# Patient Record
Sex: Female | Born: 1950
Health system: Southern US, Community
[De-identification: ages and names within clinical notes are randomized; demographics above are authoritative.]

## PROBLEM LIST (undated history)

## (undated) DIAGNOSIS — F419 Anxiety disorder, unspecified: Secondary | ICD-10-CM

## (undated) DIAGNOSIS — K219 Gastro-esophageal reflux disease without esophagitis: Secondary | ICD-10-CM

## (undated) DIAGNOSIS — K5790 Diverticulosis of intestine, part unspecified, without perforation or abscess without bleeding: Secondary | ICD-10-CM

## (undated) DIAGNOSIS — T7840XA Allergy, unspecified, initial encounter: Secondary | ICD-10-CM

## (undated) DIAGNOSIS — I493 Ventricular premature depolarization: Secondary | ICD-10-CM

## (undated) DIAGNOSIS — Z9289 Personal history of other medical treatment: Secondary | ICD-10-CM

## (undated) DIAGNOSIS — R002 Palpitations: Secondary | ICD-10-CM

## (undated) DIAGNOSIS — I341 Nonrheumatic mitral (valve) prolapse: Secondary | ICD-10-CM

## (undated) DIAGNOSIS — M810 Age-related osteoporosis without current pathological fracture: Secondary | ICD-10-CM

## (undated) DIAGNOSIS — K6289 Other specified diseases of anus and rectum: Secondary | ICD-10-CM

## (undated) DIAGNOSIS — M1990 Unspecified osteoarthritis, unspecified site: Secondary | ICD-10-CM

## (undated) DIAGNOSIS — M4800 Spinal stenosis, site unspecified: Secondary | ICD-10-CM

## (undated) DIAGNOSIS — R011 Cardiac murmur, unspecified: Secondary | ICD-10-CM

## (undated) DIAGNOSIS — M353 Polymyalgia rheumatica: Secondary | ICD-10-CM

## (undated) DIAGNOSIS — K589 Irritable bowel syndrome without diarrhea: Secondary | ICD-10-CM

## (undated) HISTORY — DX: Unspecified osteoarthritis, unspecified site: M19.90

## (undated) HISTORY — DX: Personal history of other medical treatment: Z92.89

## (undated) HISTORY — DX: Spinal stenosis, site unspecified: M48.00

## (undated) HISTORY — DX: Anxiety disorder, unspecified: F41.9

## (undated) HISTORY — DX: Palpitations: R00.2

## (undated) HISTORY — DX: Cardiac murmur, unspecified: R01.1

## (undated) HISTORY — PX: COLONOSCOPY: SHX174

## (undated) HISTORY — PX: DILATION AND CURETTAGE OF UTERUS: SHX78

## (undated) HISTORY — DX: Diverticulosis of intestine, part unspecified, without perforation or abscess without bleeding: K57.90

## (undated) HISTORY — DX: Other specified diseases of anus and rectum: K62.89

## (undated) HISTORY — DX: Age-related osteoporosis without current pathological fracture: M81.0

## (undated) HISTORY — DX: Polymyalgia rheumatica: M35.3

## (undated) HISTORY — DX: Irritable bowel syndrome, unspecified: K58.9

## (undated) HISTORY — DX: Gastro-esophageal reflux disease without esophagitis: K21.9

## (undated) HISTORY — DX: Allergy, unspecified, initial encounter: T78.40XA

## (undated) HISTORY — DX: Nonrheumatic mitral (valve) prolapse: I34.1

## (undated) HISTORY — PX: TUBAL LIGATION: SHX77

## (undated) HISTORY — PX: TONSILLECTOMY: SUR1361

## (undated) HISTORY — DX: Ventricular premature depolarization: I49.3

---

## 1998-11-25 ENCOUNTER — Other Ambulatory Visit: Admission: RE | Admit: 1998-11-25 | Discharge: 1998-11-25 | Payer: Self-pay | Admitting: Gynecology

## 1999-12-22 ENCOUNTER — Other Ambulatory Visit: Admission: RE | Admit: 1999-12-22 | Discharge: 1999-12-22 | Payer: Self-pay | Admitting: Gynecology

## 2001-03-20 ENCOUNTER — Other Ambulatory Visit: Admission: RE | Admit: 2001-03-20 | Discharge: 2001-03-20 | Payer: Self-pay | Admitting: Gynecology

## 2002-08-29 ENCOUNTER — Other Ambulatory Visit: Admission: RE | Admit: 2002-08-29 | Discharge: 2002-08-29 | Payer: Self-pay | Admitting: Gynecology

## 2003-08-20 ENCOUNTER — Encounter: Payer: Self-pay | Admitting: Cardiology

## 2003-09-01 ENCOUNTER — Ambulatory Visit (HOSPITAL_COMMUNITY): Admission: RE | Admit: 2003-09-01 | Discharge: 2003-09-01 | Payer: Self-pay | Admitting: Cardiology

## 2003-09-01 ENCOUNTER — Encounter: Payer: Self-pay | Admitting: Cardiology

## 2004-03-04 ENCOUNTER — Other Ambulatory Visit: Admission: RE | Admit: 2004-03-04 | Discharge: 2004-03-04 | Payer: Self-pay | Admitting: Gynecology

## 2005-03-09 ENCOUNTER — Other Ambulatory Visit: Admission: RE | Admit: 2005-03-09 | Discharge: 2005-03-09 | Payer: Self-pay | Admitting: Gynecology

## 2005-07-21 ENCOUNTER — Ambulatory Visit: Payer: Self-pay | Admitting: Cardiology

## 2005-12-31 ENCOUNTER — Ambulatory Visit: Payer: Self-pay | Admitting: Family Medicine

## 2006-02-23 ENCOUNTER — Other Ambulatory Visit: Admission: RE | Admit: 2006-02-23 | Discharge: 2006-02-23 | Payer: Self-pay | Admitting: Gynecology

## 2006-08-10 ENCOUNTER — Ambulatory Visit: Payer: Self-pay | Admitting: Cardiology

## 2006-09-28 ENCOUNTER — Ambulatory Visit: Payer: Self-pay | Admitting: Internal Medicine

## 2007-09-12 ENCOUNTER — Ambulatory Visit: Payer: Self-pay | Admitting: Cardiology

## 2007-11-16 LAB — CONVERTED CEMR LAB: Pap Smear: NORMAL

## 2008-03-13 ENCOUNTER — Ambulatory Visit: Payer: Self-pay | Admitting: Internal Medicine

## 2008-03-13 LAB — CONVERTED CEMR LAB
ALT: 14 units/L (ref 0–35)
AST: 24 units/L (ref 0–37)
Albumin: 3.6 g/dL (ref 3.5–5.2)
Alkaline Phosphatase: 28 units/L — ABNORMAL LOW (ref 39–117)
BUN: 14 mg/dL (ref 6–23)
Basophils Absolute: 0 10*3/uL (ref 0.0–0.1)
Basophils Relative: 0.1 % (ref 0.0–1.0)
Bilirubin Urine: NEGATIVE
Bilirubin, Direct: 0.1 mg/dL (ref 0.0–0.3)
Blood in Urine, dipstick: NEGATIVE
CO2: 34 meq/L — ABNORMAL HIGH (ref 19–32)
Calcium: 9.7 mg/dL (ref 8.4–10.5)
Chloride: 104 meq/L (ref 96–112)
Cholesterol: 188 mg/dL (ref 0–200)
Creatinine, Ser: 0.9 mg/dL (ref 0.4–1.2)
Eosinophils Absolute: 0.4 10*3/uL (ref 0.0–0.7)
Eosinophils Relative: 7.8 % — ABNORMAL HIGH (ref 0.0–5.0)
GFR calc Af Amer: 84 mL/min
GFR calc non Af Amer: 69 mL/min
Glucose, Bld: 84 mg/dL (ref 70–99)
Glucose, Urine, Semiquant: NEGATIVE
HCT: 39.1 % (ref 36.0–46.0)
HDL: 47.9 mg/dL (ref >39.0)
Hemoglobin: 13.3 g/dL (ref 12.0–15.0)
Ketones, urine, test strip: NEGATIVE
LDL Cholesterol: 119 mg/dL — ABNORMAL HIGH (ref 0–99)
Lymphocytes Relative: 24.2 % (ref 12.0–46.0)
MCHC: 34 g/dL (ref 30.0–36.0)
MCV: 90.1 fL (ref 78.0–100.0)
Monocytes Absolute: 0.3 10*3/uL (ref 0.1–1.0)
Monocytes Relative: 6.8 % (ref 3.0–12.0)
Neutro Abs: 2.7 10*3/uL (ref 1.4–7.7)
Neutrophils Relative %: 61.1 % (ref 43.0–77.0)
Nitrite: NEGATIVE
Platelets: 233 10*3/uL (ref 150–400)
Potassium: 4.3 meq/L (ref 3.5–5.1)
Protein, U semiquant: NEGATIVE
RBC: 4.33 M/uL (ref 3.87–5.11)
RDW: 12.1 % (ref 11.5–14.6)
Sodium: 142 meq/L (ref 135–145)
Specific Gravity, Urine: 1.01
TSH: 1.3 microintl units/mL (ref 0.35–5.50)
Total Bilirubin: 0.6 mg/dL (ref 0.3–1.2)
Total CHOL/HDL Ratio: 3.9
Total Protein: 6.8 g/dL (ref 6.0–8.3)
Triglycerides: 107 mg/dL (ref 0–149)
Urobilinogen, UA: 0.2
VLDL: 21 mg/dL (ref 0–40)
WBC Urine, dipstick: NEGATIVE
WBC: 4.5 10*3/uL (ref 4.5–10.5)
pH: 7

## 2008-03-20 ENCOUNTER — Other Ambulatory Visit: Admission: RE | Admit: 2008-03-20 | Discharge: 2008-03-20 | Payer: Self-pay | Admitting: Gynecology

## 2008-03-21 ENCOUNTER — Ambulatory Visit: Payer: Self-pay | Admitting: Internal Medicine

## 2008-03-21 DIAGNOSIS — Z8739 Personal history of other diseases of the musculoskeletal system and connective tissue: Secondary | ICD-10-CM | POA: Insufficient documentation

## 2008-04-24 ENCOUNTER — Ambulatory Visit: Payer: Self-pay | Admitting: Family Medicine

## 2008-04-24 ENCOUNTER — Encounter: Payer: Self-pay | Admitting: Internal Medicine

## 2008-05-03 ENCOUNTER — Telehealth: Payer: Self-pay | Admitting: Internal Medicine

## 2008-05-13 ENCOUNTER — Ambulatory Visit: Payer: Self-pay | Admitting: Internal Medicine

## 2008-05-22 ENCOUNTER — Ambulatory Visit: Payer: Self-pay | Admitting: Internal Medicine

## 2008-11-22 ENCOUNTER — Emergency Department (HOSPITAL_BASED_OUTPATIENT_CLINIC_OR_DEPARTMENT_OTHER): Admission: EM | Admit: 2008-11-22 | Discharge: 2008-11-22 | Payer: Self-pay | Admitting: Emergency Medicine

## 2009-01-08 ENCOUNTER — Encounter: Payer: Self-pay | Admitting: Internal Medicine

## 2009-03-01 DIAGNOSIS — Z8679 Personal history of other diseases of the circulatory system: Secondary | ICD-10-CM | POA: Insufficient documentation

## 2009-03-01 DIAGNOSIS — R002 Palpitations: Secondary | ICD-10-CM | POA: Insufficient documentation

## 2009-05-16 ENCOUNTER — Ambulatory Visit: Payer: Self-pay | Admitting: Cardiology

## 2009-06-18 ENCOUNTER — Ambulatory Visit: Payer: Self-pay | Admitting: Family Medicine

## 2009-07-02 ENCOUNTER — Telehealth: Payer: Self-pay | Admitting: Internal Medicine

## 2009-12-15 ENCOUNTER — Ambulatory Visit: Payer: Self-pay | Admitting: Internal Medicine

## 2009-12-31 ENCOUNTER — Encounter: Payer: Self-pay | Admitting: Internal Medicine

## 2010-02-12 ENCOUNTER — Encounter: Payer: Self-pay | Admitting: Internal Medicine

## 2010-10-15 ENCOUNTER — Encounter: Payer: Self-pay | Admitting: Internal Medicine

## 2010-11-02 ENCOUNTER — Ambulatory Visit: Payer: Self-pay | Admitting: Internal Medicine

## 2010-11-15 LAB — HM MAMMOGRAPHY: HM Mammogram: NORMAL

## 2010-12-15 NOTE — Letter (Signed)
Summary: [facility]   Imported By: Maryln Gottron 10/22/2010 09:47:50  _____________________________________________________________________  External Attachment:    Type:   Image     Comment:   External Document

## 2010-12-15 NOTE — Assessment & Plan Note (Signed)
Summary: sinus infection/dm   Vital Signs:  Patient profile:   60 year old female Weight:      129 pounds BMI:     23.30 Temp:     98 degrees F Pulse rate:   60 / minute Resp:     12 per minute BP sitting:   96 / 60  (left arm)  Vitals Entered By: Gladis Riffle, RN (December 15, 2009 11:03 AM) CC: c/o sinus infection, began with sore throat 1/27 and continues sore thrat with teet hurting and nasal congestion Is Patient Diabetic? No   Primary Care Provider:  Birdie Sons MD  CC:  c/o sinus infection and began with sore throat 1/27 and continues sore thrat with teet hurting and nasal congestion.  History of Present Illness: 4 day hx of URI sxs started with ST now with left sided sinus pressure and teeth pain ears ache.  no fever or chills sick contacts; none  All other systems reviewed and were negative   Preventive Screening-Counseling & Management  Alcohol-Tobacco     Smoking Status: never  Current Medications (verified): 1)  Metoprolol Succinate 50 Mg Xr24h-Tab (Metoprolol Succinate) .... 1/2 Tab Once Daily 2)  Zyrtec Allergy 10 Mg  Tabs (Cetirizine Hcl) .... One By Mouth Daily 3)  Aspirin 81 Mg  Tbec (Aspirin) .... Once Daily 4)  Claritin 10 Mg Tabs (Loratadine) .... As Needed 5)  Fish Oil 1000 Mg Caps (Omega-3 Fatty Acids) .[name].. 1 Cap Once Daily 6)  Vitamin D3 2000 Unit Caps (Cholecalciferol) .[name].. 1 Tab Once Daily 7)  Diclofenac Sodium 75 Mg Tbec (Diclofenac Sodium) .[name].. 1 Tab Once Daily 8)  Mucinex 600 Mg Xr12h-Tab (Guaifenesin) .... Once Daily  Allergies: 1)  ! Pcn 2)  ! Ceclor 3)  ! Sulfa  Comments:  Nurse/Medical Assistant: c/o sinus infection, began on 1/27 with sore throat--continues sore throat with nasal congestion and tooth pain  The patient's medications and allergies were reviewed with the patient and were updated in the Medication and Allergy Lists. Gladis Riffle, RN (December 15, 2009 11:09 AM)  Past History:  Past Medical History: Last updated:  03/01/2009 MITRAL VALVE PROLAPSE, HX OF (ICD-V12.50) PALPITATIONS (ICD-785.1) OTHER ACUTE REACTIONS TO STRESS (ICD-308.3) POLYMYALGIA RHEUMATICA (ICD-725) PREVENTIVE HEALTH CARE (ICD-V70.0) Allergies sinusitis   Past Surgical History: Last updated: 03/21/2008 Denies surgical history d and c age 12  Family History: Last updated: 03/22/2008 mother --MAI, OSA  Social History: Last updated: 03/01/2009 Never Smoked Regular exercise-yes Married..full time homemaker Alcohol Use - no  Risk Factors: Exercise: yes (07/05/2007)  Risk Factors: Smoking Status: never (12/15/2009)  Physical Exam  General:  alert and well-developed.   Head:  normocephalic and atraumatic.   Nose:  no external deformity and no external erythema.   Neck:  No deformities, masses, or tenderness noted. Lungs:  Normal respiratory effort, chest expands symmetrically. Lungs are clear to auscultation, no crackles or wheezes.   Impression & Recommendations:  Problem # 1:  ACUTE SINUSITIS, UNSPECIFIED (ICD-461.9) by history she says zpack has worked before (she knows this is not my favorite ABX) side effects discussed call for any increase or persistent sxs Her updated medication list for this problem includes:    Mucinex 600 Mg Xr12h-tab (Guaifenesin) ..... Once daily  Complete Medication List: 1)  Metoprolol Succinate 50 Mg Xr24h-tab (Metoprolol succinate) .... 1/2 tab once daily 2)  Zyrtec Allergy 10 Mg Tabs (Cetirizine hcl) .... One by mouth daily 3)  Aspirin 81 Mg Tbec (Aspirin) .... Once daily 4)  Fish Oil 1000 Mg Caps (Omega-3 fatty acids) .[name].. 1 cap once daily 5)  Vitamin D3 2000 Unit Caps (Cholecalciferol) .[name].. 1 tab once daily 6)  Diclofenac Sodium 75 Mg Tbec (Diclofenac sodium) .[name].. 1 tab once daily 7)  Mucinex 600 Mg Xr12h-tab (Guaifenesin) .... Once daily Prescriptions: AZITHROMYCIN 250 MG  TABS (AZITHROMYCIN) 2 by  mouth today and then 1 daily for 4 days  #6 x 0   Entered and Authorized  by:   Birdie Sons MD   Signed by:   Birdie Sons MD on 12/15/2009   Method used:   Electronically to        [facility] [phone]* (retail)       [address]       [place], [place]  [address]       Ph: [phone]       Fax: [phone]   RxID:   [id number]

## 2010-12-15 NOTE — Letter (Signed)
Summary: [facility]   Imported By: Maryln Gottron 01/12/2010 09:37:06  _____________________________________________________________________  External Attachment:    Type:   Image     Comment:   External Document

## 2010-12-17 NOTE — Assessment & Plan Note (Signed)
Summary: sinus inf/cjr   Vital Signs:  Patient profile:   60 year old female Height:      62.5 inches Weight:      129 pounds BMI:     23.30 O2 Sat:      98 % on Room air Temp:     98.5 degrees F oral Pulse rate:   72 / minute BP sitting:   100 / 70  (left arm) Cuff size:   regular  Vitals Entered By: Romualdo Bolk, CMA (AAMA) (November 02, 2010 10:30 AM)  O2 Flow:  Room air CC: Sinus pressure, congestion and coughing   Primary Care Provider:  Birdie Sons MD  CC:  Sinus pressure and congestion and coughing.  History of Present Illness: patient was declined as a work in for evaluation of upper respiratory symptoms.  No two-day history of sore throat nasal congestion drainage and cough productive of brown sputum. Thomas subjective fever but has experienced chills.  Had positive sick exposure approximately 1 week ago. denies shortness or breath or wheezing.  Is taking over-the-counter Mucinex.  Requests flu shot.  No other complaints.  No alleviating or exacerbating factors.  Preventive Screening-Counseling & Management  Alcohol-Tobacco     Smoking Status: never  Caffeine-Diet-Exercise     Does Patient Exercise: yes  Current Medications (verified): 1)  Metoprolol Succinate 50 Mg Xr24h-Tab (Metoprolol Succinate) .... 1/2 Tab Once Daily 2)  Zyrtec Allergy 10 Mg  Tabs (Cetirizine Hcl) .... One By Mouth Daily 3)  Aspirin 81 Mg  Tbec (Aspirin) .... Once Daily 4)  Fish Oil 1000 Mg Caps (Omega-3 Fatty Acids) .[name].. 1 Cap Once Daily 5)  Diclofenac Sodium 75 Mg Tbec (Diclofenac Sodium) .[name].. 1 Tab Once Daily 6)  Mucinex 600 Mg Xr12h-Tab (Guaifenesin) .... Once Daily 7)  Vitamin C 500 Mg  Tabs (Ascorbic Acid)  Allergies (verified): 1)  ! Pcn 2)  ! Ceclor 3)  ! Sulfa  Review of Systems      See HPI  Physical Exam  General:  Well-developed,well-nourished,in no acute distress; alert,appropriate and cooperative throughout examination Head:  Normocephalic and atraumatic  without obvious abnormalities. No apparent alopecia or balding. Eyes:  pupils equal, pupils round, corneas and lenses clear, and no injection.   Ears:  External ear exam shows no significant lesions or deformities.  Otoscopic examination reveals clear canals, tympanic membranes are intact bilaterally without bulging, retraction, inflammation or discharge. Hearing is grossly normal bilaterally. Nose:  no external deformity.  +nasal mucosal swelling and clear drainage noted.  Mouth:  Oral mucosa and oropharynx without lesions or exudates.  Teeth in good repair. Neck:  No deformities, masses, or tenderness noted. Lungs:  Normal respiratory effort, chest expands symmetrically. Lungs are clear to auscultation, no crackles or wheezes. Skin:  turgor normal, color normal, and no rashes.     Impression & Recommendations:  Problem # 1:  URI (ICD-465.9) Suspect possible viral etiology. Discussed otc symptomatic tx and increased fluids. Given prescription abx to hold. Begin if symptoms do not improve after total of 8-10 days. Followup if no improvement or worsening. Pt requested and was given flu vaccine. AF and without contraindication.  Complete Medication List: 1)  Metoprolol Succinate 50 Mg Xr24h-tab (Metoprolol succinate) .... 1/2 tab once daily 2)  Zyrtec Allergy 10 Mg Tabs (Cetirizine hcl) .... One by mouth daily 3)  Aspirin 81 Mg Tbec (Aspirin) .... Once daily 4)  Fish Oil 1000 Mg Caps (Omega-3 fatty acids) .[name].. 1 cap once daily 5)  Diclofenac Sodium 75 Mg Tbec (Diclofenac sodium) .[name].. 1 tab once daily 6)  Mucinex 600 Mg Xr12h-tab (Guaifenesin) .... Once daily 7)  Vitamin C 500 Mg Tabs (Ascorbic acid) 8)  Zithromax Z-pak 250 Mg Tabs (Azithromycin) .... As directed Prescriptions: ZITHROMAX Z-PAK 250 MG TABS (AZITHROMYCIN) as directed  #1 x 0   Entered and Authorized by:   Edwyna Perfect MD   Signed by:   Edwyna Perfect MD on 11/02/2010   Method used:   Print then Give to Patient   RxID:    [id number]    Orders Added: 1)  Est. Patient Level III [56213]

## 2011-01-20 ENCOUNTER — Encounter (INDEPENDENT_AMBULATORY_CARE_PROVIDER_SITE_OTHER): Payer: Self-pay | Admitting: *Deleted

## 2011-01-20 ENCOUNTER — Other Ambulatory Visit: Payer: Self-pay

## 2011-01-20 ENCOUNTER — Other Ambulatory Visit: Payer: Self-pay | Admitting: Internal Medicine

## 2011-01-20 DIAGNOSIS — Z0000 Encounter for general adult medical examination without abnormal findings: Secondary | ICD-10-CM

## 2011-01-20 DIAGNOSIS — E785 Hyperlipidemia, unspecified: Secondary | ICD-10-CM

## 2011-01-20 LAB — CBC WITH DIFFERENTIAL/PLATELET
Basophils Absolute: 0 10*3/uL (ref 0.0–0.1)
Basophils Relative: 0.4 % (ref 0.0–3.0)
Eosinophils Absolute: 0.2 10*3/uL (ref 0.0–0.7)
Eosinophils Relative: 2.5 % (ref 0.0–5.0)
HCT: 39.1 % (ref 36.0–46.0)
Hemoglobin: 13.2 g/dL (ref 12.0–15.0)
Lymphocytes Relative: 21.6 % (ref 12.0–46.0)
Lymphs Abs: 1.4 10*3/uL (ref 0.7–4.0)
MCHC: 33.8 g/dL (ref 30.0–36.0)
MCV: 93.2 fl (ref 78.0–100.0)
Monocytes Absolute: 0.6 10*3/uL (ref 0.1–1.0)
Monocytes Relative: 8.6 % (ref 3.0–12.0)
Neutro Abs: 4.4 10*3/uL (ref 1.4–7.7)
Neutrophils Relative %: 66.9 % (ref 43.0–77.0)
Platelets: 271 10*3/uL (ref 150.0–400.0)
RBC: 4.19 Mil/uL (ref 3.87–5.11)
RDW: 14.4 % (ref 11.5–14.6)
WBC: 6.6 10*3/uL (ref 4.5–10.5)

## 2011-01-20 LAB — BASIC METABOLIC PANEL
BUN: 15 mg/dL (ref 6–23)
Calcium: 9.8 mg/dL (ref 8.4–10.5)
Creatinine, Ser: 0.7 mg/dL (ref 0.4–1.2)
GFR: 96.29 mL/min (ref >60.00)
Glucose, Bld: 85 mg/dL (ref 70–99)
Potassium: 4.7 mEq/L (ref 3.5–5.1)

## 2011-01-20 LAB — HEPATIC FUNCTION PANEL
AST: 28 U/L (ref 0–37)
Albumin: 3.9 g/dL (ref 3.5–5.2)
Total Bilirubin: 0.6 mg/dL (ref 0.3–1.2)

## 2011-01-20 LAB — URINALYSIS
Ketones, ur: NEGATIVE
Leukocytes, UA: NEGATIVE
Nitrite: NEGATIVE
Specific Gravity, Urine: 1.02 (ref 1.000–1.030)
Urobilinogen, UA: 0.2 (ref 0.0–1.0)

## 2011-01-20 LAB — LIPID PANEL
Cholesterol: 232 mg/dL — ABNORMAL HIGH (ref 0–200)
HDL: 61.5 mg/dL (ref >39.00)
Triglycerides: 145 mg/dL (ref 0.0–149.0)
VLDL: 29 mg/dL (ref 0.0–40.0)

## 2011-01-20 LAB — TSH: TSH: 1.24 u[IU]/mL (ref 0.35–5.50)

## 2011-01-27 ENCOUNTER — Encounter: Payer: Self-pay | Admitting: Internal Medicine

## 2011-02-02 ENCOUNTER — Encounter: Payer: Self-pay | Admitting: Internal Medicine

## 2011-02-03 ENCOUNTER — Encounter: Payer: Self-pay | Admitting: Internal Medicine

## 2011-02-03 ENCOUNTER — Ambulatory Visit (INDEPENDENT_AMBULATORY_CARE_PROVIDER_SITE_OTHER): Payer: BC Managed Care – PPO | Admitting: Internal Medicine

## 2011-02-03 VITALS — BP 118/76 | HR 64 | Temp 97.8°F | Ht 63.5 in | Wt 127.0 lb

## 2011-02-03 DIAGNOSIS — I059 Rheumatic mitral valve disease, unspecified: Secondary | ICD-10-CM

## 2011-02-03 DIAGNOSIS — Z0000 Encounter for general adult medical examination without abnormal findings: Secondary | ICD-10-CM

## 2011-02-03 DIAGNOSIS — I341 Nonrheumatic mitral (valve) prolapse: Secondary | ICD-10-CM

## 2011-02-03 MED ORDER — METOPROLOL SUCCINATE ER 50 MG PO TB24: 50.0000 mg | ORAL_TABLET | Freq: Every day | ORAL | Status: DC

## 2011-02-03 NOTE — Progress Notes (Signed)
  Subjective:    Patient ID: Brenda Hudson, female    DOB: 1951-05-29, 60 y.o.   MRN: [id number]  HPI  CPX  Back pain---has had several injections---? Relief  She describes waves of "fatigue". Has periods where she feels like she could "fall asleep anytime".  Past Medical History  Diagnosis Date  . MVP (mitral valve prolapse)   . Palpitations   . Polymyalgia rheumatica   . Allergy   . Anxiety    Past Surgical History  Procedure Date  . Dilation and curettage of uterus     reports that she has never smoked. She does not have any smokeless tobacco history on file. She reports that she does not drink alcohol. Her drug history not on file. family history includes Sleep apnea in her mother. Allergies  Allergen Reactions  . Cefaclor     REACTION: rash  . Penicillins     REACTION: rash  . Sulfonamide Derivatives     REACTION: swelling     Review of Systems  patient denies chest pain, shortness of breath, orthopnea. Denies lower extremity edema, abdominal pain, change in appetite, change in bowel movements. Patient denies rashes, musculoskeletal complaints. No other specific complaints in a complete review of systems.      Objective:   Physical Exam  Well-developed well-nourished female in no acute distress. HEENT exam atraumatic, normocephalic, extraocular muscles are intact. Neck is supple. No jugular venous distention no thyromegaly. Chest clear to auscultation without increased work of breathing. Cardiac exam S1 and S2 are regular. Abdominal exam active bowel sounds, soft, nontender. Extremities no edema. Neurologic exam she is alert without any motor sensory deficits. Gait is normal.        Assessment & Plan:

## 2011-02-10 ENCOUNTER — Encounter: Payer: Self-pay | Admitting: Gastroenterology

## 2011-02-25 ENCOUNTER — Encounter: Payer: Self-pay | Admitting: Internal Medicine

## 2011-02-26 ENCOUNTER — Encounter: Payer: Self-pay | Admitting: *Deleted

## 2011-03-01 ENCOUNTER — Ambulatory Visit (INDEPENDENT_AMBULATORY_CARE_PROVIDER_SITE_OTHER): Payer: BC Managed Care – PPO | Admitting: Cardiology

## 2011-03-01 ENCOUNTER — Telehealth: Payer: Self-pay | Admitting: Cardiology

## 2011-03-01 ENCOUNTER — Encounter: Payer: Self-pay | Admitting: Cardiology

## 2011-03-01 VITALS — BP 115/70 | HR 51 | Resp 12 | Ht 63.0 in | Wt 126.0 lb

## 2011-03-01 DIAGNOSIS — R002 Palpitations: Secondary | ICD-10-CM

## 2011-03-01 DIAGNOSIS — Z8679 Personal history of other diseases of the circulatory system: Secondary | ICD-10-CM

## 2011-03-01 MED ORDER — METOPROLOL SUCCINATE ER 50 MG PO TB24: 50.0000 mg | ORAL_TABLET | Freq: Every day | ORAL | Status: DC

## 2011-03-01 NOTE — Progress Notes (Signed)
   Patient ID: Brenda Hudson, female    DOB: May 15, 1951, 60 y.o.   MRN: [id number]  HPI  Brenda Hudson comes in today for E and M of her palpitations. They are still mostly well controlled by Toprol and exercise. She has no angina or chest pain. She is running about 16-20 miles a week. She is starting to have some back issues. I have encouraged her to walk.  I reviewed her lipids with her. She has a high HDL and a low TC/HDL ratio. No indication for pharmacologic treatment.  EKG shows SB otherwise normal.    Review of Systems  All other systems reviewed and are negative.      Physical Exam  Nursing note and vitals reviewed. Constitutional: She is oriented to person, place, and time. She appears well-developed and well-nourished.  HENT:  Head: Atraumatic.  Eyes: EOM are normal. Pupils are equal, round, and reactive to light.  Neck: No JVD present. No tracheal deviation present. No thyromegaly present.  Cardiovascular: Normal rate, regular rhythm, normal heart sounds and intact distal pulses.   No murmur heard. Pulmonary/Chest: Effort normal and breath sounds normal.  Abdominal: Soft. Bowel sounds are normal.  Musculoskeletal: She exhibits no edema.  Neurological: She is alert and oriented to person, place, and time.  Skin: Skin is warm and dry.  Psychiatric: She has a normal mood and affect.

## 2011-03-01 NOTE — Patient Instructions (Signed)
Your physician recommends that you schedule a follow-up appointment in: 1 year with Dr. Wall  

## 2011-03-01 NOTE — Telephone Encounter (Signed)
[facility] re [facility]

## 2011-03-02 NOTE — Telephone Encounter (Signed)
Clarified dose

## 2011-03-30 NOTE — Assessment & Plan Note (Signed)
[facility]                            CARDIOLOGY OFFICE NOTE   NAME:Brenda Hudson, Brenda Hudson                   MRN:          [id number]  DATE:09/12/2007                            DOB:          Jul 12, 1951    HISTORY:  Brenda Hudson returns today for further management of her history  of mitral valve prolapse and tachy palpitations.  She remains on Toprol  XL 50 mg daily.  She has intermittent palpitations but they are not  prolonged or life-changing.   She continues to be extremely active, riding horses and running.  She  generally runs three to four miles at a time.  She has had two episodes  of chest tightness, going up into her neck.  There were no other  symptoms.  This seems to happen in really hot weather.  It does not  happen most of the time.  In fact, it happens very rarely.   She does not recall what her lipids are.  They have not been checked in  four years.  I have advised her to have them checked.   CURRENT MEDICATIONS:  1. Aspirin 81 mg daily.  2. Fish oil one p.o. twice daily.  3. Zyrtec.  4. Multivitamin.  5. Toprol XL 50 mg daily.   PHYSICAL EXAMINATION:  VITAL SIGNS:  Blood pressure today 110/78, pulse  48, sinus bradycardia.  Her electrocardiogram is stable.  Weight 128  pounds.  HEENT:  Unchanged.  NECK:  Carotid upstrokes are equal bilaterally without bruits.  No  jugular venous distention.  Thyroid is not enlarged.  Trachea is  midline.  LUNGS:  Clear.  HEART:  A non-displaced PMI.  She has normal S1 and S2 without click.  ABDOMEN:  Soft, good bowel sounds.  EXTREMITIES:  No edema.  Pulses intact.  NEUROLOGIC:  Intact.   [name] is doing extremely well.  These two episodes of chest  tightness seem to be weather or humidity-related.  I have advised her  that if those start happening on a more regular basis, to please let me  know.  We could do a stress test, but I do not think that is indicated  at present.  She does need  her fasting lipids checked.  We have renewed  her Toprol for one year.   FOLLOWUP:  I will see her back in one year.    Thomas C. Daleen Squibb, MD, [facility]  Electronically Signed   TCW/MedQ  DD: 09/12/2007  DT: 09/12/2007  Job #: [id number]

## 2011-04-02 NOTE — Assessment & Plan Note (Signed)
[facility]                              CARDIOLOGY OFFICE NOTE   NAME:Hudson, Brenda BOHL                   MRN:          [id number]  DATE:08/10/2006                            DOB:          04-14-1951    Brenda Hudson returns today for management of her mitral valve prolapse and  palpitations. She has done well on Toprol-XL 50 mg a day and needs it  renewed. She has occasional palpitations that are unpredictable throughout  the day and night, but nothing that keeps her from being extremely active.  She has gotten into riding horses with her daughter and seems to be  thoroughly enjoying it.   She has no other complaints. She has not had a regular visit with her  primary doctor in some time, and I have encouraged her to get up with Dr.  Cato Mulligan.   Her only medications are:  1. Aspirin 81 mg every night.  2. Fish oil b.i.d.  3. Calcium.  4. Zyrtec 10 mg a day.  5. Toprol-XL 50 mg a day.   She exercises on a regular basis.   PHYSICAL EXAMINATION:  VITAL SIGNS:  Her blood pressure is 104/68, pulse 57  in sinus brady. EKG is normal otherwise. Weight is stable at 124.  NECK:  Carotids are full. There is no JVD. Thyroid is not enlarged. There  are no bruits.  LUNGS:  Clear.  HEART:  Reveals a soft S1 and S2. There is no substantial murmur. S2 splits.  ABDOMEN:  Soft, good bowel sounds. No midline bruit. There is no tenderness.  There is no distention.  EXTREMITIES:  Reveal no clubbing, cyanosis, or edema. Pulses are intact.  NEUROLOGICAL:  Intact.   ASSESSMENT AND PLAN:  Brenda Hudson is doing well. I have no changes in her  program. We renewed her Toprol. We will see her back in a year. I advised  her not to have SB prophylaxis based on the new guidelines going forward.      Thomas C. Daleen Squibb, MD, [facility]     TCW/MedQ  DD:  08/10/2006 DT:  08/12/2006 Job #:  [id number]   cc:   Valetta Mole. Swords, MD

## 2011-04-08 ENCOUNTER — Ambulatory Visit (INDEPENDENT_AMBULATORY_CARE_PROVIDER_SITE_OTHER): Payer: BC Managed Care – PPO | Admitting: Family Medicine

## 2011-04-08 ENCOUNTER — Encounter: Payer: Self-pay | Admitting: Family Medicine

## 2011-04-08 VITALS — BP 92/60 | Temp 97.6°F | Wt 126.0 lb

## 2011-04-08 DIAGNOSIS — H6120 Impacted cerumen, unspecified ear: Secondary | ICD-10-CM

## 2011-04-08 NOTE — Progress Notes (Signed)
  Subjective:    Patient ID: Brenda Hudson, female    DOB: Sep 25, 1951, 60 y.o.   MRN: [id number]  HPI Patient seen with bilateral ear pressure. Present for several weeks. Decreased hearing. Denies any vertigo. No ear drainage. No fever or chills. Similar problem with cerumen impaction past. No other active problems at this time. She has history of mitral valve prolapse and takes Toprol-XL for that.   Review of Systems  Constitutional: Negative for fever and chills.  HENT: Positive for hearing loss. Negative for ear pain and congestion.   Eyes: Negative for pain and discharge.       Objective:   Physical Exam  Constitutional: She appears well-developed and well-nourished.  HENT:  Mouth/Throat: Oropharynx is clear and moist.       Bilateral cerumen impactions  Neck: Neck supple.  Cardiovascular: Normal rate, regular rhythm and normal heart sounds.   Pulmonary/Chest: Effort normal and breath sounds normal. No respiratory distress. She has no wheezes. She has no rales.  Lymphadenopathy:    She has no cervical adenopathy.          Assessment & Plan:  Bilateral cerumen impactions. Combination of irrigation and curette with removal of wax from both ears.  TMS are normal.

## 2011-04-08 NOTE — Patient Instructions (Signed)
Follow up as needed

## 2011-09-13 ENCOUNTER — Ambulatory Visit (INDEPENDENT_AMBULATORY_CARE_PROVIDER_SITE_OTHER): Payer: BC Managed Care – PPO | Admitting: Family Medicine

## 2011-09-13 ENCOUNTER — Encounter: Payer: Self-pay | Admitting: Family Medicine

## 2011-09-13 VITALS — BP 110/78 | Temp 98.3°F | Wt 125.0 lb

## 2011-09-13 DIAGNOSIS — M940 Chondrocostal junction syndrome [Tietze]: Secondary | ICD-10-CM

## 2011-09-13 MED ORDER — PREDNISONE 10 MG PO TABS: ORAL_TABLET | ORAL | Status: DC

## 2011-09-13 NOTE — Patient Instructions (Signed)
Costochondritis Costochondritis (Tietze syndrome), or costochondral separation, is a swelling and irritation (inflammation) of the tissue (cartilage) that connects your ribs with your breastbone (sternum). It may occur on its own (spontaneously), through damage caused by an accident (trauma), or simply from coughing or minor exercise. It may take up to 6 weeks to get better and longer if you are unable to be conservative in your activities. HOME CARE INSTRUCTIONS   Avoid exhausting physical activity. Try not to strain your ribs during normal activity. This would include any activities using chest, belly (abdominal), and side muscles, especially if heavy weights are used.   Use ice for 15 to 20 minutes per hour while awake for the first 2 days. Place the ice in a plastic bag, and place a towel between the bag of ice and your skin.   Only take over-the-counter or prescription medicines for pain, discomfort, or fever as directed by your caregiver.  SEEK IMMEDIATE MEDICAL CARE IF:   Your pain increases or you are very uncomfortable.   You have a fever.   You develop difficulty with your breathing.   You cough up blood.   You develop worse chest pains, shortness of breath, sweating, or vomiting.   You develop new, unexplained problems (symptoms).  MAKE SURE YOU:   Understand these instructions.   Will watch your condition.   Will get help right away if you are not doing well or get worse.  Document Released: 08/11/2005 Document Revised: 07/14/2011 Document Reviewed: 06/19/2008 Exit[employer] Patient Information 2012 [employer]. 

## 2011-09-13 NOTE — Progress Notes (Signed)
  Subjective:    Patient ID: Brenda Hudson, female    DOB: [birth year], 60 y.o.   MRN: [id number]  HPI  Acute visit. 3 day history of chest wall pain right greater than left. Similar episode 2 years ago diagnosed as costochondritis. Patient denies injury. Pain is deep ache which is constant. Worse with movement such as lifting up shoulders or twisting. Also sore to touch. Severity is 6-7/10.  Denies any cough or dyspnea. No fever. Some pain with deep breathing. No hemoptysis. Takes diclofenac at baseline without much improvement. Previously responded to prednisone. Tylenol without relief.  Past Medical History  Diagnosis Date  . MVP (mitral valve prolapse)   . Palpitations   . Polymyalgia rheumatica   . Allergy   . Anxiety    Past Surgical History  Procedure Date  . Dilation and curettage of uterus     reports that she has never smoked. She does not have any smokeless tobacco history on file. She reports that she does not drink alcohol. Her drug history not on file. family history includes Sleep apnea in her mother. Allergies  Allergen Reactions  . Cefaclor     REACTION: rash  . Penicillins     REACTION: rash  . Sulfonamide Derivatives     REACTION: swelling     Review of Systems  Constitutional: Negative for fever, chills and unexpected weight change.  HENT: Negative for trouble swallowing.   Respiratory: Negative for cough, shortness of breath and wheezing.   Cardiovascular: Positive for chest pain. Negative for palpitations and leg swelling.  Gastrointestinal: Negative for nausea, vomiting, abdominal pain and blood in stool.  Musculoskeletal: Negative for back pain.  Neurological: Negative for dizziness and weakness.  Hematological: Negative for adenopathy. Does not bruise/bleed easily.       Objective:   Physical Exam  Constitutional: She appears well-developed and well-nourished.  Neck: Neck supple. No thyromegaly present.  Cardiovascular: Normal rate, regular  rhythm and normal heart sounds.   No murmur heard. Pulmonary/Chest: Effort normal and breath sounds normal. No respiratory distress. She has no wheezes. She has no rales.  Musculoskeletal:       Tender palpation right chest wall around the third and fourth intercostal space  Lymphadenopathy:    She has no cervical adenopathy.  Skin: No rash noted.          Assessment & Plan:  Chest wall pain. Suspect costochondritis. Hold diclofenac for now. Start prednisone taper at 60 mg daily. Touch base 2-3 days if no improvement

## 2011-12-20 ENCOUNTER — Ambulatory Visit (INDEPENDENT_AMBULATORY_CARE_PROVIDER_SITE_OTHER): Payer: BC Managed Care – PPO | Admitting: Internal Medicine

## 2011-12-20 VITALS — BP 114/64 | HR 62 | Temp 98.0°F | Wt 124.0 lb

## 2011-12-20 DIAGNOSIS — R079 Chest pain, unspecified: Secondary | ICD-10-CM

## 2011-12-20 NOTE — Progress Notes (Signed)
Patient ID: Brenda Hudson, female   DOB: 1951-08-08, 61 y.o.   MRN: [id number] Chronic back pain---uses voltaren and neurontin.  New complaint of chest pain. Chest pain is frequent but waxes and wanes, not exertional or reproducible. Chest pain can radiate to right shoulder and subscapular area. She also notes some achiness uner her arms (axilla). She has known "palptations"  Past Medical History  Diagnosis Date  . MVP (mitral valve prolapse)   . Palpitations   . Polymyalgia rheumatica   . Allergy   . Anxiety     History   Social History  . Marital Status: Married    Spouse Name: N/A    Number of Children: N/A  . Years of Education: N/A   Occupational History  . Not on file.   Social History Main Topics  . Smoking status: Never Smoker   . Smokeless tobacco: Not on file  . Alcohol Use: No  . Drug Use:   . Sexually Active:    Other Topics Concern  . Not on file   Social History Narrative  . No narrative on file    Past Surgical History  Procedure Date  . Dilation and curettage of uterus     Family History  Problem Relation Age of Onset  . Sleep apnea Mother     Allergies  Allergen Reactions  . Cefaclor     REACTION: rash  . Penicillins     REACTION: rash  . Sulfonamide Derivatives     REACTION: swelling    Current Outpatient Prescriptions on File Prior to Visit  Medication Sig Dispense Refill  . acetaminophen (TYLENOL) 325 MG tablet Take 650 mg by mouth every 6 (six) hours as needed.        . cetirizine (ZYRTEC) 10 MG tablet Take 10 mg by mouth daily.        . diclofenac (VOLTAREN) 75 MG EC tablet Take 75 mg by mouth 2 (two) times daily.       [name] gabapentin (NEURONTIN) 300 MG capsule Take 300 mg by mouth daily.        . metoprolol (TOPROL-XL) 50 MG 24 hr tablet 1/2 tab po qd          patient denies chest pain, shortness of breath, orthopnea. Denies lower extremity edema, abdominal pain, change in appetite, change in bowel movements. Patient denies  rashes, musculoskeletal complaints. No other specific complaints in a complete review of systems.   BP 114/64  Pulse 62  Temp(Src) 98 F (36.7 C) (Oral)  Wt 124 lb (56.246 kg)  SpO2 98% thin well-nourished female in no acute distress. HEENT exam atraumatic, normocephalic, extraocular muscles are intact. Neck is supple. No jugular venous distention no thyromegaly. Chest clear to auscultation without increased work of breathing. Pain to palpation of chest wall and of axillae.  Cardiac exam S1 and S2 are regular. Abdominal exam active bowel sounds, soft, nontender. Extremities no edema.  Chest pain- Reviewed dr burcette's note from October.  Note hx of PMR Problem list has documented MVP---i do not have echo from early 2000s---request echo report  Check labs today---may need imaging study

## 2011-12-21 ENCOUNTER — Encounter: Payer: Self-pay | Admitting: Internal Medicine

## 2011-12-21 LAB — C-REACTIVE PROTEIN: CRP: 0.1 mg/dL (ref <0.60)

## 2011-12-21 LAB — D-DIMER, QUANTITATIVE: D-Dimer, Quant: 0.26 ug/mL-FEU (ref 0.00–0.48)

## 2011-12-23 ENCOUNTER — Other Ambulatory Visit: Payer: Self-pay | Admitting: Internal Medicine

## 2011-12-23 NOTE — Progress Notes (Signed)
Addended by: Alfred Levins D on: 12/23/2011 10:30 AM   Modules accepted: Orders

## 2011-12-24 ENCOUNTER — Ambulatory Visit (INDEPENDENT_AMBULATORY_CARE_PROVIDER_SITE_OTHER)
Admission: RE | Admit: 2011-12-24 | Discharge: 2011-12-24 | Disposition: A | Discharge status: Home / Self Care | Payer: BC Managed Care – PPO | Source: Ambulatory Visit | Attending: Internal Medicine | Admitting: Internal Medicine

## 2011-12-24 DIAGNOSIS — R079 Chest pain, unspecified: Secondary | ICD-10-CM

## 2011-12-28 NOTE — Progress Notes (Signed)
No. Stop the diclofenac and remove from medication list

## 2011-12-30 ENCOUNTER — Encounter: Payer: Self-pay | Admitting: Gastroenterology

## 2012-01-19 ENCOUNTER — Ambulatory Visit (INDEPENDENT_AMBULATORY_CARE_PROVIDER_SITE_OTHER): Payer: BC Managed Care – PPO | Admitting: Cardiology

## 2012-01-19 ENCOUNTER — Encounter: Payer: Self-pay | Admitting: Cardiology

## 2012-01-19 VITALS — BP 116/78 | HR 52 | Ht 63.0 in | Wt 124.0 lb

## 2012-01-19 DIAGNOSIS — R002 Palpitations: Secondary | ICD-10-CM

## 2012-01-19 DIAGNOSIS — Z8679 Personal history of other diseases of the circulatory system: Secondary | ICD-10-CM

## 2012-01-19 NOTE — Progress Notes (Signed)
HPI Brenda Hudson comes in today for further evaluation and management of her history of mild mitral prolapse with mitral valve thickening and minimal mitral regurgitation by echocardiography in 2004. She is having some intermittent chest pain described as an ache of the right upper chest and right shoulder. It is not exertional. It is better with exercise. He still has intermittent palpitations that are well controlled with metoprolol.  She still exercises on a regular basis. She denies any angina or ischemic symptoms. Recent blood work showed a very high HDL. She has no conventional risk factors.  Past Medical History  Diagnosis Date  . MVP (mitral valve prolapse)   . Palpitations   . Polymyalgia rheumatica   . Allergy   . Anxiety     Current Outpatient Prescriptions  Medication Sig Dispense Refill  . acetaminophen (TYLENOL) 325 MG tablet Take 650 mg by mouth every 6 (six) hours as needed.        . cetirizine (ZYRTEC) 10 MG tablet Take 10 mg by mouth daily.        . diclofenac (VOLTAREN) 75 MG EC tablet Take 75 mg by mouth 2 (two) times daily.       [name] gabapentin (NEURONTIN) 300 MG capsule Take 300 mg by mouth daily.        . metoprolol (TOPROL-XL) 50 MG 24 hr tablet 1/2 tab po qd         Allergies  Allergen Reactions  . Cefaclor     REACTION: rash  . Penicillins     REACTION: rash  . Sulfonamide Derivatives     REACTION: swelling    Family History  Problem Relation Age of Onset  . Sleep apnea Mother     History   Social History  . Marital Status: Married    Spouse Name: N/A    Number of Children: N/A  . Years of Education: N/A   Occupational History  . Not on file.   Social History Main Topics  . Smoking status: Never Smoker   . Smokeless tobacco: Not on file  . Alcohol Use: No  . Drug Use:   . Sexually Active:    Other Topics Concern  . Not on file   Social History Narrative  . No narrative on file    ROS ALL NEGATIVE EXCEPT THOSE NOTED IN  HPI  PE  General Appearance: well developed, well nourished in no acute distress HEENT: symmetrical face, PERRLA, good dentition  Neck: no JVD, thyromegaly, or adenopathy, trachea midline Chest: symmetric without deformity Cardiac: PMI non-displaced, RRR, normal S1, S2,  Soft systolic murmur at the apex, soft click Lung: clear to ausculation and percussion Vascular: all pulses full without bruits  Abdominal: nondistended, nontender, good bowel sounds, no HSM, no bruits Extremities: no cyanosis, clubbing or edema, no sign of DVT, no varicosities  Skin: normal color, no rashes Neuro: alert and oriented x 3, non-focal Pysch: normal affect  EKG Sinus bradycardia, low voltage QRS, no ST segment changes. BMET    Component Value Date/Time   NA 144 01/20/2011 0815   K 4.7 01/20/2011 0815   CL 103 01/20/2011 0815   CO2 34* 01/20/2011 0815   GLUCOSE 85 01/20/2011 0815   BUN 15 01/20/2011 0815   CREATININE 0.7 01/20/2011 0815   CALCIUM 9.8 01/20/2011 0815   GFRNONAA 69 03/13/2008 0857   GFRAA 84 03/13/2008 0857    Lipid Panel     Component Value Date/Time   CHOL 232* 01/20/2011 0815  TRIG 145.0 01/20/2011 0815   HDL 61.50 01/20/2011 0815   CHOLHDL 4 01/20/2011 0815   VLDL 29.0 01/20/2011 0815   LDLCALC 119* 03/13/2008 0857    CBC    Component Value Date/Time   WBC 6.6 01/20/2011 0815   RBC 4.19 01/20/2011 0815   HGB 13.2 01/20/2011 0815   HCT 39.1 01/20/2011 0815   PLT 271.0 01/20/2011 0815   MCV 93.2 01/20/2011 0815   MCHC 33.8 01/20/2011 0815   RDW 14.4 01/20/2011 0815   LYMPHSABS 1.4 01/20/2011 0815   MONOABS 0.6 01/20/2011 0815   EOSABS 0.2 01/20/2011 0815   BASOSABS 0.0 01/20/2011 0815

## 2012-01-19 NOTE — Patient Instructions (Signed)
Your physician recommends that you continue on your current medications as directed. Please refer to the Current Medication list given to you today.   Your physician wants you to follow-up in: 1 year with Dr. Wall. You will receive a reminder letter in the mail two months in advance. If you don't receive a letter, please call our office to schedule the follow-up appointment.  

## 2012-01-19 NOTE — Assessment & Plan Note (Signed)
Symptoms of chest discomfort, atypical, and palpitations consistent with mild mitral valve prolapse. Continue beta blocker.

## 2012-03-02 ENCOUNTER — Other Ambulatory Visit: Payer: Self-pay | Admitting: Cardiology

## 2012-10-09 ENCOUNTER — Ambulatory Visit (INDEPENDENT_AMBULATORY_CARE_PROVIDER_SITE_OTHER): Payer: BC Managed Care – PPO | Admitting: Physician Assistant

## 2012-10-09 ENCOUNTER — Encounter: Payer: Self-pay | Admitting: Physician Assistant

## 2012-10-09 VITALS — BP 117/71 | HR 59 | Ht 63.5 in | Wt 121.0 lb

## 2012-10-09 DIAGNOSIS — R002 Palpitations: Secondary | ICD-10-CM | POA: Insufficient documentation

## 2012-10-09 DIAGNOSIS — R079 Chest pain, unspecified: Secondary | ICD-10-CM

## 2012-10-09 NOTE — Progress Notes (Signed)
[address] [place], [place]  [address] Phone: [phone], Fax:  [phone]  Date:  10/09/2012   Name:  Brenda Hudson   DOB:  Jun 05, 1951   MRN:  [id number]  PCP:  Judie Petit, MD  Primary Cardiologist:  Dr. Valera Castle  Primary Electrophysiologist:  None    History of Present Illness: Brenda Hudson is a 61 y.o. female who returns for evaluation of palpitations.  She has a hx of MVP with assoc MR.  Echo in 2004 with normal LVF and trace MR.  Last seen by Dr. Valera Castle in 01/2012.   Over the last several weeks, she has had more frequent palpitations.  She describes them as a fluttering or a thumping. She's not sure that her heart rate is increased. She has felt lightheaded at times. She denies syncope. She's not certain that she's had near-syncope. The palpitations often keep her awake at night. She has occasional chest discomfort. She describes this as pressure and heaviness. She's had these for years. She does feel pain up into her jaw at times. She's not certain if this is related to the chest pain or not. She denies significant dyspnea. She denies orthopnea, PND or edema. She is quite active. She does hike. She has noted increased palpitations with going up hill.  Labs (3/12):    K 4.7, creatinine 0.7, ALT 24, dLDL 150, Hgb 13.2, TSH 1.24  Wt Readings from Last 3 Encounters:  10/09/12 121 lb (54.885 kg)  01/19/12 124 lb (56.246 kg)  12/20/11 124 lb (56.246 kg)     Past Medical History  Diagnosis Date  . MVP (mitral valve prolapse)   . Palpitations   . Polymyalgia rheumatica   . Allergy   . Anxiety     Current Outpatient Prescriptions  Medication Sig Dispense Refill  . metoprolol succinate (TOPROL-XL) 50 MG 24 hr tablet TAKE 1/2 TABLET BY MOUTH EVERY DAY  15 tablet  10  . naproxen (NAPROSYN) 250 MG tablet Take 250 mg by mouth 2 (two) times daily with a meal.      . acetaminophen (TYLENOL) 325 MG tablet Take 650 mg by mouth every  6 (six) hours as needed.        . cetirizine (ZYRTEC) 10 MG tablet Take 10 mg by mouth daily.        . diclofenac (VOLTAREN) 75 MG EC tablet Take 75 mg by mouth 2 (two) times daily.       [name] gabapentin (NEURONTIN) 300 MG capsule Take 300 mg by mouth daily.          Allergies: Allergies  Allergen Reactions  . Cefaclor     REACTION: rash  . Penicillins     REACTION: rash  . Sulfonamide Derivatives     REACTION: swelling    Social History:  The patient  reports that she has never smoked. She does not have any smokeless tobacco history on file. She reports that she does not drink alcohol.   ROS:  Please see the history of present illness.   She notes fatigue. She notes increased frequency in bowel movements associated with nausea.   All other systems reviewed and negative.   PHYSICAL EXAM: VS:  BP 117/71  Pulse 59  Ht 5' 3.5" (1.613 m)  Wt 121 lb (54.885 kg)  BMI 21.10 kg/m2 Well nourished, well developed, in no acute distress HEENT: normal Neck: no JVD Vascular: No carotid bruits Endocrine: No thyromegaly Cardiac:  normal S1,  S2; RRR; no murmur Lungs:  clear to auscultation bilaterally, no wheezing, rhonchi or rales Abd: soft, nontender, no hepatomegaly Ext: no edema Skin: warm and dry Neuro:  CNs 2-12 intact, no focal abnormalities noted  EKG:  Sinus bradycardia, HR 59, no acute changes, PAC      ASSESSMENT AND PLAN:  1. Palpitations:   She is likely describing symptomatic PACs. These seem to be more frequent. She already takes 25 mg of Toprol. Her blood pressure and heart rate would not tolerate increasing this medication. She does have some increased stress in her life (daughter getting married). She does not feel that this is contributing. She does limit her caffeine quite a bit. There has been no increase. She denies any over-the-counter medications that would exacerbate palpitations. Her palpitations occur daily. She also has some chest discomfort. I will have her  undergo a 48-hour Holter monitor. I will also obtain a 2-D echocardiogram. I will obtain a routine exercise treadmill test. She will followup in 4-6 weeks with either Dr. Daleen Squibb or me. Of note, she had serial blood tests performed with her gynecologist recently. This likely included a basic metabolic panel and TSH. We will obtain those results.  2. Chest Pain:   Atypical.  She does have a strong FHx of CAD. Proceed with ETT as noted.  Signed, Tereso Newcomer, PA-C  1:10 PM 10/09/2012

## 2012-10-09 NOTE — Patient Instructions (Addendum)
Your physician has recommended that you wear a holter monitor. Holter monitors are medical devices that record the heart's electrical activity. Doctors most often use these monitors to diagnose arrhythmias. Arrhythmias are problems with the speed or rhythm of the heartbeat. The monitor is a small, portable device. You can wear one while you do your normal daily activities. This is usually used to diagnose what is causing palpitations/syncope (passing out).  Your physician has requested that you have an exercise tolerance test. For further information please visit [email or web address] Please also follow instruction sheet, as given.  Your physician has requested that you have an echocardiogram. Echocardiography is a painless test that uses sound waves to create images of your heart. It provides your doctor with information about the size and shape of your heart and how well your heart's chambers and valves are working. This procedure takes approximately one hour. There are no restrictions for this procedure.  Your physician recommends that you schedule a follow-up appointment in: 4 to 6 weeks

## 2012-10-30 ENCOUNTER — Telehealth: Payer: Self-pay | Admitting: *Deleted

## 2012-10-30 ENCOUNTER — Ambulatory Visit (HOSPITAL_COMMUNITY): Payer: BC Managed Care – PPO | Attending: Cardiology | Admitting: Radiology

## 2012-10-30 ENCOUNTER — Encounter (INDEPENDENT_AMBULATORY_CARE_PROVIDER_SITE_OTHER): Payer: BC Managed Care – PPO

## 2012-10-30 DIAGNOSIS — R079 Chest pain, unspecified: Secondary | ICD-10-CM

## 2012-10-30 DIAGNOSIS — I059 Rheumatic mitral valve disease, unspecified: Secondary | ICD-10-CM | POA: Insufficient documentation

## 2012-10-30 DIAGNOSIS — I369 Nonrheumatic tricuspid valve disorder, unspecified: Secondary | ICD-10-CM | POA: Insufficient documentation

## 2012-10-30 DIAGNOSIS — R002 Palpitations: Secondary | ICD-10-CM

## 2012-10-30 NOTE — Progress Notes (Signed)
Echocardiogram performed.  

## 2012-10-30 NOTE — Telephone Encounter (Signed)
48 Hr Holter monitor placed on Pt. 10/30/12 TK 

## 2012-10-31 ENCOUNTER — Encounter: Payer: Self-pay | Admitting: Physician Assistant

## 2012-10-31 ENCOUNTER — Telehealth: Payer: Self-pay | Admitting: *Deleted

## 2012-10-31 NOTE — Telephone Encounter (Signed)
Message copied by Tarri Fuller on Tue Oct 31, 2012 11:14 AM ------      Message from: [place], [place] T      Created: Tue Oct 31, 2012  8:00 AM       Echo ok with      Normal LV function.      Tereso Newcomer, PA-C 10/31/2012 7:59 AM

## 2012-10-31 NOTE — Telephone Encounter (Signed)
lmom echo normal 

## 2012-11-14 ENCOUNTER — Telehealth: Payer: Self-pay | Admitting: Cardiology

## 2012-11-14 NOTE — Telephone Encounter (Signed)
Monitor has been to Dr Antoine Poche for review.  Will call the patient after he reviews

## 2012-11-14 NOTE — Telephone Encounter (Signed)
Spoke with pt, aware monitor reviewed by dr hochrein and dr Shirlee Latch shows NSR, occ sinus tach and sinus bradycardia, occ PAC and short atrial runs. Pt reports lightheadedness with the PAC's, discussed with scott weaver pac, pt instructed to take extra metoprolol as needed for increased palpitations.

## 2012-11-14 NOTE — Telephone Encounter (Signed)
Pt requesting heart monitor results

## 2012-11-20 ENCOUNTER — Ambulatory Visit (INDEPENDENT_AMBULATORY_CARE_PROVIDER_SITE_OTHER): Payer: BC Managed Care – PPO | Admitting: Physician Assistant

## 2012-11-20 DIAGNOSIS — R002 Palpitations: Secondary | ICD-10-CM

## 2012-11-20 DIAGNOSIS — R079 Chest pain, unspecified: Secondary | ICD-10-CM

## 2012-11-20 NOTE — Progress Notes (Signed)
Exercise Treadmill Test  Pre-Exercise Testing Evaluation Rhythm: normal sinus  Rate: 59                 Test  Exercise Tolerance Test Ordering MD: Valera Castle, MD  Interpreting MD: Tereso Newcomer, PA-C  Unique Test No: 1  Treadmill:  1  Indication for ETT: chest pain - rule out ischemia  Contraindication to ETT: No   Stress Modality: exercise - treadmill  Cardiac Imaging Performed: non   Protocol: standard Bruce - maximal  Max BP:  158/94  Max MPHR (bpm):  161 85% MPR (bpm):  137  MPHR obtained (bpm):  155 % MPHR obtained:  88  Reached 85% MPHR (min:sec):  9:00 Total Exercise Time (min-sec):  10:01  Workload in METS:  11.5 Borg Scale: 19  Reason ETT Terminated:  desired heart rate attained    ST Segment Analysis At Rest: non-specific ST segment slurring With Exercise: non-specific ST changes  Other Information Arrhythmia:  No Angina during ETT:  present (1) Quality of ETT:  diagnostic  ETT Interpretation:  normal - no evidence of ischemia by ST analysis  Comments: Good exercise tolerance. Patient c/o chest pain before exercise.  This was slightly worse with exercise. Normal BP response to exercise. Non-specific ST-T changes.  Nothing to suggest ischemia.   Recommendations: Follow up as directed. Luna Glasgow, PA-C  11:07 AM 11/20/2012

## 2012-12-11 ENCOUNTER — Encounter: Payer: Self-pay | Admitting: Gastroenterology

## 2012-12-27 ENCOUNTER — Ambulatory Visit (INDEPENDENT_AMBULATORY_CARE_PROVIDER_SITE_OTHER): Payer: BC Managed Care – PPO | Admitting: Cardiology

## 2012-12-27 ENCOUNTER — Encounter: Payer: Self-pay | Admitting: Cardiology

## 2012-12-27 VITALS — BP 110/62 | HR 64 | Ht 63.5 in | Wt 120.0 lb

## 2012-12-27 DIAGNOSIS — R002 Palpitations: Secondary | ICD-10-CM

## 2012-12-27 NOTE — Assessment & Plan Note (Signed)
Persistent though no direct correlation. Continue beta blocker and reassurance given. Return the office in one year.

## 2012-12-27 NOTE — Progress Notes (Signed)
HPI Brenda Hudson turns today for evaluation and management of her palpitations and chest pain. She had extensive evaluation with a Holter monitor, 2-D echocardiogram, as well as a exercise treadmill. All of these were unremarkable and her echo did not show mitral valve prolapse. She has some PACs and some brief atrial runs on her Holter but they did not correlate with symptoms.  She continues to have palpitations and chest pain. It is not worse with exercise. She is still on a beta blocker.  Past Medical History  Diagnosis Date  . MVP (mitral valve prolapse)   . Palpitations   . Polymyalgia rheumatica   . Allergy   . Anxiety   . Hx of echocardiogram     a. Echo 12/13:  EF 55-60%, mild MR, normal diast fxn, no MVP    Current Outpatient Prescriptions  Medication Sig Dispense Refill  . metoprolol succinate (TOPROL-XL) 50 MG 24 hr tablet TAKE 1/2 TABLET BY MOUTH EVERY DAY  15 tablet  10  . naproxen (NAPROSYN) 250 MG tablet Take 250 mg by mouth 2 (two) times daily with a meal.       No current facility-administered medications for this visit.    Allergies  Allergen Reactions  . Cefaclor     REACTION: rash  . Penicillins     REACTION: rash  . Sulfonamide Derivatives     REACTION: swelling    Family History  Problem Relation Age of Onset  . Sleep apnea Mother     History   Social History  . Marital Status: Married    Spouse Name: N/A    Number of Children: N/A  . Years of Education: N/A   Occupational History  . Not on file.   Social History Main Topics  . Smoking status: Never Smoker   . Smokeless tobacco: Not on file  . Alcohol Use: No  . Drug Use:   . Sexually Active:    Other Topics Concern  . Not on file   Social History Narrative  . No narrative on file    ROS ALL NEGATIVE EXCEPT THOSE NOTED IN HPI  PE  General Appearance: well developed, well nourished in no acute distress HEENT: symmetrical face, PERRLA, good dentition  Neck: no JVD, thyromegaly,  or adenopathy, trachea midline Chest: symmetric without deformity Cardiac: PMI non-displaced, RRR, normal S1, S2, no gallop or murmur, no click Lung: clear to ausculation and percussion Vascular: all pulses full without bruits  Abdominal: nondistended, nontender, good bowel sounds, no HSM, no bruits Extremities: no cyanosis, clubbing or edema, no sign of DVT, no varicosities  Skin: normal color, no rashes Neuro: alert and oriented x 3, non-focal Pysch: normal affect  EKG  BMET    Component Value Date/Time   NA 144 01/20/2011 0815   K 4.7 01/20/2011 0815   CL 103 01/20/2011 0815   CO2 34* 01/20/2011 0815   GLUCOSE 85 01/20/2011 0815   BUN 15 01/20/2011 0815   CREATININE 0.7 01/20/2011 0815   CALCIUM 9.8 01/20/2011 0815   GFRNONAA 69 03/13/2008 0857   GFRAA 84 03/13/2008 0857    Lipid Panel     Component Value Date/Time   CHOL 232* 01/20/2011 0815   TRIG 145.0 01/20/2011 0815   HDL 61.50 01/20/2011 0815   CHOLHDL 4 01/20/2011 0815   VLDL 29.0 01/20/2011 0815   LDLCALC 119* 03/13/2008 0857    CBC    Component Value Date/Time   WBC 6.6 01/20/2011 0815   RBC 4.19 01/20/2011  0815   HGB 13.2 01/20/2011 0815   HCT 39.1 01/20/2011 0815   PLT 271.0 01/20/2011 0815   MCV 93.2 01/20/2011 0815   MCHC 33.8 01/20/2011 0815   RDW 14.4 01/20/2011 0815   LYMPHSABS 1.4 01/20/2011 0815   MONOABS 0.6 01/20/2011 0815   EOSABS 0.2 01/20/2011 0815   BASOSABS 0.0 01/20/2011 0815

## 2013-01-11 ENCOUNTER — Encounter: Payer: Self-pay | Admitting: Family Medicine

## 2013-01-11 ENCOUNTER — Ambulatory Visit (INDEPENDENT_AMBULATORY_CARE_PROVIDER_SITE_OTHER): Payer: BC Managed Care – PPO | Admitting: Family Medicine

## 2013-01-11 VITALS — BP 92/68 | HR 70 | Temp 98.2°F | Wt 122.0 lb

## 2013-01-11 DIAGNOSIS — J329 Chronic sinusitis, unspecified: Secondary | ICD-10-CM

## 2013-01-11 MED ORDER — AZITHROMYCIN 250 MG PO TABS: ORAL_TABLET | ORAL | Status: DC

## 2013-01-11 NOTE — Progress Notes (Signed)
Chief Complaint  Patient presents with  . Sore Throat    facial presure and pain, eyes ache, chest hurts, teeth hurt     HPI:  Acute visit for ? Sinus infection: -started: 4 days ago -symptoms:nasal congestion, sore throat, cough, tooth pain, body aches, fascial pressure and pain,  -denies:fever, SOB, NVD -has tried: musinex and advil -sick contacts: no sick contact -Hx of: sinus infections and she always gets zpack for this - last sinus infection was over 1 year ago   ROS: See pertinent positives and negatives per HPI.  Past Medical History  Diagnosis Date  . MVP (mitral valve prolapse)   . Palpitations   . Polymyalgia rheumatica   . Allergy   . Anxiety   . Hx of echocardiogram     a. Echo 12/13:  EF 55-60%, mild MR, normal diast fxn, no MVP    Family History  Problem Relation Age of Onset  . Sleep apnea Mother     History   Social History  . Marital Status: Married    Spouse Name: N/A    Number of Children: N/A  . Years of Education: N/A   Social History Main Topics  . Smoking status: Never Smoker   . Smokeless tobacco: None  . Alcohol Use: No  . Drug Use:   . Sexually Active:    Other Topics Concern  . None   Social History Narrative  . None    Current outpatient prescriptions:metoprolol succinate (TOPROL-XL) 50 MG 24 hr tablet, TAKE 1/2 TABLET BY MOUTH EVERY DAY, Disp: 15 tablet, Rfl: 10;  naproxen (NAPROSYN) 250 MG tablet, Take 250 mg by mouth 2 (two) times daily with a meal., Disp: , Rfl: ;  azithromycin (ZITHROMAX) 250 MG tablet, 2 tabs on the first, then 1 tab daily for 4 days, Disp: 6 tablet, Rfl: 0  EXAM:  Filed Vitals:   01/11/13 1350  BP: 92/68  Pulse: 70  Temp: 98.2 F (36.8 C)    Body mass index is 21.27 kg/(m^2).  GENERAL: vitals reviewed and listed above, alert, oriented, appears well hydrated and in no acute distress  HEENT: atraumatic, conjunttiva clear, no obvious abnormalities on inspection of external nose and ears  NECK:  no obvious masses on inspection  LUNGS: clear to auscultation bilaterally, no wheezes, rales or rhonchi, good air movement  CV: HRRR, no peripheral edema  MS: moves all extremities without noticeable abnormality  PSYCH: pleasant and cooperative, no obvious depression or anxiety  ASSESSMENT AND PLAN:  Discussed the following assessment and plan:  Sinusitis - Plan: azithromycin (ZITHROMAX) 250 MG tablet  -advised this is likely viral - but with the facial pain and hx - gave rx to have on hand if worsens or not getting better - risks discussed -Patient advised to return or notify a doctor immediately if symptoms worsen or persist or new concerns arise.  Patient Instructions  INSTRUCTIONS FOR UPPER RESPIRATORY INFECTION:  -As we discussed, we have prescribed a new medication (Azithromycin) for you at this appointment. We discussed the common and serious potential adverse effects of this medication and you can review these and more with the pharmacist when you pick up your medication.  Please follow the instructions for use carefully and notify us immediately if you have any problems taking this medication.   -plenty of rest and fluids  -nasal saline wash 2-3 times daily (use prepackaged nasal saline or bottled/distilled water if making your own)   -can use sinex nasal spray for drainage  and nasal congestion - but do NOT use longer then 3-4 days  -can use tylenol or ibuprofen as directed for aches and sorethroat  -in the winter time, using a humidifier at night is helpful (please follow cleaning instructions)  -if you are taking a cough medication - use only as directed, may also try a teaspoon of honey to coat the throat and throat lozenges  -for sore throat, salt water gargles can help  -follow up if you have fevers, facial pain, tooth pain, difficulty breathing or are worsening or not getting better in 5-7 days      KIM, Dahlia Client R.

## 2013-01-11 NOTE — Patient Instructions (Addendum)
INSTRUCTIONS FOR UPPER RESPIRATORY INFECTION:  -As we discussed, we have prescribed a new medication (Azithromycin) for you at this appointment. We discussed the common and serious potential adverse effects of this medication and you can review these and more with the pharmacist when you pick up your medication.  Please follow the instructions for use carefully and notify us immediately if you have any problems taking this medication.   -plenty of rest and fluids  -nasal saline wash 2-3 times daily (use prepackaged nasal saline or bottled/distilled water if making your own)   -can use sinex nasal spray for drainage and nasal congestion - but do NOT use longer then 3-4 days  -can use tylenol or ibuprofen as directed for aches and sorethroat  -in the winter time, using a humidifier at night is helpful (please follow cleaning instructions)  -if you are taking a cough medication - use only as directed, may also try a teaspoon of honey to coat the throat and throat lozenges  -for sore throat, salt water gargles can help  -follow up if you have fevers, facial pain, tooth pain, difficulty breathing or are worsening or not getting better in 5-7 days

## 2013-02-01 ENCOUNTER — Other Ambulatory Visit: Payer: Self-pay | Admitting: Cardiology

## 2013-02-15 ENCOUNTER — Encounter: Payer: Self-pay | Admitting: Gastroenterology

## 2013-03-22 ENCOUNTER — Telehealth: Payer: Self-pay | Admitting: Internal Medicine

## 2013-03-22 NOTE — Telephone Encounter (Signed)
Pt would like to come in and discuss her husband past health. Pt hus Chrissie Noa died causes was sudden cardiac arrest. Pt wife will bring document etc for MD to review and compare with last yrs physicals. Can I sch? Pt wife stated they said there was no need for autopsy

## 2013-03-23 NOTE — Telephone Encounter (Signed)
Talked with ms. Kosta

## 2013-03-30 ENCOUNTER — Encounter: Payer: BC Managed Care – PPO | Admitting: Gastroenterology

## 2013-04-10 ENCOUNTER — Encounter: Payer: Self-pay | Admitting: Gastroenterology

## 2013-05-17 ENCOUNTER — Encounter: Payer: Self-pay | Admitting: Gastroenterology

## 2013-05-17 ENCOUNTER — Ambulatory Visit (AMBULATORY_SURGERY_CENTER): Payer: BC Managed Care – PPO | Admitting: *Deleted

## 2013-05-17 VITALS — Ht 63.5 in | Wt 111.6 lb

## 2013-05-17 DIAGNOSIS — Z1211 Encounter for screening for malignant neoplasm of colon: Secondary | ICD-10-CM

## 2013-05-17 MED ORDER — NA SULFATE-K SULFATE-MG SULF 17.5-3.13-1.6 GM/177ML PO SOLN: 1.0000 | Freq: Once | ORAL | Status: DC

## 2013-05-31 ENCOUNTER — Encounter: Payer: BC Managed Care – PPO | Admitting: Gastroenterology

## 2013-07-12 ENCOUNTER — Encounter: Payer: Self-pay | Admitting: Internal Medicine

## 2013-07-30 ENCOUNTER — Telehealth: Payer: Self-pay | Admitting: Gastroenterology

## 2013-07-30 NOTE — Telephone Encounter (Signed)
Pt states she would be a little late starting the prep due to her dinner meeting, she know she is to have clear liquids. Discussed with pt that she should be fine starting the prep a little later but to be sure and drink lots of liquid following the prep as per her instructions.

## 2013-08-02 ENCOUNTER — Telehealth: Payer: Self-pay | Admitting: Gastroenterology

## 2013-08-02 NOTE — Telephone Encounter (Signed)
no

## 2013-08-03 ENCOUNTER — Encounter: Payer: BC Managed Care – PPO | Admitting: Gastroenterology

## 2013-08-15 LAB — HM MAMMOGRAPHY: HM Mammogram: NORMAL

## 2013-08-15 LAB — HM PAP SMEAR: HM Pap smear: ABNORMAL

## 2013-08-28 ENCOUNTER — Other Ambulatory Visit (INDEPENDENT_AMBULATORY_CARE_PROVIDER_SITE_OTHER): Payer: BC Managed Care – PPO

## 2013-08-28 DIAGNOSIS — Z0000 Encounter for general adult medical examination without abnormal findings: Secondary | ICD-10-CM

## 2013-08-28 DIAGNOSIS — Z23 Encounter for immunization: Secondary | ICD-10-CM

## 2013-08-28 LAB — POCT URINALYSIS DIPSTICK
Ketones, UA: NEGATIVE
Leukocytes, UA: NEGATIVE
Nitrite, UA: NEGATIVE
Protein, UA: NEGATIVE
Urobilinogen, UA: 0.2

## 2013-08-28 LAB — BASIC METABOLIC PANEL
BUN: 14 mg/dL (ref 6–23)
CO2: 32 mEq/L (ref 19–32)
Chloride: 103 mEq/L (ref 96–112)
Creatinine, Ser: 0.9 mg/dL (ref 0.4–1.2)

## 2013-08-28 LAB — LIPID PANEL
Cholesterol: 189 mg/dL (ref 0–200)
VLDL: 7.6 mg/dL (ref 0.0–40.0)

## 2013-08-28 LAB — CBC WITH DIFFERENTIAL/PLATELET
Basophils Relative: 0.4 % (ref 0.0–3.0)
Eosinophils Absolute: 0.1 10*3/uL (ref 0.0–0.7)
Lymphs Abs: 1.2 10*3/uL (ref 0.7–4.0)
MCHC: 34 g/dL (ref 30.0–36.0)
MCV: 89.2 fl (ref 78.0–100.0)
Monocytes Absolute: 0.4 10*3/uL (ref 0.1–1.0)
Neutrophils Relative %: 67.3 % (ref 43.0–77.0)
Platelets: 244 10*3/uL (ref 150.0–400.0)
RBC: 4.4 Mil/uL (ref 3.87–5.11)

## 2013-08-28 LAB — HEPATIC FUNCTION PANEL
Bilirubin, Direct: 0 mg/dL (ref 0.0–0.3)
Total Protein: 6.7 g/dL (ref 6.0–8.3)

## 2013-08-28 LAB — TSH: TSH: 1.87 u[IU]/mL (ref 0.35–5.50)

## 2013-09-04 ENCOUNTER — Encounter: Payer: BC Managed Care – PPO | Admitting: Internal Medicine

## 2013-09-07 ENCOUNTER — Encounter: Payer: Self-pay | Admitting: Internal Medicine

## 2013-09-07 ENCOUNTER — Ambulatory Visit (INDEPENDENT_AMBULATORY_CARE_PROVIDER_SITE_OTHER): Payer: BC Managed Care – PPO | Admitting: Internal Medicine

## 2013-09-07 VITALS — BP 108/70 | HR 68 | Temp 98.2°F | Wt 114.0 lb

## 2013-09-07 DIAGNOSIS — Z23 Encounter for immunization: Secondary | ICD-10-CM

## 2013-09-07 DIAGNOSIS — Z0000 Encounter for general adult medical examination without abnormal findings: Secondary | ICD-10-CM

## 2013-09-07 DIAGNOSIS — M79609 Pain in unspecified limb: Secondary | ICD-10-CM

## 2013-09-07 DIAGNOSIS — Z2911 Encounter for prophylactic immunotherapy for respiratory syncytial virus (RSV): Secondary | ICD-10-CM

## 2013-09-07 LAB — RHEUMATOID FACTOR: Rhuematoid fact SerPl-aCnc: <10 IU/mL (ref <=14)

## 2013-09-07 NOTE — Addendum Note (Signed)
Addended by: Azucena Freed on: 09/07/2013 09:03 AM   Modules accepted: Orders

## 2013-09-07 NOTE — Progress Notes (Signed)
CPX  Past Medical History  Diagnosis Date  . MVP (mitral valve prolapse)   . Palpitations   . Polymyalgia rheumatica   . Allergy   . Anxiety   . Hx of echocardiogram     a. Echo 12/13:  EF 55-60%, mild MR, normal diast fxn, no MVP  . Spinal stenosis     History   Social History  . Marital Status: Married    Spouse Name: N/A    Number of Children: N/A  . Years of Education: N/A   Occupational History  . Not on file.   Social History Main Topics  . Smoking status: Never Smoker   . Smokeless tobacco: Never Used  . Alcohol Use: No  . Drug Use: No  . Sexual Activity: Not on file   Other Topics Concern  . Not on file   Social History Narrative  . No narrative on file    Past Surgical History  Procedure Laterality Date  . Dilation and curettage of uterus      Family History  Problem Relation Age of Onset  . Sleep apnea Mother   . Colon cancer Neg Hx   . Esophageal cancer Neg Hx   . Rectal cancer Neg Hx   . Stomach cancer Neg Hx     Allergies  Allergen Reactions  . Cefaclor     REACTION: rash  . Penicillins     REACTION: rash  . Sulfonamide Derivatives     REACTION: swelling    Current Outpatient Prescriptions on File Prior to Visit  Medication Sig Dispense Refill  . metoprolol succinate (TOPROL-XL) 50 MG 24 hr tablet TAKE 1/2 TABLET BY MOUTH EVERY DAY  15 tablet  10  . Na Sulfate-K Sulfate-Mg Sulf SOLN Take 1 kit by mouth once. suprep as directed. No substitutions.  354 mL  0  . naproxen (NAPROSYN) 250 MG tablet Take 250 mg by mouth 2 (two) times daily with a meal.       No current facility-administered medications on file prior to visit.     patient denies chest pain, shortness of breath, orthopnea. Denies lower extremity edema, abdominal pain, change in appetite, change in bowel movements. Patient denies rashes, musculoskeletal complaints. No other specific complaints in a complete review of systems.   Vitals reviewed  Well-developed  well-nourished female in no acute distress. HEENT exam atraumatic, normocephalic, extraocular muscles are intact. Neck is supple. No jugular venous distention no thyromegaly. Chest clear to auscultation without increased work of breathing. Cardiac exam S1 and S2 are regular. Abdominal exam active bowel sounds, soft, nontender. Extremities no edema. Neurologic exam she is alert without any motor sensory deficits. Gait is normal.  Well Visit- health maint UTD  Stiff hands- hx of PMR Check RA labs-

## 2013-09-10 LAB — CYCLIC CITRUL PEPTIDE ANTIBODY, IGG: Cyclic Citrullin Peptide Ab: <2 U/mL (ref 0.0–5.0)

## 2013-12-19 ENCOUNTER — Ambulatory Visit (INDEPENDENT_AMBULATORY_CARE_PROVIDER_SITE_OTHER): Payer: BC Managed Care – PPO | Admitting: Psychology

## 2013-12-19 DIAGNOSIS — F4321 Adjustment disorder with depressed mood: Secondary | ICD-10-CM

## 2013-12-25 ENCOUNTER — Encounter: Payer: BC Managed Care – PPO | Admitting: Cardiology

## 2013-12-28 ENCOUNTER — Ambulatory Visit (INDEPENDENT_AMBULATORY_CARE_PROVIDER_SITE_OTHER): Payer: BC Managed Care – PPO | Admitting: Psychology

## 2013-12-28 ENCOUNTER — Ambulatory Visit: Payer: BC Managed Care – PPO | Admitting: Psychology

## 2013-12-28 DIAGNOSIS — F4321 Adjustment disorder with depressed mood: Secondary | ICD-10-CM

## 2014-01-03 ENCOUNTER — Ambulatory Visit (INDEPENDENT_AMBULATORY_CARE_PROVIDER_SITE_OTHER): Payer: BC Managed Care – PPO | Admitting: Cardiology

## 2014-01-03 ENCOUNTER — Encounter: Payer: Self-pay | Admitting: Cardiology

## 2014-01-03 VITALS — BP 110/68 | HR 59 | Ht 63.0 in | Wt 114.0 lb

## 2014-01-03 DIAGNOSIS — R002 Palpitations: Secondary | ICD-10-CM

## 2014-01-03 DIAGNOSIS — R42 Dizziness and giddiness: Secondary | ICD-10-CM

## 2014-01-03 DIAGNOSIS — R079 Chest pain, unspecified: Secondary | ICD-10-CM

## 2014-01-03 LAB — COMPREHENSIVE METABOLIC PANEL
ALT: 15 U/L (ref 0–35)
AST: 24 U/L (ref 0–37)
Albumin: 4 g/dL (ref 3.5–5.2)
Alkaline Phosphatase: 23 U/L — ABNORMAL LOW (ref 39–117)
BUN: 16 mg/dL (ref 6–23)
CO2: 30 mEq/L (ref 19–32)
Calcium: 9.8 mg/dL (ref 8.4–10.5)
Chloride: 102 mEq/L (ref 96–112)
Creatinine, Ser: 0.7 mg/dL (ref 0.4–1.2)
GFR: 95.32 mL/min (ref >60.00)
Glucose, Bld: 76 mg/dL (ref 70–99)
Potassium: 4.7 mEq/L (ref 3.5–5.1)
Sodium: 138 mEq/L (ref 135–145)
Total Bilirubin: 0.6 mg/dL (ref 0.3–1.2)
Total Protein: 6.5 g/dL (ref 6.0–8.3)

## 2014-01-03 LAB — TSH: TSH: 1.16 u[IU]/mL (ref 0.35–5.50)

## 2014-01-03 NOTE — Patient Instructions (Addendum)
**Note De-Identified  Obfuscation** Your physician has recommended that you wear a 48 hour holter monitor. Holter monitors are medical devices that record the heart's electrical activity. Doctors most often use these monitors to diagnose arrhythmias. Arrhythmias are problems with the speed or rhythm of the heartbeat. The monitor is a small, portable device. You can wear one while you do your normal daily activities. This is usually used to diagnose what is causing palpitations/syncope (passing out).  Your physician has requested that you have cardiac CT. Cardiac computed tomography (CT) is a painless test that uses an x-ray machine to take clear, detailed pictures of your heart. For further information please visit [email or web address]. Please follow instruction sheet as given.  Your physician recommends that you return for lab work in: today  Your physician wants you to follow-up in: 1 year. You will receive a reminder letter in the mail two months in advance. If you don't receive a letter, please call our office to schedule the follow-up appointment.

## 2014-01-03 NOTE — Progress Notes (Signed)
Patient ID: LORALEI RADCLIFFE, female   DOB: [birth year], 63 y.o.   MRN: [id number]     Patient Name: Brenda Hudson Date of Encounter: 01/03/2014  Primary Care Provider:  Chancy Hurter, MD Primary Cardiologist:  Ena Dawley, H (previously Dr Verl Blalock)  Problem List   Past Medical History  Diagnosis Date  . MVP (mitral valve prolapse)   . Palpitations   . Polymyalgia rheumatica   . Allergy   . Anxiety   . Hx of echocardiogram     a. Echo 12/13:  EF 55-60%, mild MR, normal diast fxn, no MVP  . Spinal stenosis    Past Surgical History  Procedure Laterality Date  . Dilation and curettage of uterus     Allergies  Allergies  Allergen Reactions  . Cefaclor     REACTION: rash  . Penicillins     REACTION: rash  . Sulfonamide Derivatives     REACTION: swelling   HPI  Brenda Hudson turns today for evaluation and management of her palpitations and chest pain. She had extensive evaluation with a Holter monitor, 2-D echocardiogram, as well as a exercise treadmill. All of these were unremarkable and her echo did not show mitral valve prolapse. She has some PACs and some brief atrial runs on her Holter but they did not correlate with symptoms.  She is coming after 1 year. She ha had a very difficult year as her husband passed away in Mar 15, 2013. He was an avid runner in perfect health. He died of SCD while running and couldn't be resuscitated.  She continues to have palpitations, that are increasing in frequency and are currently happening on a daily basis. On few occasions she felt busy with it. She still experiences chest pain that is retrosternal can be associated with or without exertion has no radiation. There is no obvious alleviating or aggravating factors.    Home Medications  Prior to Admission medications   Medication Sig Start Date End Date Taking? Authorizing Provider  Ascorbic Acid (VITAMIN C) 100 MG tablet Take 100 mg by mouth daily.   Yes Historical Provider,  MD  metoprolol succinate (TOPROL-XL) 50 MG 24 hr tablet TAKE 1/2 TABLET BY MOUTH EVERY DAY 02/01/13  Yes Renella Cunas, MD  naproxen (NAPROSYN) 250 MG tablet Take 250 mg by mouth 2 (two) times daily with a meal.   Yes Historical Provider, MD  Na Sulfate-K Sulfate-Mg Sulf SOLN Take 1 kit by mouth once. suprep as directed. No substitutions. 05/17/13   Inda Castle, MD    Family History  Family History  Problem Relation Age of Onset  . Sleep apnea Mother   . Colon cancer Neg Hx   . Esophageal cancer Neg Hx   . Rectal cancer Neg Hx   . Stomach cancer Neg Hx     Social History  History   Social History  . Marital Status: Married    Spouse Name: N/A    Number of Children: N/A  . Years of Education: N/A   Occupational History  . Not on file.   Social History Main Topics  . Smoking status: Never Smoker   . Smokeless tobacco: Never Used  . Alcohol Use: No  . Drug Use: No  . Sexual Activity: Not on file   Other Topics Concern  . Not on file   Social History Narrative  . No narrative on file     Review of Systems, as per HPI, otherwise negative General:  No chills,  fever, night sweats or weight changes.  Cardiovascular:  No chest pain, dyspnea on exertion, edema, orthopnea, palpitations, paroxysmal nocturnal dyspnea. Dermatological: No rash, lesions/masses Respiratory: No cough, dyspnea Urologic: No hematuria, dysuria Abdominal:   No nausea, vomiting, diarrhea, bright red blood per rectum, melena, or hematemesis Neurologic:  No visual changes, wkns, changes in mental status. All other systems reviewed and are otherwise negative except as noted above.  Physical Exam  Blood pressure 110/68, pulse 59, height _0  (1.6 m), weight 114 lb (51.71 kg).  General: Pleasant, NAD Psych: Normal affect. Neuro: Alert and oriented X 3. Moves all extremities spontaneously. HEENT: Normal  Neck: Supple without bruits or JVD. Lungs:  Resp regular and unlabored, CTA. Heart: RRR no s3,  s4, or murmurs. Abdomen: Soft, non-tender, non-distended, BS + x 4.  Extremities: No clubbing, cyanosis or edema. DP/PT/Radials 2+ and equal bilaterally.  Labs:  No results found for this basename: CKTOTAL, CKMB, TROPONINI,  in the last 72 hours Lab Results  Component Value Date   WBC 5.3 08/28/2013   HGB 13.3 08/28/2013   HCT 39.2 08/28/2013   MCV 89.2 08/28/2013   PLT 244.0 08/28/2013   No results found for this basename: NA, K, CL, CO2, BUN, CREATININE, CALCIUM, LABALBU, PROT, BILITOT, ALKPHOS, ALT, AST, GLUCOSE,  in the last 168 hours Lab Results  Component Value Date   CHOL 189 08/28/2013   HDL 78.30 08/28/2013   LDLCALC 103* 08/28/2013   TRIG 38.0 08/28/2013   Lab Results  Component Value Date   DDIMER 0.26 12/20/2011   No components found with this basename: POCBNP,   Accessory Clinical Findings  Echocardiogram - 10/30/2012  - Left ventricle: The cavity size was normal. Wall thickness was normal. Systolic function was normal. The estimated ejection fraction was in the range of 55% to 60%. Wall motion was normal; there were no regional wall motion abnormalities. Left ventricular diastolic function parameters were normal. - Mitral valve: Mild regurgitation.  ECG - Sinus rhythm with sinus arrhythmia, left axis deviation low-voltage QRS and abnormal EKG.   Assessment & Plan  Pleasant 63 year old female  1. Palpitations - a daily basis and associated with dizziness. We will order a 48 hour Holter to evaluate for patient's arrhythmias. We will check comprehensive metabolic profile and TSH.  2. Chest pain - with some typical and some atypical features. Patient had a prior negative treadmill test, however she continues having symptoms. Her risk factors include slightly elevated LDL cholesterol and family history of coronary artery disease. Patient is currently per 5 as her husband was in perfect health died of sudden cardiac death. We will proceed with coronary CT that  will show [place] if there is any hemodynamically significant coronary artery disease and what is the plaque burden for prognosis assessment..  Followup after the tests are performed.  Brenda Spark, MD, [facility] 01/03/2014, 9:27 AM

## 2014-01-03 NOTE — Addendum Note (Signed)
Addended by: Dennie Fetters on: 01/03/2014 10:31 AM   Modules accepted: Orders

## 2014-01-04 ENCOUNTER — Ambulatory Visit (INDEPENDENT_AMBULATORY_CARE_PROVIDER_SITE_OTHER): Payer: BC Managed Care – PPO | Admitting: Psychology

## 2014-01-04 DIAGNOSIS — F4321 Adjustment disorder with depressed mood: Secondary | ICD-10-CM

## 2014-01-14 ENCOUNTER — Ambulatory Visit (INDEPENDENT_AMBULATORY_CARE_PROVIDER_SITE_OTHER): Payer: BC Managed Care – PPO | Admitting: Psychology

## 2014-01-14 ENCOUNTER — Telehealth: Payer: Self-pay | Admitting: Cardiology

## 2014-01-14 DIAGNOSIS — F4321 Adjustment disorder with depressed mood: Secondary | ICD-10-CM

## 2014-01-14 NOTE — Telephone Encounter (Signed)
ERROR//SR  °

## 2014-01-21 ENCOUNTER — Other Ambulatory Visit: Payer: Self-pay | Admitting: Cardiology

## 2014-01-22 ENCOUNTER — Encounter: Payer: Self-pay | Admitting: Cardiology

## 2014-01-23 ENCOUNTER — Encounter (INDEPENDENT_AMBULATORY_CARE_PROVIDER_SITE_OTHER): Payer: BC Managed Care – PPO

## 2014-01-23 ENCOUNTER — Encounter: Payer: Self-pay | Admitting: *Deleted

## 2014-01-23 DIAGNOSIS — R002 Palpitations: Secondary | ICD-10-CM

## 2014-01-23 DIAGNOSIS — R42 Dizziness and giddiness: Secondary | ICD-10-CM

## 2014-01-23 NOTE — Progress Notes (Signed)
Patient ID: Brenda Hudson, female   DOB: 31-Jan-1951, 64 y.o.   MRN: [id number] E-Cardio 48 hour holter monitor applied to patient.

## 2014-01-25 ENCOUNTER — Ambulatory Visit (INDEPENDENT_AMBULATORY_CARE_PROVIDER_SITE_OTHER): Payer: BC Managed Care – PPO | Admitting: Psychology

## 2014-01-25 DIAGNOSIS — F4321 Adjustment disorder with depressed mood: Secondary | ICD-10-CM

## 2014-01-31 ENCOUNTER — Ambulatory Visit (INDEPENDENT_AMBULATORY_CARE_PROVIDER_SITE_OTHER): Payer: BC Managed Care – PPO | Admitting: Psychology

## 2014-01-31 DIAGNOSIS — F4321 Adjustment disorder with depressed mood: Secondary | ICD-10-CM

## 2014-02-12 ENCOUNTER — Ambulatory Visit (HOSPITAL_COMMUNITY)
Admission: RE | Admit: 2014-02-12 | Discharge: 2014-02-12 | Disposition: A | Discharge status: Home / Self Care | Payer: BC Managed Care – PPO | Source: Ambulatory Visit | Attending: Cardiology | Admitting: Cardiology

## 2014-02-12 DIAGNOSIS — R079 Chest pain, unspecified: Secondary | ICD-10-CM

## 2014-02-12 MED ORDER — IOHEXOL 350 MG/ML SOLN
80.0000 mL | Freq: Once | INTRAVENOUS | Status: AC | PRN
  Administered 2014-02-12: 80 mL via INTRAVENOUS

## 2014-02-12 MED ORDER — METOPROLOL TARTRATE 1 MG/ML IV SOLN
2.5000 mg | Freq: Once | INTRAVENOUS | Status: AC
  Administered 2014-02-12: 2.5 mg via INTRAVENOUS
  Filled 2014-02-12: qty 5

## 2014-02-12 MED ORDER — NITROGLYCERIN 0.4 MG SL SUBL
SUBLINGUAL_TABLET | SUBLINGUAL | Status: AC
  Filled 2014-02-12: qty 1

## 2014-02-12 MED ORDER — METOPROLOL TARTRATE 1 MG/ML IV SOLN
INTRAVENOUS | Status: DC
Start: 2014-02-12 — End: 2014-02-13
  Filled 2014-02-12: qty 5

## 2014-02-12 MED ORDER — NITROGLYCERIN 0.4 MG SL SUBL
0.4000 mg | SUBLINGUAL_TABLET | SUBLINGUAL | Status: DC | PRN
  Administered 2014-02-12: 0.4 mg via SUBLINGUAL
  Filled 2014-02-12: qty 25

## 2014-02-20 ENCOUNTER — Ambulatory Visit (INDEPENDENT_AMBULATORY_CARE_PROVIDER_SITE_OTHER): Payer: BC Managed Care – PPO | Admitting: Psychology

## 2014-02-20 DIAGNOSIS — F4321 Adjustment disorder with depressed mood: Secondary | ICD-10-CM

## 2014-02-22 ENCOUNTER — Telehealth: Payer: Self-pay | Admitting: Cardiology

## 2014-02-22 MED ORDER — METOPROLOL SUCCINATE ER 50 MG PO TB24: 50.0000 mg | ORAL_TABLET | Freq: Every day | ORAL | Status: DC

## 2014-02-22 NOTE — Telephone Encounter (Signed)
Dr. Francesca Oman interpretation of 48 hour holter monitor- Very Frequent PACs. No arrythmia. Recommend per EP to increase Toprol to 50 mg QD. Pt notified by Dr. Meda Coffee and recall put in for 6 month follow-up.

## 2014-02-22 NOTE — Progress Notes (Signed)
Patient ID: Brenda Hudson, female   DOB: 10-Feb-1951, 63 y.o.   MRN: [id number]  24 hour Holter monitor showed 2300 PACs in 24 hours, no runs, no ventricular activity. EP didn't recommend to start antiarrhythmics as those are associated with increased risk of inducing VTs.  It was discussed with the patient. She is quite symptomatic and feels that at time this causes her fatigue. We will increase Toprol XL to 50 mg PO daily and follow up in 6 months. Would consider antiarrhythmics if no improvement in symptoms.  Dorothy Spark 02/22/2014

## 2014-03-07 ENCOUNTER — Ambulatory Visit (INDEPENDENT_AMBULATORY_CARE_PROVIDER_SITE_OTHER): Payer: BC Managed Care – PPO | Admitting: Psychology

## 2014-03-07 DIAGNOSIS — F4321 Adjustment disorder with depressed mood: Secondary | ICD-10-CM

## 2014-03-20 ENCOUNTER — Ambulatory Visit (INDEPENDENT_AMBULATORY_CARE_PROVIDER_SITE_OTHER): Payer: BC Managed Care – PPO | Admitting: Psychology

## 2014-03-20 DIAGNOSIS — F4321 Adjustment disorder with depressed mood: Secondary | ICD-10-CM

## 2014-04-10 ENCOUNTER — Ambulatory Visit (INDEPENDENT_AMBULATORY_CARE_PROVIDER_SITE_OTHER): Payer: BC Managed Care – PPO | Admitting: Psychology

## 2014-04-10 DIAGNOSIS — F4321 Adjustment disorder with depressed mood: Secondary | ICD-10-CM

## 2014-04-24 ENCOUNTER — Ambulatory Visit (INDEPENDENT_AMBULATORY_CARE_PROVIDER_SITE_OTHER): Payer: BC Managed Care – PPO | Admitting: Psychology

## 2014-04-24 DIAGNOSIS — F4321 Adjustment disorder with depressed mood: Secondary | ICD-10-CM

## 2014-05-06 ENCOUNTER — Ambulatory Visit (INDEPENDENT_AMBULATORY_CARE_PROVIDER_SITE_OTHER): Payer: BC Managed Care – PPO | Admitting: Psychology

## 2014-05-06 DIAGNOSIS — F4321 Adjustment disorder with depressed mood: Secondary | ICD-10-CM

## 2014-05-23 ENCOUNTER — Ambulatory Visit (INDEPENDENT_AMBULATORY_CARE_PROVIDER_SITE_OTHER): Payer: BC Managed Care – PPO | Admitting: Psychology

## 2014-05-23 DIAGNOSIS — F4321 Adjustment disorder with depressed mood: Secondary | ICD-10-CM

## 2014-05-28 ENCOUNTER — Encounter: Payer: Self-pay | Admitting: Physician Assistant

## 2014-05-28 ENCOUNTER — Ambulatory Visit (INDEPENDENT_AMBULATORY_CARE_PROVIDER_SITE_OTHER): Payer: BC Managed Care – PPO | Admitting: Physician Assistant

## 2014-05-28 VITALS — BP 110/78 | HR 66 | Temp 98.2°F | Resp 18 | Wt 115.0 lb

## 2014-05-28 DIAGNOSIS — H6120 Impacted cerumen, unspecified ear: Secondary | ICD-10-CM

## 2014-05-28 DIAGNOSIS — H6123 Impacted cerumen, bilateral: Secondary | ICD-10-CM

## 2014-05-28 NOTE — Progress Notes (Signed)
Pre visit review using our clinic review tool, if applicable. No additional management support is needed unless otherwise documented below in the visit note. 

## 2014-05-28 NOTE — Patient Instructions (Signed)
Over-the-counter wax softening drops are available for you to use whenever you feel as though the symptoms are beginning again.  If emergency symptoms discussed during visit developed, seek medical attention immediately.  Followup as needed, or for worsening or persistent symptoms despite treatment.    Cerumen Impaction A cerumen impaction is when the wax in your ear forms a plug. This plug usually causes reduced hearing. Sometimes it also causes an earache or dizziness. Removing a cerumen impaction can be difficult and painful. The wax sticks to the ear canal. The canal is sensitive and bleeds easily. If you try to remove a heavy wax buildup with a cotton tipped swab, you may push it in further. Irrigation with water, suction, and small ear curettes may be used to clear out the wax. If the impaction is fixed to the skin in the ear canal, ear drops may be needed for a few days to loosen the wax. People who build up a lot of wax frequently can use ear wax removal products available in your local drugstore. SEEK MEDICAL CARE IF:  You develop an earache, increased hearing loss, or marked dizziness. Document Released: 12/09/2004 Document Revised: 01/24/2012 Document Reviewed: 01/29/2010 [facility] Patient Information 2015 [place], [place]. This information is not intended to replace advice given to you by your health care provider. Make sure you discuss any questions you have with your health care provider.

## 2014-05-28 NOTE — Progress Notes (Signed)
Subjective:    Patient ID: Brenda Hudson, female    DOB: November 17, 1950, 63 y.o.   MRN: [id number]  HPI Patient is a 63 y.o. female presenting for cerumen impaction. Pt states that she has had cerumen impaction in the past requiring office lavage. Pt states that for the past month she has noticed a sensation of increased fullness in her ears, and some mild decreased hearing. She also admits to mild pain.  She denies fevers, chills, nausea, vomiting, diarrhea, SOB, chest pain, headache, dizziness, lightheadedness, syncope.    Review of Systems As per HPI and are otherwise negative.   Past Medical History  Diagnosis Date  . MVP (mitral valve prolapse)   . Palpitations   . Polymyalgia rheumatica   . Allergy   . Anxiety   . Hx of echocardiogram     a. Echo 12/13:  EF 55-60%, mild MR, normal diast fxn, no MVP  . Spinal stenosis     History   Social History  . Marital Status: Widowed    Spouse Name: N/A    Number of Children: N/A  . Years of Education: N/A   Occupational History  . Not on file.   Social History Main Topics  . Smoking status: Never Smoker   . Smokeless tobacco: Never Used  . Alcohol Use: No  . Drug Use: No  . Sexual Activity: Not on file   Other Topics Concern  . Not on file   Social History Narrative  . No narrative on file    Past Surgical History  Procedure Laterality Date  . Dilation and curettage of uterus      Family History  Problem Relation Age of Onset  . Sleep apnea Mother   . Colon cancer Neg Hx   . Esophageal cancer Neg Hx   . Rectal cancer Neg Hx   . Stomach cancer Neg Hx     Allergies  Allergen Reactions  . Cefaclor     REACTION: rash  . Penicillins     REACTION: rash  . Sulfonamide Derivatives     REACTION: swelling    Current Outpatient Prescriptions on File Prior to Visit  Medication Sig Dispense Refill  . Ascorbic Acid (VITAMIN C) 100 MG tablet Take 100 mg by mouth daily.      . metoprolol succinate  (TOPROL-XL) 50 MG 24 hr tablet Take 1 tablet (50 mg total) by mouth daily. Take with or immediately following a meal.  30 tablet  10  . Na Sulfate-K Sulfate-Mg Sulf SOLN Take 1 kit by mouth once. suprep as directed. No substitutions.  354 mL  0  . naproxen (NAPROSYN) 250 MG tablet Take 250 mg by mouth 2 (two) times daily with a meal.       No current facility-administered medications on file prior to visit.    EXAM: BP 110/78  Pulse 66  Temp(Src) 98.2 F (36.8 C) (Oral)  Resp 18  Wt 115 lb (52.164 kg)     Objective:   Physical Exam  Nursing note and vitals reviewed. Constitutional: She is oriented to person, place, and time. She appears well-developed and well-nourished. No distress.  HENT:  Head: Normocephalic and atraumatic.  Nose: Nose normal.  Mouth/Throat: Oropharynx is clear and moist. No oropharyngeal exudate.  Bilateral ear canals impacted with cerumen, clear after lavaged. Bilateral TMs normal. Bilateral frontal and maxillary sinuses non-TTP.   Eyes: Conjunctivae and EOM are normal. Pupils are equal, round, and reactive to light.  Neck: Normal  range of motion.  Cardiovascular: Normal rate, regular rhythm and intact distal pulses.   Pulmonary/Chest: Effort normal and breath sounds normal. No respiratory distress. She exhibits no tenderness.  Musculoskeletal: Normal range of motion.  Neurological: She is alert and oriented to person, place, and time.  Skin: Skin is warm and dry. No rash noted. She is not diaphoretic. No erythema. No pallor.  Psychiatric: She has a normal mood and affect. Her behavior is normal. Judgment and thought content normal.    Lab Results  Component Value Date   WBC 5.3 08/28/2013   HGB 13.3 08/28/2013   HCT 39.2 08/28/2013   PLT 244.0 08/28/2013   GLUCOSE 76 01/03/2014   CHOL 189 08/28/2013   TRIG 38.0 08/28/2013   HDL 78.30 08/28/2013   LDLDIRECT 149.9 01/20/2011   LDLCALC 103* 08/28/2013   ALT 15 01/03/2014   AST 24 01/03/2014   NA 138  01/03/2014   K 4.7 01/03/2014   CL 102 01/03/2014   CREATININE 0.7 01/03/2014   BUN 16 01/03/2014   CO2 30 01/03/2014   TSH 1.16 01/03/2014         Assessment & Plan:  Brenda Hudson was seen today for cerumen impaction.  Diagnoses and associated orders for this visit:  Cerumen impaction, bilateral Comments: Lavage in office. Encourage OTC earwax softener for when symptoms start to return.    Advised pt on OTC ear wax softening drops to avoid future recurrence.  Return precautions provided, and patient handout on cerumen impaction.  Plan to follow up as needed, or for worsening or persistent symptoms despite treatment.  Patient Instructions  Over-the-counter wax softening drops are available for you to use whenever you feel as though the symptoms are beginning again.  If emergency symptoms discussed during visit developed, seek medical attention immediately.  Followup as needed, or for worsening or persistent symptoms despite treatment.

## 2014-06-12 ENCOUNTER — Ambulatory Visit (INDEPENDENT_AMBULATORY_CARE_PROVIDER_SITE_OTHER): Payer: BC Managed Care – PPO | Admitting: Psychology

## 2014-06-12 DIAGNOSIS — F4321 Adjustment disorder with depressed mood: Secondary | ICD-10-CM

## 2014-06-17 ENCOUNTER — Encounter: Payer: Self-pay | Admitting: Gastroenterology

## 2014-07-05 ENCOUNTER — Ambulatory Visit (INDEPENDENT_AMBULATORY_CARE_PROVIDER_SITE_OTHER): Payer: BC Managed Care – PPO | Admitting: Psychology

## 2014-07-05 DIAGNOSIS — F4321 Adjustment disorder with depressed mood: Secondary | ICD-10-CM

## 2014-07-19 ENCOUNTER — Ambulatory Visit (INDEPENDENT_AMBULATORY_CARE_PROVIDER_SITE_OTHER): Payer: BC Managed Care – PPO | Admitting: Psychology

## 2014-07-19 DIAGNOSIS — F4321 Adjustment disorder with depressed mood: Secondary | ICD-10-CM

## 2014-07-30 ENCOUNTER — Ambulatory Visit (AMBULATORY_SURGERY_CENTER): Payer: BC Managed Care – PPO

## 2014-07-30 VITALS — Ht 63.0 in | Wt 115.8 lb

## 2014-07-30 DIAGNOSIS — Z8371 Family history of colonic polyps: Secondary | ICD-10-CM

## 2014-07-30 MED ORDER — NA SULFATE-K SULFATE-MG SULF 17.5-3.13-1.6 GM/177ML PO SOLN: ORAL | Status: DC

## 2014-07-30 NOTE — Progress Notes (Signed)
Per pt, no allergies to soy or egg products.Pt not taking any weight loss meds or using  O2 at home. 

## 2014-08-05 ENCOUNTER — Ambulatory Visit (INDEPENDENT_AMBULATORY_CARE_PROVIDER_SITE_OTHER): Payer: BC Managed Care – PPO | Admitting: Psychology

## 2014-08-05 DIAGNOSIS — F4321 Adjustment disorder with depressed mood: Secondary | ICD-10-CM

## 2014-08-08 ENCOUNTER — Encounter: Payer: Self-pay | Admitting: Gastroenterology

## 2014-08-08 ENCOUNTER — Ambulatory Visit (AMBULATORY_SURGERY_CENTER): Payer: BC Managed Care – PPO | Admitting: Gastroenterology

## 2014-08-08 VITALS — BP 106/66 | HR 56 | Temp 97.9°F | Resp 12 | Ht 63.0 in | Wt 115.0 lb

## 2014-08-08 DIAGNOSIS — Z1211 Encounter for screening for malignant neoplasm of colon: Secondary | ICD-10-CM

## 2014-08-08 DIAGNOSIS — Z8371 Family history of colonic polyps: Secondary | ICD-10-CM

## 2014-08-08 DIAGNOSIS — K5730 Diverticulosis of large intestine without perforation or abscess without bleeding: Secondary | ICD-10-CM

## 2014-08-08 MED ORDER — SODIUM CHLORIDE 0.9 % IV SOLN: 500.0000 mL | INTRAVENOUS | Status: DC

## 2014-08-08 NOTE — Op Note (Signed)
[facility]  [address]. [place], [address]   COLONOSCOPY PROCEDURE REPORT  PATIENT: [name], [name]  MR#: [id number] BIRTHDATE: 02/05/1951 , 62  yrs. old GENDER: female ENDOSCOPIST: Inda Castle, MD REFERRED BY: PROCEDURE DATE:  08/08/2014 PROCEDURE:   Colonoscopy, diagnostic First Screening Colonoscopy - Avg.  risk and is 50 yrs.  old or older - No.  Prior Negative Screening - Now for repeat screening. 10 or more years since last screening  History of Adenoma - Now for follow-up colonoscopy & has been > or = to 3 yrs.  N/A  Polyps Removed Today? No.  Recommend repeat exam, <10 yrs? No. ASA CLASS:   Class II INDICATIONS:average risk for colon cancer. MEDICATIONS: Monitored anesthesia care and Propofol 200 mg IV  DESCRIPTION OF PROCEDURE:   After the risks benefits and alternatives of the procedure were thoroughly explained, informed consent was obtained.  The digital rectal exam revealed no abnormalities of the rectum.   The [id number]  endoscope was introduced through the anus and advanced to the cecum, which was identified by both the appendix and ileocecal valve. No adverse events experienced.   The quality of the prep was excellent using Suprep  The instrument was then slowly withdrawn as the colon was fully examined.      COLON FINDINGS: There was mild diverticulosis noted in the proximal transverse colon and sigmoid colon.   The colon mucosa was otherwise normal.  Retroflexed views revealed no abnormalities. The time to cecum=5 minutes 59 seconds.  Withdrawal time=6 minutes 28 seconds.  The scope was withdrawn and the procedure completed. COMPLICATIONS: There were no complications.  ENDOSCOPIC IMPRESSION: 1.   Mild diverticulosis was noted in the proximal transverse colon and sigmoid colon 2.   The colon mucosa was otherwise normal  RECOMMENDATIONS: Continue current colorectal screening recommendations for "routine risk"  patients with a repeat colonoscopy in 10 years.  eSigned:  Inda Castle, MD 08/08/2014 2:35 PM   cc: Garret Reddish, MD   PATIENT NAME:  Brenda Hudson, Brenda Hudson MR#: [id number]

## 2014-08-08 NOTE — Patient Instructions (Signed)

## 2014-08-08 NOTE — Progress Notes (Signed)
Report to PACU, RN, vss, BBS= Clear.  

## 2014-08-09 ENCOUNTER — Telehealth: Payer: Self-pay | Admitting: *Deleted

## 2014-08-09 NOTE — Telephone Encounter (Signed)
  Follow up Call-  Call back number 08/08/2014  Post procedure Call Back phone  # [phone]  Permission to leave phone message Yes     Patient questions:  Do you have a fever, pain , or abdominal swelling? No. Pain Score  0 *  Have you tolerated food without any problems? Yes.    Have you been able to return to your normal activities? Yes.    Do you have any questions about your discharge instructions: Diet   No. Medications  No. Follow up visit  No.  Do you have questions or concerns about your Care? No.  Actions: * If pain score is 4 or above: No action needed, pain <4.

## 2014-08-19 ENCOUNTER — Ambulatory Visit (INDEPENDENT_AMBULATORY_CARE_PROVIDER_SITE_OTHER): Payer: 59 | Admitting: Psychology

## 2014-08-19 DIAGNOSIS — F4321 Adjustment disorder with depressed mood: Secondary | ICD-10-CM

## 2014-09-09 ENCOUNTER — Ambulatory Visit (INDEPENDENT_AMBULATORY_CARE_PROVIDER_SITE_OTHER): Payer: 59 | Admitting: Psychology

## 2014-09-09 DIAGNOSIS — F4321 Adjustment disorder with depressed mood: Secondary | ICD-10-CM

## 2014-09-10 ENCOUNTER — Ambulatory Visit (INDEPENDENT_AMBULATORY_CARE_PROVIDER_SITE_OTHER): Payer: BC Managed Care – PPO | Admitting: Cardiology

## 2014-09-10 ENCOUNTER — Encounter: Payer: Self-pay | Admitting: Cardiology

## 2014-09-10 VITALS — BP 114/72 | HR 60 | Ht 63.0 in | Wt 117.0 lb

## 2014-09-10 DIAGNOSIS — R002 Palpitations: Secondary | ICD-10-CM

## 2014-09-10 DIAGNOSIS — I491 Atrial premature depolarization: Secondary | ICD-10-CM

## 2014-09-10 DIAGNOSIS — Z23 Encounter for immunization: Secondary | ICD-10-CM

## 2014-09-10 NOTE — Patient Instructions (Addendum)
Your physician recommends that you continue on your current medications as directed. Please refer to the Current Medication list given to you today.   You have been referred to TO EP DOCTOR HERE AT OUR OFFICE- APPOINTMENT NEEDS TO BE ASAP.   YOU WILL GET YOUR FLU SHOT HERE IN OUR CLINIC TODAY    Your physician wants you to follow-up in: [place] will receive a reminder letter in the mail two months in advance. If you don't receive a letter, please call our office to schedule the follow-up appointment.

## 2014-09-10 NOTE — Progress Notes (Signed)
Patient ID: JULIANAH MARCIEL, female   DOB: [birth year], 63 y.o.   MRN: [id number]    Patient Name: BILLIEJEAN SCHIMEK Date of Encounter: 09/10/2014  Primary Care Provider:  Garret Reddish, MD Primary Cardiologist:  Dorothy Spark (previously Dr Verl Blalock)  Problem List   Past Medical History  Diagnosis Date  . MVP (mitral valve prolapse)   . Palpitations   . Polymyalgia rheumatica   . Allergy   . Anxiety   . Hx of echocardiogram     a. Echo 12/13:  EF 55-60%, mild MR, normal diast fxn, no MVP  . Spinal stenosis   . Frequent PVCs     and PAC per pt/has had along time  . Rectal pain   . IBS (irritable bowel syndrome)    Past Surgical History  Procedure Laterality Date  . Dilation and curettage of uterus    . Tonsillectomy     Allergies  Allergies  Allergen Reactions  . Cefaclor     REACTION: rash  . Penicillins     REACTION: rash  . Sulfonamide Derivatives     REACTION: swelling   HPI  Mrs Stierwalt turns today for evaluation and management of her palpitations and chest pain. She had extensive evaluation with a Holter monitor, 2-D echocardiogram, as well as a exercise treadmill. All of these were unremarkable and her echo did not show mitral valve prolapse. She has some PACs and some brief atrial runs on her Holter but they did not correlate with symptoms.  She is coming after 1 year. She ha had a very difficult year as her husband passed away in 2013-03-10. He was an avid runner in perfect health. He died of SCD while running and couldn't be resuscitated.  She continues to have palpitations, that are increasing in frequency and are currently happening on a daily basis. On few occasions she felt busy with it. She still experiences chest pain that is retrosternal can be associated with or without exertion has no radiation. There is no obvious alleviating or aggravating factors.   09/10/2014 - the patient is coming in for 6 months, she has had completely normal  echocardiogram, calcium score 0, in no CAD or coronary CT. The patient underwent 48 hour Holter monitoring did show 2300 PACs it 24 hours, PACs appear to be monomorphic. She was started on Toprol-XL 25 daily daily was later increased to 50 mg daily without any significant improvement in her symptoms. She is quite symptomatic with palpitations that are associated with chest pain and on occasions also with dizziness. No syncope. Occasionally her palpitations would last minutes.  Home Medications  Prior to Admission medications   Medication Sig Start Date End Date Taking? Authorizing Provider  Ascorbic Acid (VITAMIN C) 100 MG tablet Take 100 mg by mouth daily.   Yes Historical Provider, MD  metoprolol succinate (TOPROL-XL) 50 MG 24 hr tablet TAKE 1/2 TABLET BY MOUTH EVERY DAY 02/01/13  Yes Renella Cunas, MD  naproxen (NAPROSYN) 250 MG tablet Take 250 mg by mouth 2 (two) times daily with a meal.   Yes Historical Provider, MD  Na Sulfate-K Sulfate-Mg Sulf SOLN Take 1 kit by mouth once. suprep as directed. No substitutions. 05/17/13   Inda Castle, MD    Family History  Family History  Problem Relation Age of Onset  . Sleep apnea Mother   . Esophageal cancer Neg Hx   . Rectal cancer Neg Hx   . Stomach cancer Neg Hx   .  Colon cancer Neg Hx   . Diabetes Father   . Kidney disease Father   . Heart disease Father   . Colon polyps Father     Social History  History   Social History  . Marital Status: Widowed    Spouse Name: N/A    Number of Children: N/A  . Years of Education: N/A   Occupational History  . Not on file.   Social History Main Topics  . Smoking status: Never Smoker   . Smokeless tobacco: Never Used  . Alcohol Use: No  . Drug Use: No  . Sexual Activity: Not on file   Other Topics Concern  . Not on file   Social History Narrative  . No narrative on file     Review of Systems, as per HPI, otherwise negative General:  No chills, fever, night sweats or weight  changes.  Cardiovascular:  No chest pain, dyspnea on exertion, edema, orthopnea, palpitations, paroxysmal nocturnal dyspnea. Dermatological: No rash, lesions/masses Respiratory: No cough, dyspnea Urologic: No hematuria, dysuria Abdominal:   No nausea, vomiting, diarrhea, bright red blood per rectum, melena, or hematemesis Neurologic:  No visual changes, wkns, changes in mental status. All other systems reviewed and are otherwise negative except as noted above.  Physical Exam  Blood pressure 114/72, pulse 60, height _0  (1.6 m), weight 117 lb (53.071 kg).  General: Pleasant, NAD Psych: Normal affect. Neuro: Alert and oriented X 3. Moves all extremities spontaneously. HEENT: Normal  Neck: Supple without bruits or JVD. Lungs:  Resp regular and unlabored, CTA. Heart: RRR no s3, s4, or murmurs. Abdomen: Soft, non-tender, non-distended, BS + x 4.  Extremities: No clubbing, cyanosis or edema. DP/PT/Radials 2+ and equal bilaterally.  Labs:  No results found for this basename: CKTOTAL, CKMB, TROPONINI,  in the last 72 hours Lab Results  Component Value Date   WBC 5.3 08/28/2013   HGB 13.3 08/28/2013   HCT 39.2 08/28/2013   MCV 89.2 08/28/2013   PLT 244.0 08/28/2013   No results found for this basename: NA, K, CL, CO2, BUN, CREATININE, CALCIUM, LABALBU, PROT, BILITOT, ALKPHOS, ALT, AST, GLUCOSE,  in the last 168 hours Lab Results  Component Value Date   CHOL 189 08/28/2013   HDL 78.30 08/28/2013   LDLCALC 103* 08/28/2013   TRIG 38.0 08/28/2013   Lab Results  Component Value Date   DDIMER 0.26 12/20/2011   Accessory Clinical Findings  Echocardiogram - 10/30/2012  - Left ventricle: The cavity size was normal. Wall thickness was normal. Systolic function was normal. The estimated ejection fraction was in the range of 55% to 60%. Wall motion was normal; there were no regional wall motion abnormalities. Left ventricular diastolic function parameters were normal. - Mitral valve:  Mild regurgitation. - Left atrium: The atrium was normal in size.  ECG - Sinus rhythm with sinus arrhythmia, left axis deviation low-voltage QRS and abnormal EKG.  Coronary CT 01/2014 Coronary Arteries: Right dominance.  Left main originated in the left sinus of Valsalva. Left main is  free of plague. Left main gives rise to LAD and LCX.  LAD is a moderate caliber vessel that gives rise to 2 diagonal  branches. There is no plague in the proximal and mid LAD and in  diagonal branches, the distal LAD read is affected by significant  motion.  LCX is a small non-dominant vessel that gives rise to one obtuse  marginal branch and further continues as a very small vessel in the  AV groove.  There is no disease in the proximal LCX.  RCA is a large dominant vessel that gives rise to a large RV acute  marginal branch, PDA and PLVB. The proximal and mid RCA is free of  plague, the distal portion and sub-branches are affected by  significant motion.  IMPRESSION:  1. Coronary calcium score of 0. This was 0 percentile for age and  sex matched control.  2. Right dominance. Normal origin of coronary vessels. No evidence  of CAD in the proximal vessels. The read of distal portions are  affected by significant motion.  Ena Dawley    Assessment & Plan  Pleasant 63 year old female  1. Palpitations - symptomatic with chest pain and occasionally with dizziness, 2300 monomorphic PACs in 1 day, almost no change in weight Toprol-XL 50 mg daily. We will refer to EP for potential ablation. Electrolytes and TSH was normal.   2. Chest pain - with some typical and some atypical features. Patient had a prior negative treadmill test, however she continues having symptoms. Her risk factors include slightly elevated LDL cholesterol and family history of coronary artery disease. Her calcium score was 0 and coronary CT showed normal coronaries. Based on new meta-analysis from [facility] study the patient can be  reclassified and doesn't require any statin for hyperlipidemia.  3. BP - controlled  Follow-up in 1 year, referral to EP  Dorothy Spark, MD, [facility] 09/10/2014, 10:44 AM

## 2014-09-23 ENCOUNTER — Ambulatory Visit: Payer: 59 | Admitting: Psychology

## 2014-09-26 ENCOUNTER — Ambulatory Visit (INDEPENDENT_AMBULATORY_CARE_PROVIDER_SITE_OTHER): Payer: 59 | Admitting: Psychology

## 2014-09-26 DIAGNOSIS — F4321 Adjustment disorder with depressed mood: Secondary | ICD-10-CM

## 2014-09-27 ENCOUNTER — Ambulatory Visit (INDEPENDENT_AMBULATORY_CARE_PROVIDER_SITE_OTHER): Payer: 59 | Admitting: Internal Medicine

## 2014-09-27 ENCOUNTER — Encounter: Payer: Self-pay | Admitting: Internal Medicine

## 2014-09-27 VITALS — BP 100/60 | HR 59 | Ht 63.5 in | Wt 117.0 lb

## 2014-09-27 DIAGNOSIS — R002 Palpitations: Secondary | ICD-10-CM

## 2014-09-27 MED ORDER — FLECAINIDE ACETATE 50 MG PO TABS: 50.0000 mg | ORAL_TABLET | Freq: Two times a day (BID) | ORAL | Status: DC

## 2014-09-27 NOTE — Patient Instructions (Signed)
Your physician recommends that you schedule a follow-up appointment in: 10 days for an EKG in nurse room on a day when Dr. Lovena Le is here and 4 weeks with a GXT  Your physician has recommended you make the following change in your medication:  1) Start Flecainide 50mg  twice daily

## 2014-09-29 NOTE — Assessment & Plan Note (Signed)
We discussed the treatment options with the patient. One option would be reassurance alone. Another would be to continue her beta blocker and another would be to add a low dose of flecainide. While she understands that her palpitations are likely benign, she is symptomatic and would like to try and improve on her symptoms. She will start flecainide 50 mg twice a day. She will undergo exercise testing and will followup with me in several months.

## 2014-09-29 NOTE — Progress Notes (Signed)
HPI Brenda Hudson is referred today for evaluation of palpitations and documented PAC's and PVC's. She is  a pleasant 62 yo woman with a h/o palpitations and non-exertional chest pain dating back 40 years. She has had documented PVC's but less than 2000 a day. She also has frequent PAC's. She has not had syncope. She is anxious as her husband who was in very good health died suddenly approx. 18 months ago. She exercises regularly and does yardwork. She walks 3-4 miles a day without difficulty. She does not think that exertion makes her symptoms worse. She denies peripheral edema.  Allergies  Allergen Reactions  . Cefaclor     REACTION: rash  . Penicillins     REACTION: rash  . Sulfonamide Derivatives     REACTION: swelling     Current Outpatient Prescriptions  Medication Sig Dispense Refill  . metoprolol succinate (TOPROL-XL) 50 MG 24 hr tablet Take 1 tablet (50 mg total) by mouth daily. Take with or immediately following a meal. 30 tablet 10  . naproxen (NAPROSYN) 250 MG tablet Take 250 mg by mouth daily.     . flecainide (TAMBOCOR) 50 MG tablet Take 1 tablet (50 mg total) by mouth 2 (two) times daily. 180 tablet 3   No current facility-administered medications for this visit.     Past Medical History  Diagnosis Date  . MVP (mitral valve prolapse)   . Palpitations   . Polymyalgia rheumatica   . Allergy   . Anxiety   . Hx of echocardiogram     a. Echo 12/13:  EF 55-60%, mild MR, normal diast fxn, no MVP  . Spinal stenosis   . Frequent PVCs     and PAC per pt/has had along time  . Rectal pain   . IBS (irritable bowel syndrome)     ROS:   All systems reviewed and negative except as noted in the HPI.   Past Surgical History  Procedure Laterality Date  . Dilation and curettage of uterus    . Tonsillectomy       Family History  Problem Relation Age of Onset  . Sleep apnea Mother   . Esophageal cancer Neg Hx   . Rectal cancer Neg Hx   . Stomach cancer Neg Hx    . Colon cancer Neg Hx   . Diabetes Father   . Kidney disease Father   . Heart disease Father   . Colon polyps Father      History   Social History  . Marital Status: Widowed    Spouse Name: N/A    Number of Children: N/A  . Years of Education: N/A   Occupational History  . Not on file.   Social History Main Topics  . Smoking status: Never Smoker   . Smokeless tobacco: Never Used  . Alcohol Use: No  . Drug Use: No  . Sexual Activity: Not on file   Other Topics Concern  . Not on file   Social History Narrative     BP 100/60 mmHg  Pulse 59  Ht 5' 3.5" (1.613 m)  Wt 117 lb (53.071 kg)  BMI 20.40 kg/m2  Physical Exam:  Well appearing middle aged woman, NAD HEENT: Unremarkable Neck:  No JVD, no thyromegally Back:  No CVA tenderness Lungs:  Clear with no wheezes, or rhonchi HEART:  Regular rate rhythm, no murmurs, no rubs, no clicks Abd:  soft, positive bowel sounds, no organomegally, no rebound, no guarding Ext:  2 plus pulses, no edema, no cyanosis, no clubbing Skin:  No rashes no nodules Neuro:  CN II through XII intact, motor grossly intact  ECG - NSR  Assess/Plan:

## 2014-09-29 NOTE — Assessment & Plan Note (Signed)
I have tried to reassure the patient about the benign nature of her symptoms. She will continue her current meds. We discussed the possibility of starting an anti-arrhythmic drugs.

## 2014-09-30 ENCOUNTER — Ambulatory Visit (INDEPENDENT_AMBULATORY_CARE_PROVIDER_SITE_OTHER): Payer: 59 | Admitting: Family Medicine

## 2014-09-30 ENCOUNTER — Encounter: Payer: Self-pay | Admitting: Family Medicine

## 2014-09-30 VITALS — BP 90/62 | HR 60 | Temp 98.0°F | Wt 118.0 lb

## 2014-09-30 DIAGNOSIS — M5442 Lumbago with sciatica, left side: Secondary | ICD-10-CM

## 2014-09-30 DIAGNOSIS — I491 Atrial premature depolarization: Secondary | ICD-10-CM

## 2014-09-30 DIAGNOSIS — M5441 Lumbago with sciatica, right side: Secondary | ICD-10-CM

## 2014-09-30 DIAGNOSIS — M545 Low back pain, unspecified: Secondary | ICD-10-CM | POA: Insufficient documentation

## 2014-09-30 DIAGNOSIS — I341 Nonrheumatic mitral (valve) prolapse: Secondary | ICD-10-CM | POA: Insufficient documentation

## 2014-09-30 DIAGNOSIS — K589 Irritable bowel syndrome without diarrhea: Secondary | ICD-10-CM | POA: Insufficient documentation

## 2014-09-30 NOTE — Assessment & Plan Note (Addendum)
Symptoms improving on flecainide. Patient very pleased. Will follow up with cardiology.

## 2014-09-30 NOTE — Progress Notes (Signed)
Brenda Reddish, MD Phone: [phone]  Subjective:  Patient presents today to establish care with me as their new primary care provider. Patient was formerly a patient of Dr. Leanne Chang. Chief complaint-noted.   PAC/palpitations-improving control constant palpitations on a daily basis since 63 years old. Followed by Dr. Meda Coffee and on beta blocker and eventually saw Dr. Joie Bimler. Changed to flecainide yesterday and states the symptoms are mildly better.  Thought things were worse in the last year as dealing with the loss of her husband 1.5 years ago. There are periods now where she does not feel them at all but then will get some "runs" and will get mild chest pain.  ROS- no shortness of breath  Low back pain from spinal stenosis Naproxen once a day. Lumbar Spinal stenosis. Occasional tingling in legs or feet.  Ros- No weakness. No fecal or urinary incontinence  The following were reviewed and entered/updated in epic: Past Medical History  Diagnosis Date  . MVP (mitral valve prolapse)   . Palpitations   . Polymyalgia rheumatica   . Allergy   . Anxiety   . Hx of echocardiogram     a. Echo 12/13:  EF 55-60%, mild MR, normal diast fxn, no MVP  . Spinal stenosis   . Frequent PVCs     and PAC per pt/has had along time  . Rectal pain   . IBS (irritable bowel syndrome)    Patient Active Problem List   Diagnosis Date Noted  . PAC (premature atrial contraction) 09/10/2014    Priority: High  . Low back pain 09/30/2014    Priority: Low  . IBS (irritable bowel syndrome) 09/30/2014    Priority: Low  . Mitral valve prolapse 09/30/2014    Priority: Low  . Palpitations 09/10/2014    Priority: Low  . Polymyalgia rheumatica 03/21/2008    Priority: Low   Past Surgical History  Procedure Laterality Date  . Dilation and curettage of uterus    . Tonsillectomy      Family History  Problem Relation Age of Onset  . Sleep apnea Mother   . Esophageal cancer Neg Hx   . Rectal cancer  Neg Hx   . Stomach cancer Neg Hx   . Colon cancer Neg Hx   . Diabetes Father     in 21s  . Kidney disease Father     DM related, renal failure  . Heart disease Father     CVA young age, Smoker, overweight  . Colon polyps Father     Medications- reviewed and updated Current Outpatient Prescriptions  Medication Sig Dispense Refill  . flecainide (TAMBOCOR) 50 MG tablet Take 1 tablet (50 mg total) by mouth 2 (two) times daily. 180 tablet 3  . metoprolol succinate (TOPROL-XL) 50 MG 24 hr tablet Take 1 tablet (50 mg total) by mouth daily. Take with or immediately following a meal. 30 tablet 10  . naproxen (NAPROSYN) 250 MG tablet Take 250 mg by mouth daily.      No current facility-administered medications for this visit.    Allergies-reviewed and updated Allergies  Allergen Reactions  . Cefaclor     REACTION: rash  . Penicillins     REACTION: rash  . Sulfonamide Derivatives     REACTION: swelling    History   Social History  . Marital Status: Widowed    Spouse Name: N/A    Number of Children: N/A  . Years of Education: N/A   Social History Main Topics  .  Smoking status: Never Smoker   . Smokeless tobacco: Never Used  . Alcohol Use: No  . Drug Use: No  . Sexual Activity: Not on file   Other Topics Concern  . Not on file   Social History Narrative   Widowed in 2013/02/14 (husband sudden death MI despite being in great shape). 2 daughters. Oldest daughter pregnant in 09/2014-Phd student at [employer] in early [place]. Younger daughter [name] grad student [employer] [place] in [place].       [employer]. Both she and her husband. Husband went to [employer] for lawschool. 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 Oropharynx normal. Good dentition.  CV: RRR no murmurs rubs or gallops (no murmur with MVP or ectopic beats specically)  Lungs: CTAB no crackles, wheeze, rhonchi Abdomen: soft/nontender/nondistended/normal bowel sounds.  Ext: no edema Skin: warm, dry, no rash Neuro: grossly normal, moves all extremities  Assessment/Plan:  PAC (premature atrial contraction) Symptoms improving on flecainide. Patient very pleased. Will follow up with cardiology.   Low back pain From spinal stenosis. No red flags. We discussed that naproxen once daily has cardiac and renal risks. Her GFR was fine most recently and is under the care of cardiology with upcoming stress test.

## 2014-09-30 NOTE — Patient Instructions (Signed)
Wonderful to meet you!   I am glad the flecainide is working better for you.   We will see each other in a year unless you need me sooner. Physical at that time, bloodwork 1 week before. I will see you with your mom in about a month.

## 2014-09-30 NOTE — Assessment & Plan Note (Signed)
From spinal stenosis. No red flags. We discussed that naproxen once daily has cardiac and renal risks. Her GFR was fine most recently and is under the care of cardiology with upcoming stress test.

## 2014-10-08 ENCOUNTER — Ambulatory Visit (INDEPENDENT_AMBULATORY_CARE_PROVIDER_SITE_OTHER): Payer: 59 | Admitting: *Deleted

## 2014-10-08 ENCOUNTER — Encounter: Payer: Self-pay | Admitting: *Deleted

## 2014-10-08 VITALS — BP 98/58 | HR 54 | Wt 116.1 lb

## 2014-10-08 DIAGNOSIS — R002 Palpitations: Secondary | ICD-10-CM

## 2014-10-08 NOTE — Progress Notes (Signed)
1.) Reason for visit: EKG ; started Flecainide 50 mg BID  2.) Name of MD requesting visit: Dr. Cristopher Peru  3.) H&P: Hx of palpitations and documented PAC's and PVC's  4.) ROS related to problem: No c/o today.  States palpitations are much better.  Still has them but less frequently.  Has a cold now and is taking Day Time cold medicine.  States she feels good. EKG showed sinus brady.  She is scheduled for EXT on 10/25/14.  5.) Assessment and plan per MD: Reviewed with Dr. Lovena Le.  He advises to continue Flecainide 50 mg BID and to call if develops any problems

## 2014-10-08 NOTE — Patient Instructions (Signed)
Continue taking Flecainide 50 mg twice a day.  Scheduled for treadmill on 12/11 at 3:30.  Call if develops any problems or palpitations increase.

## 2014-10-18 ENCOUNTER — Ambulatory Visit (INDEPENDENT_AMBULATORY_CARE_PROVIDER_SITE_OTHER): Payer: 59 | Admitting: Psychology

## 2014-10-18 DIAGNOSIS — F4321 Adjustment disorder with depressed mood: Secondary | ICD-10-CM

## 2014-10-23 ENCOUNTER — Telehealth (HOSPITAL_COMMUNITY): Payer: Self-pay

## 2014-10-23 NOTE — Telephone Encounter (Signed)
Encounter complete. 

## 2014-10-24 ENCOUNTER — Telehealth (HOSPITAL_COMMUNITY): Payer: Self-pay

## 2014-10-24 NOTE — Telephone Encounter (Signed)
Encounter complete. 

## 2014-10-25 ENCOUNTER — Ambulatory Visit (HOSPITAL_COMMUNITY)
Admission: RE | Admit: 2014-10-25 | Discharge: 2014-10-25 | Disposition: A | Discharge status: Home / Self Care | Payer: 59 | Source: Ambulatory Visit | Attending: Cardiology | Admitting: Cardiology

## 2014-10-25 DIAGNOSIS — I4949 Other premature depolarization: Secondary | ICD-10-CM

## 2014-10-25 DIAGNOSIS — R002 Palpitations: Secondary | ICD-10-CM | POA: Diagnosis not present

## 2014-10-25 DIAGNOSIS — R079 Chest pain, unspecified: Secondary | ICD-10-CM | POA: Diagnosis not present

## 2014-10-25 NOTE — Procedures (Signed)
Exercise Treadmill Test Test  Exercise Tolerance Test Ordering MD: Cristopher Peru, MD  Interpreting MD:   Unique Test No: 1   Treadmill:  1  Indication for ETT: Palpitations-Patient taking flecainide  Contraindication to ETT: No   Stress Modality: exercise - treadmill  Cardiac Imaging Performed: non   Protocol: standard Bruce - maximal  Max BP:  156/108  Max MPHR (bpm):  159 85% MPR (bpm):  135  MPHR obtained (bpm): 155 % MPHR obtained  97  Reached 85% MPHR (min:sec): 9:30 Total Exercise Time (min-sec):  10:21  Workload in METS:  12.30 Borg Scale:  Reason ETT Terminated:  patient's desire to stop    ST Segment Analysis At Rest: Sinus bradycardia, RVCD. With Exercise: no evidence of significant ST depression  Other Information Arrhythmia:  No Angina during ETT:  present (1) Quality of ETT:  diagnostic  ETT Interpretation:  normal - no evidence of ischemia by ST analysis  Comments: Exercise treadmill with good exercise tolerance (10:21); normal BP response; patient complained of chest pain with exertion; no ST changes; no exercise induced arrhythmias; negative adequate ETT.

## 2014-11-18 ENCOUNTER — Ambulatory Visit: Payer: 59 | Admitting: Psychology

## 2014-11-21 ENCOUNTER — Ambulatory Visit (INDEPENDENT_AMBULATORY_CARE_PROVIDER_SITE_OTHER): Payer: 59 | Admitting: Psychology

## 2014-11-21 DIAGNOSIS — F4321 Adjustment disorder with depressed mood: Secondary | ICD-10-CM

## 2014-12-09 ENCOUNTER — Ambulatory Visit: Payer: 59 | Admitting: Psychology

## 2014-12-13 ENCOUNTER — Ambulatory Visit (INDEPENDENT_AMBULATORY_CARE_PROVIDER_SITE_OTHER): Payer: 59 | Admitting: Psychology

## 2014-12-13 DIAGNOSIS — F4321 Adjustment disorder with depressed mood: Secondary | ICD-10-CM

## 2014-12-27 ENCOUNTER — Ambulatory Visit (INDEPENDENT_AMBULATORY_CARE_PROVIDER_SITE_OTHER): Payer: 59 | Admitting: Psychology

## 2014-12-27 DIAGNOSIS — F4321 Adjustment disorder with depressed mood: Secondary | ICD-10-CM

## 2015-01-10 ENCOUNTER — Ambulatory Visit: Payer: 59 | Admitting: Psychology

## 2015-01-16 ENCOUNTER — Ambulatory Visit (INDEPENDENT_AMBULATORY_CARE_PROVIDER_SITE_OTHER): Payer: 59 | Admitting: Psychology

## 2015-01-16 DIAGNOSIS — F4321 Adjustment disorder with depressed mood: Secondary | ICD-10-CM | POA: Diagnosis not present

## 2015-02-08 ENCOUNTER — Other Ambulatory Visit: Payer: Self-pay | Admitting: Cardiology

## 2015-02-11 ENCOUNTER — Ambulatory Visit (INDEPENDENT_AMBULATORY_CARE_PROVIDER_SITE_OTHER): Payer: 59 | Admitting: Psychology

## 2015-02-11 DIAGNOSIS — F4321 Adjustment disorder with depressed mood: Secondary | ICD-10-CM

## 2015-02-25 ENCOUNTER — Ambulatory Visit (INDEPENDENT_AMBULATORY_CARE_PROVIDER_SITE_OTHER): Payer: 59 | Admitting: Psychology

## 2015-02-25 DIAGNOSIS — F4321 Adjustment disorder with depressed mood: Secondary | ICD-10-CM | POA: Diagnosis not present

## 2015-03-11 ENCOUNTER — Ambulatory Visit (INDEPENDENT_AMBULATORY_CARE_PROVIDER_SITE_OTHER): Payer: 59 | Admitting: Psychology

## 2015-03-11 DIAGNOSIS — F4321 Adjustment disorder with depressed mood: Secondary | ICD-10-CM

## 2015-03-31 ENCOUNTER — Ambulatory Visit: Payer: 59 | Admitting: Psychology

## 2015-04-07 ENCOUNTER — Ambulatory Visit (INDEPENDENT_AMBULATORY_CARE_PROVIDER_SITE_OTHER): Payer: 59 | Admitting: Psychology

## 2015-04-07 DIAGNOSIS — F4321 Adjustment disorder with depressed mood: Secondary | ICD-10-CM | POA: Diagnosis not present

## 2015-04-24 ENCOUNTER — Ambulatory Visit: Payer: 59 | Admitting: Psychology

## 2015-04-25 ENCOUNTER — Ambulatory Visit (INDEPENDENT_AMBULATORY_CARE_PROVIDER_SITE_OTHER): Payer: 59 | Admitting: Family Medicine

## 2015-04-25 ENCOUNTER — Ambulatory Visit (INDEPENDENT_AMBULATORY_CARE_PROVIDER_SITE_OTHER): Payer: 59 | Admitting: Psychology

## 2015-04-25 DIAGNOSIS — Z23 Encounter for immunization: Secondary | ICD-10-CM | POA: Diagnosis not present

## 2015-04-25 DIAGNOSIS — F4321 Adjustment disorder with depressed mood: Secondary | ICD-10-CM | POA: Diagnosis not present

## 2015-05-30 ENCOUNTER — Ambulatory Visit (INDEPENDENT_AMBULATORY_CARE_PROVIDER_SITE_OTHER): Payer: 59 | Admitting: Psychology

## 2015-05-30 DIAGNOSIS — F4321 Adjustment disorder with depressed mood: Secondary | ICD-10-CM | POA: Diagnosis not present

## 2015-06-17 ENCOUNTER — Ambulatory Visit (INDEPENDENT_AMBULATORY_CARE_PROVIDER_SITE_OTHER): Payer: 59 | Admitting: Psychology

## 2015-06-17 DIAGNOSIS — F4321 Adjustment disorder with depressed mood: Secondary | ICD-10-CM | POA: Diagnosis not present

## 2015-06-30 ENCOUNTER — Ambulatory Visit: Payer: 59 | Admitting: Psychology

## 2015-07-08 ENCOUNTER — Ambulatory Visit (INDEPENDENT_AMBULATORY_CARE_PROVIDER_SITE_OTHER): Payer: 59 | Admitting: Psychology

## 2015-07-08 DIAGNOSIS — F4321 Adjustment disorder with depressed mood: Secondary | ICD-10-CM

## 2015-07-29 ENCOUNTER — Ambulatory Visit: Payer: 59 | Admitting: Psychology

## 2015-08-06 ENCOUNTER — Ambulatory Visit (INDEPENDENT_AMBULATORY_CARE_PROVIDER_SITE_OTHER): Payer: 59 | Admitting: Psychology

## 2015-08-06 DIAGNOSIS — F4321 Adjustment disorder with depressed mood: Secondary | ICD-10-CM

## 2015-08-19 ENCOUNTER — Other Ambulatory Visit: Payer: Self-pay | Admitting: Cardiology

## 2015-08-25 ENCOUNTER — Ambulatory Visit (INDEPENDENT_AMBULATORY_CARE_PROVIDER_SITE_OTHER): Payer: 59 | Admitting: Psychology

## 2015-08-25 DIAGNOSIS — F4321 Adjustment disorder with depressed mood: Secondary | ICD-10-CM

## 2015-09-08 ENCOUNTER — Ambulatory Visit (INDEPENDENT_AMBULATORY_CARE_PROVIDER_SITE_OTHER): Payer: 59 | Admitting: Psychology

## 2015-09-08 DIAGNOSIS — F4321 Adjustment disorder with depressed mood: Secondary | ICD-10-CM | POA: Diagnosis not present

## 2015-09-17 ENCOUNTER — Ambulatory Visit (INDEPENDENT_AMBULATORY_CARE_PROVIDER_SITE_OTHER): Payer: 59 | Admitting: Cardiology

## 2015-09-17 ENCOUNTER — Encounter: Payer: Self-pay | Admitting: Cardiology

## 2015-09-17 VITALS — BP 98/70 | HR 49 | Ht 63.5 in | Wt 116.6 lb

## 2015-09-17 DIAGNOSIS — I491 Atrial premature depolarization: Secondary | ICD-10-CM | POA: Diagnosis not present

## 2015-09-17 DIAGNOSIS — I341 Nonrheumatic mitral (valve) prolapse: Secondary | ICD-10-CM | POA: Diagnosis not present

## 2015-09-17 DIAGNOSIS — R072 Precordial pain: Secondary | ICD-10-CM | POA: Diagnosis not present

## 2015-09-17 DIAGNOSIS — R002 Palpitations: Secondary | ICD-10-CM | POA: Diagnosis not present

## 2015-09-17 MED ORDER — FLECAINIDE ACETATE 50 MG PO TABS: 50.0000 mg | ORAL_TABLET | Freq: Two times a day (BID) | ORAL | Status: DC

## 2015-09-17 MED ORDER — METOPROLOL SUCCINATE ER 25 MG PO TB24: 25.0000 mg | ORAL_TABLET | Freq: Every day | ORAL | Status: DC

## 2015-09-17 NOTE — Progress Notes (Signed)
Patient ID: LORRAINE CIMMINO, female   DOB: 15-Apr-1951, 64 y.o.   MRN: [id number]      HPI Mrs. Windle Guard is referred today for evaluation of palpitations and documented PAC's and PVC's. She is  a pleasant 64 yo woman with a h/o palpitations and non-exertional chest pain dating back 40 years. She has had documented PVC's but less than 2000 a day. She also has frequent PAC's. She has not had syncope. She is anxious as her husband who was in very good health died suddenly approx. 18 months ago. She exercises regularly and does yardwork. She walks 3-4 miles a day without difficulty. She does not think that exertion makes her symptoms worse. She denies peripheral edema.  I saw her a year ago and she was referred to Dr Lovena Le who started her on low dose Flecainide 50 mg po BID. She tolerates it well and her palpitations frequency significantly decreased, she feels it mostly on moderate exertion. She exercises on a regular basis, and experiences only rare chest pain not related to exertion. No DOE, LE edema or syncope. She underwent an exercise stress test  Post initiation  Of flecainide that was normal. .   Allergies  Allergen Reactions  . Cefaclor     REACTION: rash  . Penicillins     REACTION: rash  . Sulfonamide Derivatives     REACTION: swelling     Current Outpatient Prescriptions  Medication Sig Dispense Refill  . flecainide (TAMBOCOR) 50 MG tablet Take 1 tablet (50 mg total) by mouth 2 (two) times daily. 180 tablet 3  . metoprolol succinate (TOPROL-XL) 50 MG 24 hr tablet TAKE 1 TABLET BY MOUTH DAILY. TAKE WITH OR IMMEDIATELY FOLLOWING A MEAL. 30 tablet 0  . naproxen (NAPROSYN) 250 MG tablet Take 250 mg by mouth daily.      No current facility-administered medications for this visit.     Past Medical History  Diagnosis Date  . MVP (mitral valve prolapse)   . Palpitations   . Polymyalgia rheumatica ([place])   . Allergy   . Anxiety   . Hx of echocardiogram     a. Echo 12/13:  EF  55-60%, mild MR, normal diast fxn, no MVP  . Spinal stenosis   . Frequent PVCs     and PAC per pt/has had along time  . Rectal pain   . IBS (irritable bowel syndrome)     ROS:   All systems reviewed and negative except as noted in the HPI.   Past Surgical History  Procedure Laterality Date  . Dilation and curettage of uterus    . Tonsillectomy       Family History  Problem Relation Age of Onset  . Sleep apnea Mother   . Esophageal cancer Neg Hx   . Rectal cancer Neg Hx   . Stomach cancer Neg Hx   . Colon cancer Neg Hx   . Diabetes Father     in 7s  . Kidney disease Father     DM related, renal failure  . Heart disease Father     CVA young age, Smoker, overweight  . Colon polyps Father      Social History   Social History  . Marital Status: Widowed    Spouse Name: N/A  . Number of Children: N/A  . Years of Education: N/A   Occupational History  . Not on file.   Social History Main Topics  . Smoking status: Never Smoker   . Smokeless tobacco:  Never Used  . Alcohol Use: No  . Drug Use: No  . Sexual Activity: Not on file   Other Topics Concern  . Not on file   Social History Narrative   Widowed in Mar 14, 2013 (husband sudden death MI despite being in great shape). 2 daughters. Oldest daughter pregnant in 09/2014-Phd student at [employer] in early [place]. Younger daughter [name] grad student [employer] in [place].       [employer]. Both she and her husband. Husband went to [employer] for lawschool. 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, regular walking, reading, dogs (2)     BP 98/70 mmHg  Pulse 49  Ht 5' 3.5" (1.613 m)  Wt 116 lb 9.6 oz (52.889 kg)  BMI 20.33 kg/m2  Physical Exam:  Well appearing middle aged woman, NAD HEENT: Unremarkable Neck:  No JVD, no thyromegally Back:  No CVA tenderness Lungs:   Clear with no wheezes, or rhonchi HEART:  Regular rate rhythm, no murmurs, no rubs, no clicks Abd:  soft, positive bowel sounds, no organomegally, no rebound, no guarding Ext:  2 plus pulses, no edema, no cyanosis, no clubbing Skin:  No rashes no nodules Neuro:  CN II through XII intact, motor grossly intact  ECG - NSR  ECG: Sinus bradycardia, normal PR, QRS, QT intervals   Assess/Plan:  1. Palpitations - symptomatic with chest pain and occasionally with dizziness, 2300 monomorphic PACs in 1 day a year ago, improved on Toprol XL and low dose flecainide, I would decrease Toprol XL to 25 mg po daily as she is hypotensive and bradycardic (asymptomatic).  TSH was normal.   2. Chest pain - with some typical and some atypical features, now improved and only rare nonexertional. Patient had a prior negative treadmill test. Her risk factors include slightly elevated LDL cholesterol and family history of coronary artery disease. Her calcium score was 0 and coronary CT showed normal coronaries. Based on new meta-analysis from [facility] study the patient can be reclassified and doesn't require any statin for hyperlipidemia.  3. BP - controlled  Follow up in 1 year.

## 2015-09-17 NOTE — Patient Instructions (Signed)
Medication Instructions:   DECREASE YOUR TOPROL XL TO 25 MG ONCE DAILY        Follow-Up:  Your physician wants you to follow-up in: [place] will receive a reminder letter in the mail two months in advance. If you don't receive a letter, please call our office to schedule the follow-up appointment.        If you need a refill on your cardiac medications before your next appointment, please call your pharmacy.

## 2015-09-24 ENCOUNTER — Other Ambulatory Visit: Payer: Self-pay | Admitting: Internal Medicine

## 2015-09-24 ENCOUNTER — Other Ambulatory Visit: Payer: Self-pay | Admitting: Cardiology

## 2015-10-01 ENCOUNTER — Ambulatory Visit (INDEPENDENT_AMBULATORY_CARE_PROVIDER_SITE_OTHER): Payer: 59 | Admitting: Psychology

## 2015-10-01 DIAGNOSIS — F4321 Adjustment disorder with depressed mood: Secondary | ICD-10-CM | POA: Diagnosis not present

## 2015-10-15 ENCOUNTER — Ambulatory Visit (INDEPENDENT_AMBULATORY_CARE_PROVIDER_SITE_OTHER): Payer: 59 | Admitting: Psychology

## 2015-10-15 DIAGNOSIS — F4321 Adjustment disorder with depressed mood: Secondary | ICD-10-CM

## 2015-10-27 ENCOUNTER — Ambulatory Visit (INDEPENDENT_AMBULATORY_CARE_PROVIDER_SITE_OTHER): Payer: 59 | Admitting: Psychology

## 2015-10-27 DIAGNOSIS — F4321 Adjustment disorder with depressed mood: Secondary | ICD-10-CM | POA: Diagnosis not present

## 2015-11-18 ENCOUNTER — Ambulatory Visit: Payer: BLUE CROSS/BLUE SHIELD | Admitting: Psychology

## 2015-11-18 ENCOUNTER — Ambulatory Visit (INDEPENDENT_AMBULATORY_CARE_PROVIDER_SITE_OTHER): Payer: BLUE CROSS/BLUE SHIELD | Admitting: Psychology

## 2015-11-18 DIAGNOSIS — F4321 Adjustment disorder with depressed mood: Secondary | ICD-10-CM

## 2015-12-18 ENCOUNTER — Ambulatory Visit (INDEPENDENT_AMBULATORY_CARE_PROVIDER_SITE_OTHER): Payer: BLUE CROSS/BLUE SHIELD | Admitting: Psychology

## 2015-12-18 DIAGNOSIS — F4324 Adjustment disorder with disturbance of conduct: Secondary | ICD-10-CM | POA: Diagnosis not present

## 2016-01-06 ENCOUNTER — Ambulatory Visit (INDEPENDENT_AMBULATORY_CARE_PROVIDER_SITE_OTHER): Payer: BLUE CROSS/BLUE SHIELD | Admitting: Psychology

## 2016-01-06 DIAGNOSIS — F4321 Adjustment disorder with depressed mood: Secondary | ICD-10-CM

## 2016-01-23 ENCOUNTER — Ambulatory Visit (INDEPENDENT_AMBULATORY_CARE_PROVIDER_SITE_OTHER): Payer: BLUE CROSS/BLUE SHIELD | Admitting: Psychology

## 2016-01-23 DIAGNOSIS — F4321 Adjustment disorder with depressed mood: Secondary | ICD-10-CM | POA: Diagnosis not present

## 2016-02-12 ENCOUNTER — Ambulatory Visit (INDEPENDENT_AMBULATORY_CARE_PROVIDER_SITE_OTHER): Payer: BLUE CROSS/BLUE SHIELD | Admitting: Family Medicine

## 2016-02-12 ENCOUNTER — Encounter: Payer: Self-pay | Admitting: Family Medicine

## 2016-02-12 VITALS — BP 110/72 | HR 60 | Temp 98.4°F | Wt 119.0 lb

## 2016-02-12 DIAGNOSIS — J329 Chronic sinusitis, unspecified: Secondary | ICD-10-CM | POA: Diagnosis not present

## 2016-02-12 DIAGNOSIS — B9689 Other specified bacterial agents as the cause of diseases classified elsewhere: Secondary | ICD-10-CM

## 2016-02-12 DIAGNOSIS — A499 Bacterial infection, unspecified: Secondary | ICD-10-CM | POA: Diagnosis not present

## 2016-02-12 DIAGNOSIS — I491 Atrial premature depolarization: Secondary | ICD-10-CM

## 2016-02-12 MED ORDER — DOXYCYCLINE HYCLATE 100 MG PO TABS: 100.0000 mg | ORAL_TABLET | Freq: Two times a day (BID) | ORAL | Status: DC

## 2016-02-12 NOTE — Progress Notes (Signed)
PCP: Garret Reddish, MD  Subjective:  Brenda Hudson is a 65 y.o. year old very pleasant female patient who presents with sinusitis symptoms including nasal congestion, sinus tenderness, cough, hoarseness -other symptoms include: mild sore throat at times. Has dental pain when leaning forward -day of illness:21 -started: almost 3 weeks ago -Symptoms are worsening -previous treatments: mucinex DM -sick contacts/travel/risks: denies flu exposure.  Does have some seasonal allergies  ROS-denies fever, SOB, NVD  Pertinent Past Medical History-  Patient Active Problem List   Diagnosis Date Noted  . PAC (premature atrial contraction) 09/10/2014    Priority: High  . Low back pain 09/30/2014    Priority: Low  . IBS (irritable bowel syndrome) 09/30/2014    Priority: Low  . Mitral valve prolapse 09/30/2014    Priority: Low  . Palpitations 09/10/2014    Priority: Low  . Polymyalgia rheumatica ([place]) 03/21/2008    Priority: Low    Medications- reviewed  Current Outpatient Prescriptions  Medication Sig Dispense Refill  . flecainide (TAMBOCOR) 50 MG tablet Take 1 tablet (50 mg total) by mouth 2 (two) times daily. 180 tablet 3  . metoprolol succinate (TOPROL XL) 25 MG 24 hr tablet Take 1 tablet (25 mg total) by mouth daily. 90 tablet 3  . naproxen (NAPROSYN) 250 MG tablet Take 250 mg by mouth daily.      No current facility-administered medications for this visit.    Objective: BP 110/72 mmHg  Pulse 60  Temp(Src) 98.4 F (36.9 C)  Wt 119 lb (53.978 kg) Gen: NAD, resting comfortably HEENT: Turbinates erythematous with yellow drainage, TM normal, pharynx mildly erythematous with no tonsilar exudate or edema, maxillary and frontal sinus tenderness CV: RRR no murmurs rubs or gallops Lungs: CTAB no crackles, wheeze, rhonchi Ext: no edema Skin: warm, dry, no rash Neuro: grossly normal, moves all extremities  Assessment/Plan:  Sinsusitis Bacterial based on: Symptoms >10 days,  double sickening  Treatment: -considered steroid: patient opted out, can call if changes her mind -other symptomatic care with Mucinex Dm very reasonable -Antibiotic indicated: yes  Finally, we reviewed reasons to return to care including if symptoms worsen or persist or new concerns arise.  Meds ordered this encounter  Medications  . doxycycline (VIBRA-TABS) 100 MG tablet    Sig: Take 1 tablet (100 mg total) by mouth 2 (two) times daily.    Dispense:  14 tablet    Refill:  0  Higher risk patient with arrhythmia history. Initially requested azithromycin as has tolerated in past. Due to the fact she is on flecainide- we opted against this ultimately and used doxycycline

## 2016-02-12 NOTE — Patient Instructions (Signed)
Sinsusitis Bacterial based on: Symptoms >10 days, double sickening  Treatment: -considered steroid: patient opted out, can call if changes her mind -other symptomatic care with Mucinex Dm very reasonable -Antibiotic indicated: yes  Finally, we reviewed reasons to return to care including if symptoms worsen or persist or new concerns arise.  Meds ordered this encounter  Medications  . doxycycline (VIBRA-TABS) 100 MG tablet    Sig: Take 1 tablet (100 mg total) by mouth 2 (two) times daily.    Dispense:  14 tablet    Refill:  0

## 2016-02-13 ENCOUNTER — Telehealth: Payer: Self-pay | Admitting: Family Medicine

## 2016-02-13 MED ORDER — PREDNISONE 20 MG PO TABS: ORAL_TABLET | ORAL | Status: DC

## 2016-02-13 NOTE — Telephone Encounter (Signed)
Pt.notified

## 2016-02-13 NOTE — Telephone Encounter (Signed)
See below

## 2016-02-13 NOTE — Telephone Encounter (Signed)
Sent in please inform patient 

## 2016-02-13 NOTE — Telephone Encounter (Signed)
Pt saw Dr Yong Channel yesterday, dx with a sinus infection. Was instructed to call back if not better and get started on prednisone.  Pt was going to wait until Monday, but feels so bad she would like to start today.   [facility] [address]

## 2016-02-16 ENCOUNTER — Ambulatory Visit (INDEPENDENT_AMBULATORY_CARE_PROVIDER_SITE_OTHER): Payer: BLUE CROSS/BLUE SHIELD | Admitting: Psychology

## 2016-02-16 DIAGNOSIS — F4321 Adjustment disorder with depressed mood: Secondary | ICD-10-CM | POA: Diagnosis not present

## 2016-03-08 ENCOUNTER — Ambulatory Visit (INDEPENDENT_AMBULATORY_CARE_PROVIDER_SITE_OTHER): Payer: BLUE CROSS/BLUE SHIELD | Admitting: Psychology

## 2016-03-08 DIAGNOSIS — F4320 Adjustment disorder, unspecified: Secondary | ICD-10-CM | POA: Diagnosis not present

## 2016-03-29 ENCOUNTER — Ambulatory Visit (INDEPENDENT_AMBULATORY_CARE_PROVIDER_SITE_OTHER): Payer: BLUE CROSS/BLUE SHIELD | Admitting: Psychology

## 2016-03-29 DIAGNOSIS — F4321 Adjustment disorder with depressed mood: Secondary | ICD-10-CM

## 2016-04-19 ENCOUNTER — Ambulatory Visit (INDEPENDENT_AMBULATORY_CARE_PROVIDER_SITE_OTHER): Payer: BLUE CROSS/BLUE SHIELD | Admitting: Psychology

## 2016-04-19 DIAGNOSIS — F4321 Adjustment disorder with depressed mood: Secondary | ICD-10-CM

## 2016-05-28 ENCOUNTER — Ambulatory Visit (INDEPENDENT_AMBULATORY_CARE_PROVIDER_SITE_OTHER): Payer: BLUE CROSS/BLUE SHIELD | Admitting: Psychology

## 2016-05-28 DIAGNOSIS — F4321 Adjustment disorder with depressed mood: Secondary | ICD-10-CM | POA: Diagnosis not present

## 2016-06-17 ENCOUNTER — Ambulatory Visit (INDEPENDENT_AMBULATORY_CARE_PROVIDER_SITE_OTHER): Payer: BLUE CROSS/BLUE SHIELD | Admitting: Psychology

## 2016-06-17 DIAGNOSIS — F4320 Adjustment disorder, unspecified: Secondary | ICD-10-CM | POA: Diagnosis not present

## 2016-07-23 ENCOUNTER — Other Ambulatory Visit: Payer: Self-pay | Admitting: *Deleted

## 2016-07-23 DIAGNOSIS — R002 Palpitations: Secondary | ICD-10-CM

## 2016-07-23 DIAGNOSIS — I341 Nonrheumatic mitral (valve) prolapse: Secondary | ICD-10-CM

## 2016-07-23 MED ORDER — FLECAINIDE ACETATE 50 MG PO TABS: 50.0000 mg | ORAL_TABLET | Freq: Two times a day (BID) | ORAL | 0 refills | Status: DC

## 2016-07-23 MED ORDER — METOPROLOL SUCCINATE ER 25 MG PO TB24: 25.0000 mg | ORAL_TABLET | Freq: Every day | ORAL | 0 refills | Status: DC

## 2016-07-27 ENCOUNTER — Ambulatory Visit: Payer: BLUE CROSS/BLUE SHIELD | Admitting: Psychology

## 2016-08-02 ENCOUNTER — Ambulatory Visit (INDEPENDENT_AMBULATORY_CARE_PROVIDER_SITE_OTHER): Payer: BLUE CROSS/BLUE SHIELD | Admitting: Psychology

## 2016-08-02 DIAGNOSIS — F4321 Adjustment disorder with depressed mood: Secondary | ICD-10-CM

## 2016-08-05 ENCOUNTER — Ambulatory Visit (INDEPENDENT_AMBULATORY_CARE_PROVIDER_SITE_OTHER): Payer: BLUE CROSS/BLUE SHIELD

## 2016-08-05 DIAGNOSIS — Z23 Encounter for immunization: Secondary | ICD-10-CM | POA: Diagnosis not present

## 2016-08-20 ENCOUNTER — Ambulatory Visit (INDEPENDENT_AMBULATORY_CARE_PROVIDER_SITE_OTHER): Payer: BLUE CROSS/BLUE SHIELD | Admitting: Psychology

## 2016-08-20 DIAGNOSIS — F4321 Adjustment disorder with depressed mood: Secondary | ICD-10-CM

## 2016-09-15 ENCOUNTER — Ambulatory Visit (INDEPENDENT_AMBULATORY_CARE_PROVIDER_SITE_OTHER): Payer: BLUE CROSS/BLUE SHIELD | Admitting: Psychology

## 2016-09-15 DIAGNOSIS — F4321 Adjustment disorder with depressed mood: Secondary | ICD-10-CM

## 2016-09-23 ENCOUNTER — Ambulatory Visit (INDEPENDENT_AMBULATORY_CARE_PROVIDER_SITE_OTHER): Payer: BLUE CROSS/BLUE SHIELD | Admitting: Cardiology

## 2016-09-23 VITALS — BP 108/56 | HR 53 | Ht 63.5 in | Wt 117.0 lb

## 2016-09-23 DIAGNOSIS — I10 Essential (primary) hypertension: Secondary | ICD-10-CM

## 2016-09-23 DIAGNOSIS — I493 Ventricular premature depolarization: Secondary | ICD-10-CM

## 2016-09-23 DIAGNOSIS — R0600 Dyspnea, unspecified: Secondary | ICD-10-CM

## 2016-09-23 DIAGNOSIS — R0609 Other forms of dyspnea: Secondary | ICD-10-CM | POA: Diagnosis not present

## 2016-09-23 MED ORDER — FLECAINIDE ACETATE 100 MG PO TABS: 100.0000 mg | ORAL_TABLET | Freq: Two times a day (BID) | ORAL | 3 refills | Status: DC

## 2016-09-23 NOTE — Patient Instructions (Addendum)
Medication Instructions:   INCREASE YOUR FLECAINIDE TO 100 MG TWICE DAILY--INCREASE THIS ON Saturday 09/25/16    Testing/Procedures:  Your physician has requested that you have an exercise tolerance test. For further information please visit [email or web address]. Please also follow instruction sheet, as given.  THIS MUST BE SCHEDULED FOR NEXT Friday  10/01/16 PER DR NELSON FOR INCREASE IN YOUR FLECAINIDE    Follow-Up:  3 MONTHS WITH DR Meda Coffee       If you need a refill on your cardiac medications before your next appointment, please call your pharmacy.

## 2016-09-23 NOTE — Progress Notes (Signed)
Patient ID: Brenda Hudson, female   DOB: July 01, 1951, 65 y.o.   MRN: [id number]      HPI Mrs. Conner was referred today for evaluation of palpitations and documented PAC's and PVC's. She is  a pleasant 65 yo woman with a h/o palpitations and non-exertional chest pain dating back 40 years. She has had documented PVC's but less than 2000 a day. She also has frequent PAC's. She has not had syncope. She is anxious as her husband who was in very good health died suddenly approx. 18 months ago. She exercises regularly and does yardwork. She walks 3-4 miles a day without difficulty. She does not think that exertion makes her symptoms worse. She denies peripheral edema.  I saw her a year ago and she was referred to Dr Lovena Le who started her on low dose Flecainide 50 mg po BID. her symptoms have improved most recently increased again this daily and later even without extraneous she says that she and sometimes they're associated with dizziness. They last seconds to minutes. She otherwise denies lower extremity edema, orthopnea, no paroxysmal  nocturnal dyspnea, no chest pain. She states that with physical activity she has been feeling more short of breath but it is possible that she is out of shape as she has been exercising as much lately.  Allergies  Allergen Reactions  . Cefaclor     REACTION: rash  . Penicillins     REACTION: rash  . Sulfonamide Derivatives     REACTION: swelling     Current Outpatient Prescriptions  Medication Sig Dispense Refill  . metoprolol succinate (TOPROL XL) 25 MG 24 hr tablet Take 1 tablet (25 mg total) by mouth daily. 90 tablet 0  . naproxen (NAPROSYN) 250 MG tablet Take 250 mg by mouth daily.     . flecainide (TAMBOCOR) 100 MG tablet Take 1 tablet (100 mg total) by mouth 2 (two) times daily. 180 tablet 3   No current facility-administered medications for this visit.      Past Medical History:  Diagnosis Date  . Allergy   . Anxiety   . Frequent PVCs    and  PAC per pt/has had along time  . Hx of echocardiogram    a. Echo 12/13:  EF 55-60%, mild MR, normal diast fxn, no MVP  . IBS (irritable bowel syndrome)   . MVP (mitral valve prolapse)   . Palpitations   . Polymyalgia rheumatica ([place])   . Rectal pain   . Spinal stenosis     ROS:   All systems reviewed and negative except as noted in the HPI.   Past Surgical History:  Procedure Laterality Date  . DILATION AND CURETTAGE OF UTERUS    . TONSILLECTOMY       Family History  Problem Relation Age of Onset  . Sleep apnea Mother   . Esophageal cancer Neg Hx   . Rectal cancer Neg Hx   . Stomach cancer Neg Hx   . Colon cancer Neg Hx   . Diabetes Father     in 61s  . Kidney disease Father     DM related, renal failure  . Heart disease Father     CVA young age, Smoker, overweight  . Colon polyps Father      Social History   Social History  . Marital status: Widowed    Spouse name: N/A  . Number of children: N/A  . Years of education: N/A   Occupational History  . Not on file.  Social History Main Topics  . Smoking status: Never Smoker  . Smokeless tobacco: Never Used  . Alcohol use No  . Drug use: No  . Sexual activity: Not on file   Other Topics Concern  . Not on file   Social History Narrative   Widowed in 03-06-2013 (husband sudden death MI despite being in great shape). 2 daughters. Oldest daughter pregnant in 09/2014-Phd student at [employer] in early [place]. Younger daughter [name] grad student [employer] in [place].       [employer]. Both she and her husband. Husband went to [employer] for lawschool. 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, regular walking, reading, dogs (2)     BP (!) 108/56   Pulse (!) 53   Ht 5' 3.5" (1.613 m)   Wt 117 lb (53.1 kg)   BMI 20.40 kg/m   Physical Exam:  Well appearing  middle aged woman, NAD HEENT: Unremarkable Neck:  No JVD, no thyromegally Back:  No CVA tenderness Lungs:  Clear with no wheezes, or rhonchi HEART:  Regular rate rhythm, no murmurs, no rubs, no clicks Abd:  soft, positive bowel sounds, no organomegally, no rebound, no guarding Ext:  2 plus pulses, no edema, no cyanosis, no clubbing Skin:  No rashes no nodules Neuro:  CN II through XII intact, motor grossly intact  ECG - NSR, left axis deviation, incomplete right bundle branch block.  ECG: Sinus bradycardia, normal PR, QRS, QT intervals, this is unchanged from prior EKG.   Assess/Plan:  1. Palpitations - symptomatic with chest pain and occasionally with dizziness, 2300 monomorphic PACs in 1 day a year ago, improved on Toprol XL and low dose flecainide, Decrease Toprol XL to 25 mg po daily as she is hypotensive and bradycardic, we will increase flecainide to 100 mg by mouth twice a day and arrange for exercise treadmill stress test in 5-7 days. TSH was normal.   2. Chest pain - with some typical and some atypical features, now improved and only rare nonexertional. Patient had a prior negative treadmill test. Her risk factors include slightly elevated LDL cholesterol and family history of coronary artery disease. Her calcium score was 0 and coronary CT showed normal coronaries. Based on new meta-analysis from [facility] study the patient can be reclassified and doesn't require any statin for hyperlipidemia. We will arrange for exercise treadmill stress test the next week considering she is being started on increased dose of flecainide and also has worsening dyspnea on exertion.  3. Essential hypertension is well controlled on Toprol-XL.  Follow up in 3 months.

## 2016-10-01 ENCOUNTER — Ambulatory Visit (INDEPENDENT_AMBULATORY_CARE_PROVIDER_SITE_OTHER): Payer: BLUE CROSS/BLUE SHIELD

## 2016-10-01 DIAGNOSIS — I493 Ventricular premature depolarization: Secondary | ICD-10-CM

## 2016-10-01 LAB — EXERCISE TOLERANCE TEST
Estimated workload: 13.4 METS
Exercise duration (min): 12 min
Exercise duration (sec): 4 s
MPHR: 157 {beats}/min
Peak HR: 126 {beats}/min
Percent HR: 80 %
RPE: 16
Rest HR: 59 {beats}/min

## 2016-10-12 ENCOUNTER — Ambulatory Visit (INDEPENDENT_AMBULATORY_CARE_PROVIDER_SITE_OTHER): Payer: BLUE CROSS/BLUE SHIELD | Admitting: Psychology

## 2016-10-12 DIAGNOSIS — F4321 Adjustment disorder with depressed mood: Secondary | ICD-10-CM

## 2016-11-20 ENCOUNTER — Other Ambulatory Visit: Payer: Self-pay | Admitting: Cardiology

## 2016-11-20 DIAGNOSIS — I341 Nonrheumatic mitral (valve) prolapse: Secondary | ICD-10-CM

## 2016-11-20 DIAGNOSIS — R002 Palpitations: Secondary | ICD-10-CM

## 2016-11-26 ENCOUNTER — Ambulatory Visit: Payer: BLUE CROSS/BLUE SHIELD | Admitting: Psychology

## 2016-11-29 ENCOUNTER — Ambulatory Visit (INDEPENDENT_AMBULATORY_CARE_PROVIDER_SITE_OTHER): Payer: BLUE CROSS/BLUE SHIELD | Admitting: Psychology

## 2016-11-29 DIAGNOSIS — F4321 Adjustment disorder with depressed mood: Secondary | ICD-10-CM

## 2016-12-14 ENCOUNTER — Ambulatory Visit (INDEPENDENT_AMBULATORY_CARE_PROVIDER_SITE_OTHER): Payer: BLUE CROSS/BLUE SHIELD | Admitting: Adult Health

## 2016-12-14 VITALS — BP 122/72 | Temp 98.1°F | Wt 117.6 lb

## 2016-12-14 DIAGNOSIS — J0111 Acute recurrent frontal sinusitis: Secondary | ICD-10-CM | POA: Diagnosis not present

## 2016-12-14 MED ORDER — DOXYCYCLINE HYCLATE 100 MG PO CAPS: 100.0000 mg | ORAL_CAPSULE | Freq: Two times a day (BID) | ORAL | 0 refills | Status: DC

## 2016-12-14 NOTE — Progress Notes (Signed)
Subjective:    Patient ID: Brenda Hudson, female    DOB: June 19, 1951, 66 y.o.   MRN: [id number]  Sinusitis  This is a recurrent problem. The current episode started in the past 7 days. There has been no fever. Associated symptoms include congestion, coughing, ear pain (right ), headaches, sinus pressure and a sore throat. Pertinent negatives include no chills or diaphoresis.      Review of Systems  Constitutional: Positive for fatigue. Negative for chills, diaphoresis, fever and unexpected weight change.  HENT: Positive for congestion, ear pain (right ), postnasal drip, rhinorrhea, sinus pain, sinus pressure and sore throat. Negative for ear discharge.   Eyes: Negative.   Respiratory: Positive for cough.   Musculoskeletal: Negative.   Neurological: Positive for headaches.   Past Medical History:  Diagnosis Date  . Allergy   . Anxiety   . Frequent PVCs    and PAC per pt/has had along time  . Hx of echocardiogram    a. Echo 12/13:  EF 55-60%, mild MR, normal diast fxn, no MVP  . IBS (irritable bowel syndrome)   . MVP (mitral valve prolapse)   . Palpitations   . Polymyalgia rheumatica ([place])   . Rectal pain   . Spinal stenosis     Social History   Social History  . Marital status: Widowed    Spouse name: N/A  . Number of children: N/A  . Years of education: N/A   Occupational History  . Not on file.   Social History Main Topics  . Smoking status: Never Smoker  . Smokeless tobacco: Never Used  . Alcohol use No  . Drug use: No  . Sexual activity: Not on file   Other Topics Concern  . Not on file   Social History Narrative   Widowed in 02/15/2013 (husband sudden death MI despite being in great shape). 2 daughters. Oldest daughter pregnant in 09/2014-Phd student at [employer] in early [place]. Younger daughter [name] grad student [employer] in [place].       [employer]. Both she and her husband. Husband went to [employer] for lawschool.  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 DILATION AND CURETTAGE OF UTERUS    . TONSILLECTOMY      Family History  Problem Relation Age of Onset  . Sleep apnea Mother   . Esophageal cancer Neg Hx   . Rectal cancer Neg Hx   . Stomach cancer Neg Hx   . Colon cancer Neg Hx   . Diabetes Father     in 39s  . Kidney disease Father     DM related, renal failure  . Heart disease Father     CVA young age, Smoker, overweight  . Colon polyps Father     Allergies  Allergen Reactions  . Cefaclor     REACTION: rash  . Penicillins     REACTION: rash  . Sulfonamide Derivatives     REACTION: swelling    Current Outpatient Prescriptions on File Prior to Visit  Medication Sig Dispense Refill  . flecainide (TAMBOCOR) 100 MG tablet Take 1 tablet (100 mg total) by mouth 2 (two) times daily. 180 tablet 3  . metoprolol succinate (TOPROL-XL) 25 MG 24 hr tablet TAKE 1  TABLET (25 MG TOTAL) BY MOUTH DAILY. 90 tablet 0  . naproxen (NAPROSYN) 250 MG tablet Take 250 mg by mouth daily.      No current facility-administered medications on file prior to visit.     BP 122/72 (BP Location: Left Arm, Patient Position: Sitting, Cuff Size: Normal)   Temp 98.1 F (36.7 C) (Oral)   Wt 117 lb 9.6 oz (53.3 kg)   BMI 20.51 kg/m       Objective:   Physical Exam  Constitutional: She is oriented to person, place, and time. She appears well-developed and well-nourished. No distress.  HENT:  Right Ear: Hearing, tympanic membrane, external ear and ear canal normal. Tympanic membrane is not erythematous and not bulging.  Left Ear: Hearing, tympanic membrane, external ear and ear canal normal. Tympanic membrane is not erythematous.  Nose: Rhinorrhea present. No mucosal edema. Right sinus exhibits  frontal sinus tenderness. Right sinus exhibits no maxillary sinus tenderness. Left sinus exhibits frontal sinus tenderness. Left sinus exhibits no maxillary sinus tenderness.  Mouth/Throat: Uvula is midline, oropharynx is clear and moist and mucous membranes are normal.  Eyes: Conjunctivae and EOM are normal. Pupils are equal, round, and reactive to light. Left eye exhibits no discharge.  Neck: Normal range of motion. Neck supple. No thyromegaly present.  Cardiovascular: Normal rate, regular rhythm, normal heart sounds and intact distal pulses.  Exam reveals no gallop and no friction rub.   No murmur heard. Pulmonary/Chest: Effort normal and breath sounds normal. No respiratory distress. She has no wheezes. She has no rales. She exhibits no tenderness.  Lymphadenopathy:    She has no cervical adenopathy.  Neurological: She is alert and oriented to person, place, and time.  Skin: Skin is warm and dry. No rash noted. No erythema. No pallor.  Psychiatric: She has a normal mood and affect. Her behavior is normal. Judgment and thought content normal.  Nursing note and vitals reviewed.      Assessment & Plan:  1. Acute recurrent frontal sinusitis - likely viral. 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 Advised to use Flonase. If no improvement in the next week then she can start Doxycycline. Paper prescription given  - doxycycline (VIBRAMYCIN) 100 MG capsule; Take 1 capsule (100 mg total) by mouth 2 (two) times daily.  Dispense: 14 capsule; Refill: 0 - Follow up with PCP if no improvement   Dorothyann Peng, NP

## 2016-12-17 ENCOUNTER — Telehealth: Payer: Self-pay | Admitting: Family Medicine

## 2016-12-17 MED ORDER — PREDNISONE 20 MG PO TABS: ORAL_TABLET | ORAL | 0 refills | Status: DC

## 2016-12-17 NOTE — Telephone Encounter (Signed)
° °  Pt saw Tommi Rumps and he rx her a med. Pt said her sinus infection is worse and is asking if Dr Yong Channel will call her a [place]

## 2016-12-17 NOTE — Telephone Encounter (Signed)
Talked with patient. z pack high risk with her flecainide so we opted against this. Will add prednisone. augmentin cant use with pcn allergy. levaquin also grade D interaction. Very limited options here. Hopeful prednisone/doxy combo helpful enough

## 2016-12-27 ENCOUNTER — Ambulatory Visit: Payer: BLUE CROSS/BLUE SHIELD | Admitting: Psychology

## 2016-12-31 ENCOUNTER — Encounter: Payer: Self-pay | Admitting: Family Medicine

## 2016-12-31 ENCOUNTER — Ambulatory Visit (INDEPENDENT_AMBULATORY_CARE_PROVIDER_SITE_OTHER): Payer: BLUE CROSS/BLUE SHIELD | Admitting: Family Medicine

## 2016-12-31 VITALS — BP 110/78 | HR 55 | Temp 98.0°F | Ht 63.5 in | Wt 118.0 lb

## 2016-12-31 DIAGNOSIS — B9689 Other specified bacterial agents as the cause of diseases classified elsewhere: Secondary | ICD-10-CM

## 2016-12-31 DIAGNOSIS — H9201 Otalgia, right ear: Secondary | ICD-10-CM | POA: Diagnosis not present

## 2016-12-31 DIAGNOSIS — H6121 Impacted cerumen, right ear: Secondary | ICD-10-CM | POA: Diagnosis not present

## 2016-12-31 DIAGNOSIS — J329 Chronic sinusitis, unspecified: Secondary | ICD-10-CM | POA: Diagnosis not present

## 2016-12-31 MED ORDER — PREDNISONE 20 MG PO TABS: 20.0000 mg | ORAL_TABLET | Freq: Every day | ORAL | 0 refills | Status: DC

## 2016-12-31 MED ORDER — AMOXICILLIN-POT CLAVULANATE 875-125 MG PO TABS: 1.0000 | ORAL_TABLET | Freq: Two times a day (BID) | ORAL | 0 refills | Status: DC

## 2016-12-31 NOTE — Progress Notes (Signed)
Subjective:  Brenda Hudson is a 66 y.o. year old very pleasant female patient who presents for/with See problem oriented charting ROS- no fever, chills, cough, shortness of breath- just persistent right frontal and maxillary sinus pain as well as right ear pain   Past Medical History-  Patient Active Problem List   Diagnosis Date Noted  . PAC (premature atrial contraction) 09/10/2014    Priority: High  . Low back pain 09/30/2014    Priority: Low  . IBS (irritable bowel syndrome) 09/30/2014    Priority: Low  . Mitral valve prolapse 09/30/2014    Priority: Low  . Palpitations 09/10/2014    Priority: Low  . Polymyalgia rheumatica ([place]) 03/21/2008    Priority: Low    Medications- reviewed and updated Current Outpatient Prescriptions  Medication Sig Dispense Refill  . flecainide (TAMBOCOR) 100 MG tablet Take 1 tablet (100 mg total) by mouth 2 (two) times daily. 180 tablet 3  . metoprolol succinate (TOPROL-XL) 25 MG 24 hr tablet TAKE 1 TABLET (25 MG TOTAL) BY MOUTH DAILY. 90 tablet 0  . naproxen (NAPROSYN) 250 MG tablet Take 250 mg by mouth daily.     [name] amoxicillin-clavulanate (AUGMENTIN) 875-125 MG tablet Take 1 tablet by mouth 2 (two) times daily. 14 tablet 0  . predniSONE (DELTASONE) 20 MG tablet Take 1 tablet (20 mg total) by mouth daily with breakfast. 7 tablet 0   No current facility-administered medications for this visit.     Objective: BP 110/78 (BP Location: Left Arm, Patient Position: Sitting, Cuff Size: Normal)   Pulse (!) 55   Temp 98 F (36.7 C) (Oral)   Ht 5' 3.5" (1.613 m)   Wt 118 lb (53.5 kg)   SpO2 95%   BMI 20.57 kg/m  Gen: NAD TM obscured on both sides by cerumen. These were irrigated and TM was normal bilaterally after irrigation.  Still with some yellow discharge in nose and tender in right maxillary and frontal sinuses CV: RRR no murmurs rubs or gallops Lungs: CTAB no crackles, wheeze, rhonchi.  Ext: no edema Skin: warm, dry Neuro: grossly  normal, moves all extremities  Assessment/Plan:  Bacterial sinusitis Impacted cerumen of right ear (actually cleaned bilateral) S: Patient was seen by Dorothyann Peng, NP on 12/14/16 and diagnosed with sinusitis. She was treated with flonase and 7 days of doxycycline due to PCN allergy. We had considered azithromycin but would interact with flecainide so we opted out. levaquin with grade d interaction as well.   Had horrible cough. Sinuses still very problematic. Right ear most painful of all areas. Right side of face also hurts. Seems like sinus pain and discharge are worsening A/P: Very complex situtaion here- rash allergy to pcn and cephalosporins. levaquin and the macrolides would risk qt prolongation on macrolide. Sulfa allergy with "swelling" and per patient this was a significant reaction. Not a lot of other great choices. We opted for augmentin despite rash risk. Will also place on pred 20 for 7 days  To try to reduce rash risk and advised enadryl to help with rash 12.5 bid potentially.   Right otalgia was due to cerumen it appears- improved with irrigation.   Meds ordered this encounter  Medications  . amoxicillin-clavulanate (AUGMENTIN) 875-125 MG tablet    Sig: Take 1 tablet by mouth 2 (two) times daily.    Dispense:  14 tablet    Refill:  0  . predniSONE (DELTASONE) 20 MG tablet    Sig: Take 1 tablet (20 mg total)  by mouth daily with breakfast.    Dispense:  7 tablet    Refill:  0  sinusitis is established problem with medication management. Otalgia is new problem which patient was concerned about sinus infection but was in fact cerumen impaction. Medical decision making rather complex due to allergies and flecainide risk  For qt prolongation.   Return precautions advised.  Garret Reddish, MD

## 2016-12-31 NOTE — Patient Instructions (Addendum)
Due to risks with flecainide and allergy to sulfa- we opted to use augmentin which would cover both sinus infection (and ear infection but actually ear looks great- may have been the ear wax!)

## 2016-12-31 NOTE — Progress Notes (Signed)
Pre visit review using our clinic review tool, if applicable. No additional management support is needed unless otherwise documented below in the visit note. 

## 2017-01-03 ENCOUNTER — Ambulatory Visit (INDEPENDENT_AMBULATORY_CARE_PROVIDER_SITE_OTHER): Payer: BLUE CROSS/BLUE SHIELD | Admitting: Psychology

## 2017-01-03 DIAGNOSIS — F4321 Adjustment disorder with depressed mood: Secondary | ICD-10-CM

## 2017-01-04 ENCOUNTER — Ambulatory Visit (INDEPENDENT_AMBULATORY_CARE_PROVIDER_SITE_OTHER): Payer: BLUE CROSS/BLUE SHIELD | Admitting: Cardiology

## 2017-01-04 ENCOUNTER — Encounter: Payer: Self-pay | Admitting: Cardiology

## 2017-01-04 VITALS — BP 115/68 | HR 56 | Ht 63.5 in | Wt 117.2 lb

## 2017-01-04 DIAGNOSIS — R002 Palpitations: Secondary | ICD-10-CM

## 2017-01-04 DIAGNOSIS — I493 Ventricular premature depolarization: Secondary | ICD-10-CM

## 2017-01-04 DIAGNOSIS — I10 Essential (primary) hypertension: Secondary | ICD-10-CM

## 2017-01-04 DIAGNOSIS — R072 Precordial pain: Secondary | ICD-10-CM

## 2017-01-04 NOTE — Patient Instructions (Signed)

## 2017-01-04 NOTE — Progress Notes (Signed)
Patient ID: Brenda Hudson, female   DOB: Jun 16, 1951, 66 y.o.   MRN: [id number]      HPI Brenda Hudson was referred today for evaluation of palpitations and documented PAC's and PVC's. She is  a pleasant 66 yo woman with a h/o palpitations and non-exertional chest pain dating back 40 years. She has had documented PVC's but less than 2000 a day. She also has frequent PAC's. She has not had syncope. She is anxious as her husband who was in very good health died suddenly approx. 18 months ago. She exercises regularly and does yardwork. She walks 3-4 miles a day without difficulty. She does not think that exertion makes her symptoms worse. She denies peripheral edema.  I saw her a year ago and she was referred to Dr Lovena Le who started her on low dose Flecainide 50 mg po BID. her symptoms have improved most recently increased again this daily and later even without extraneous she says that she and sometimes they're associated with dizziness. They last seconds to minutes. She otherwise denies lower extremity edema, orthopnea, no paroxysmal  nocturnal dyspnea, no chest pain. She states that with physical activity she has been feeling more short of breath but it is possible that she is out of shape as she has been exercising as much lately.  01/04/2017 - 3 months follow up, the patient was started on Flecainide 100 mg PO BID and decrease metoprolol, she underwent an exercise stress testing that was negative for ischemia and had stable ECG . The patient states that since her medication was adjusted she feels significantly better with significant decrease in amount of palpitations. She denies any presyncope or syncope. She is asking questions about testing her children since she is suspicious that her husband might have had a long QT syndrome. 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   Allergies  Allergen Reactions  . Cefaclor     REACTION: rash  .  Penicillins     REACTION: rash  . Sulfonamide Derivatives     REACTION: swelling     Current Outpatient Prescriptions  Medication Sig Dispense Refill  . amoxicillin-clavulanate (AUGMENTIN) 875-125 MG tablet Take 1 tablet by mouth 2 (two) times daily. 14 tablet 0  . flecainide (TAMBOCOR) 100 MG tablet Take 1 tablet (100 mg total) by mouth 2 (two) times daily. 180 tablet 3  . metoprolol succinate (TOPROL-XL) 25 MG 24 hr tablet Take 25 mg by mouth every morning.    . naproxen (NAPROSYN) 250 MG tablet Take 250 mg by mouth daily.     . predniSONE (DELTASONE) 20 MG tablet Take 1 tablet (20 mg total) by mouth daily with breakfast. 7 tablet 0   No current facility-administered medications for this visit.      Past Medical History:  Diagnosis Date  . Allergy   . Anxiety   . Frequent PVCs    and PAC per pt/has had along time  . Hx of echocardiogram    a. Echo 12/13:  EF 55-60%, mild MR, normal diast fxn, no MVP  . IBS (irritable bowel syndrome)   . MVP (mitral valve prolapse)   . Palpitations   . Polymyalgia rheumatica ([place])   . Rectal pain   . Spinal stenosis     ROS:   All systems reviewed and negative except as noted in the HPI.   Past Surgical History:  Procedure Laterality Date  . DILATION AND CURETTAGE OF UTERUS    .  TONSILLECTOMY       Family History  Problem Relation Age of Onset  . Sleep apnea Mother   . Esophageal cancer Neg Hx   . Rectal cancer Neg Hx   . Stomach cancer Neg Hx   . Colon cancer Neg Hx   . Diabetes Father     in 38s  . Kidney disease Father     DM related, renal failure  . Heart disease Father     CVA young age, Smoker, overweight  . Colon polyps Father      Social History   Social History  . Marital status: Widowed    Spouse name: N/A  . Number of children: N/A  . Years of education: N/A   Occupational History  . Not on file.   Social History Main Topics  . Smoking status: Never Smoker  . Smokeless tobacco: Never Used  .  Alcohol use No  . Drug use: No  . Sexual activity: Not on file   Other Topics Concern  . Not on file   Social History Narrative   Widowed in 02-05-2013 (husband sudden death MI despite being in great shape). 2 daughters. Oldest daughter pregnant in 09/2014-Phd student at [employer] in early [place]. Younger daughter [name] grad student [employer] in [place].       [employer]. Both she and her husband. Husband went to [employer] for lawschool. 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, regular walking, reading, dogs (2)     BP 115/68   Pulse (!) 56   Ht 5' 3.5" (1.613 m)   Wt 117 lb 3.2 oz (53.2 kg)   BMI 20.44 kg/m   Physical Exam:  Well appearing middle aged woman, NAD HEENT: Unremarkable Neck:  No JVD, no thyromegally Back:  No CVA tenderness Lungs:  Clear with no wheezes, or rhonchi HEART:  Regular rate rhythm, no murmurs, no rubs, no clicks Abd:  soft, positive bowel sounds, no organomegally, no rebound, no guarding Ext:  2 plus pulses, no edema, no cyanosis, no clubbing Skin:  No rashes no nodules Neuro:  CN II through XII intact, motor grossly intact  ECG - NSR, left axis deviation, incomplete right bundle branch block.  ECG: Sinus bradycardia, normal PR, QRS, QT intervals, this is unchanged from prior EKG.   Assess/Plan:  1. Palpitations - symptomatic with chest pain and occasionally with dizziness, 2300 monomorphic PACs in 1 day a year ago, improved on Toprol XL and low dose flecainide, Decrease Toprol XL to 25 mg po daily as she is hypotensive and bradycardic, we increased flecainide to 100 mg by mouth twice a day with improvement of her symptoms and normal excess stress test in November 2017. We'll continue the same management.   2. Chest pain - with some typical and some atypical features, now improved and only rare  nonexertional. Patient had a prior negative treadmill test. Her risk factors include slightly elevated LDL cholesterol and family history of coronary artery disease. Her calcium score was 0 and coronary CT showed normal coronaries. Based on new meta-analysis from [facility] study the patient can be reclassified and doesn't require any statin for hyperlipidemia. We will arrange for exercise treadmill stress test the next week considering she is being started on increased dose of flecainide and also has worsening dyspnea on exertion.  3. Essential hypertension is  well controlled on Toprol-XL.  Follow up in 6 months.

## 2017-01-26 ENCOUNTER — Ambulatory Visit (INDEPENDENT_AMBULATORY_CARE_PROVIDER_SITE_OTHER): Payer: BLUE CROSS/BLUE SHIELD | Admitting: Psychology

## 2017-01-26 DIAGNOSIS — F4321 Adjustment disorder with depressed mood: Secondary | ICD-10-CM

## 2017-02-21 ENCOUNTER — Ambulatory Visit (INDEPENDENT_AMBULATORY_CARE_PROVIDER_SITE_OTHER): Payer: BLUE CROSS/BLUE SHIELD | Admitting: Psychology

## 2017-02-21 DIAGNOSIS — F4321 Adjustment disorder with depressed mood: Secondary | ICD-10-CM | POA: Diagnosis not present

## 2017-02-28 ENCOUNTER — Other Ambulatory Visit: Payer: Self-pay | Admitting: Cardiology

## 2017-02-28 ENCOUNTER — Other Ambulatory Visit: Payer: Self-pay | Admitting: *Deleted

## 2017-02-28 DIAGNOSIS — I341 Nonrheumatic mitral (valve) prolapse: Secondary | ICD-10-CM

## 2017-02-28 DIAGNOSIS — R002 Palpitations: Secondary | ICD-10-CM

## 2017-03-15 ENCOUNTER — Ambulatory Visit (INDEPENDENT_AMBULATORY_CARE_PROVIDER_SITE_OTHER): Payer: BLUE CROSS/BLUE SHIELD | Admitting: Psychology

## 2017-03-15 DIAGNOSIS — F4321 Adjustment disorder with depressed mood: Secondary | ICD-10-CM

## 2017-04-07 ENCOUNTER — Ambulatory Visit (INDEPENDENT_AMBULATORY_CARE_PROVIDER_SITE_OTHER): Payer: BLUE CROSS/BLUE SHIELD | Admitting: Psychology

## 2017-04-07 DIAGNOSIS — F4321 Adjustment disorder with depressed mood: Secondary | ICD-10-CM

## 2017-05-25 ENCOUNTER — Ambulatory Visit (INDEPENDENT_AMBULATORY_CARE_PROVIDER_SITE_OTHER): Payer: BLUE CROSS/BLUE SHIELD | Admitting: Psychology

## 2017-05-25 DIAGNOSIS — F4321 Adjustment disorder with depressed mood: Secondary | ICD-10-CM

## 2017-06-30 ENCOUNTER — Ambulatory Visit: Payer: BLUE CROSS/BLUE SHIELD | Admitting: Psychology

## 2017-08-18 ENCOUNTER — Telehealth: Payer: Self-pay | Admitting: Cardiology

## 2017-08-18 ENCOUNTER — Other Ambulatory Visit: Payer: Self-pay | Admitting: *Deleted

## 2017-08-18 DIAGNOSIS — I493 Ventricular premature depolarization: Secondary | ICD-10-CM

## 2017-08-18 MED ORDER — FLECAINIDE ACETATE 100 MG PO TABS: 100.0000 mg | ORAL_TABLET | Freq: Two times a day (BID) | ORAL | 0 refills | Status: DC

## 2017-08-18 NOTE — Telephone Encounter (Signed)
New message      *STAT* If patient is at the pharmacy, call can be transferred to refill team.   1. Which medications need to be refilled? (please list name of each medication and dose if known)  flecainide (TAMBOCOR) 100 MG tablet Take 1 tablet (100 mg total) by mouth 2 (two) times daily     2. Which pharmacy/location (including street and city if local pharmacy) is medication to be sent to? [facility] [address] [place]  3. Do they need a 30 day or 90 day supply? [place]

## 2017-08-19 ENCOUNTER — Ambulatory Visit (INDEPENDENT_AMBULATORY_CARE_PROVIDER_SITE_OTHER): Payer: BLUE CROSS/BLUE SHIELD | Admitting: Psychology

## 2017-08-19 DIAGNOSIS — F4321 Adjustment disorder with depressed mood: Secondary | ICD-10-CM | POA: Diagnosis not present

## 2017-09-15 ENCOUNTER — Ambulatory Visit: Payer: Self-pay | Admitting: Cardiology

## 2017-09-26 ENCOUNTER — Ambulatory Visit (INDEPENDENT_AMBULATORY_CARE_PROVIDER_SITE_OTHER): Payer: BLUE CROSS/BLUE SHIELD | Admitting: Psychology

## 2017-09-26 DIAGNOSIS — F4321 Adjustment disorder with depressed mood: Secondary | ICD-10-CM | POA: Diagnosis not present

## 2017-10-24 ENCOUNTER — Ambulatory Visit: Payer: BLUE CROSS/BLUE SHIELD | Admitting: Psychology

## 2017-11-21 ENCOUNTER — Ambulatory Visit: Payer: Self-pay | Admitting: Cardiology

## 2017-11-22 ENCOUNTER — Ambulatory Visit: Payer: BLUE CROSS/BLUE SHIELD | Admitting: Psychology

## 2017-11-25 ENCOUNTER — Other Ambulatory Visit: Payer: Self-pay | Admitting: Cardiology

## 2017-11-25 DIAGNOSIS — I341 Nonrheumatic mitral (valve) prolapse: Secondary | ICD-10-CM

## 2017-11-25 DIAGNOSIS — R002 Palpitations: Secondary | ICD-10-CM

## 2017-12-15 ENCOUNTER — Ambulatory Visit (INDEPENDENT_AMBULATORY_CARE_PROVIDER_SITE_OTHER): Payer: BLUE CROSS/BLUE SHIELD | Admitting: Psychology

## 2017-12-15 DIAGNOSIS — F4321 Adjustment disorder with depressed mood: Secondary | ICD-10-CM | POA: Diagnosis not present

## 2017-12-17 ENCOUNTER — Other Ambulatory Visit: Payer: Self-pay | Admitting: Cardiology

## 2017-12-17 DIAGNOSIS — I493 Ventricular premature depolarization: Secondary | ICD-10-CM

## 2017-12-19 ENCOUNTER — Ambulatory Visit: Payer: BLUE CROSS/BLUE SHIELD | Admitting: Psychology

## 2018-01-12 ENCOUNTER — Ambulatory Visit: Payer: BLUE CROSS/BLUE SHIELD | Admitting: Psychology

## 2018-01-20 ENCOUNTER — Ambulatory Visit: Payer: BLUE CROSS/BLUE SHIELD | Admitting: Psychology

## 2018-01-30 ENCOUNTER — Ambulatory Visit: Payer: BLUE CROSS/BLUE SHIELD | Admitting: Psychology

## 2018-02-02 ENCOUNTER — Ambulatory Visit (INDEPENDENT_AMBULATORY_CARE_PROVIDER_SITE_OTHER): Payer: BLUE CROSS/BLUE SHIELD | Admitting: Psychology

## 2018-02-02 DIAGNOSIS — F4321 Adjustment disorder with depressed mood: Secondary | ICD-10-CM | POA: Diagnosis not present

## 2018-02-16 ENCOUNTER — Ambulatory Visit: Payer: Self-pay | Admitting: Cardiology

## 2018-02-28 DIAGNOSIS — R079 Chest pain, unspecified: Secondary | ICD-10-CM | POA: Insufficient documentation

## 2018-02-28 NOTE — Progress Notes (Deleted)
Cardiology Office Note    Date:  02/28/2018   ID:  Brenda Hudson, Brenda Hudson 03/17/1951, MRN [id number]  PCP:  [name], MD  Cardiologist: No primary care provider on file. EPS Dr. Lovena Le No chief complaint on file.   History of Present Illness:  Brenda Hudson is a 67 y.o. female with history of palpitations and documented PACs 2300 in aday and PVCs of less than 12-Feb-1999 and day.  She was started on flecainide 50 mg twice daily by Dr. Lovena Le.  Exercise stress testing was negative for ischemia and stable EKG.  Last saw Dr. Meda Coffee 12/2016 and was asking about her children being tested for prolonged QT syndrome since she thinks this is what her husband died of.  Toprol was decreased to 25 mg a day because of hypotension and bradycardia.  Patient also has chronic chest pain with typical and atypical features but negative stress test.  Calcium score was 0 and CT scan showing normal coronaries.  Flecainide was increased to 100 mg twice daily and follow-up GXT was ordered but not done.    Past Medical History:  Diagnosis Date  . Allergy   . Anxiety   . Frequent PVCs    and PAC per pt/has had along time  . Hx of echocardiogram    a. Echo 12/13:  EF 55-60%, mild MR, normal diast fxn, no MVP  . IBS (irritable bowel syndrome)   . MVP (mitral valve prolapse)   . Palpitations   . Polymyalgia rheumatica ([place])   . Rectal pain   . Spinal stenosis     Past Surgical History:  Procedure Laterality Date  . DILATION AND CURETTAGE OF UTERUS    . TONSILLECTOMY      Current Medications: No outpatient medications have been marked as taking for the 03/08/18 encounter (Appointment) with Imogene Burn, PA-C.     Allergies:   Cefaclor; Penicillins; and Sulfonamide derivatives   Social History   Socioeconomic History  . Marital status: Widowed    Spouse name: Not on file  . Number of children: Not on file  . Years of education: Not on file  . Highest education level: Not on file    Occupational History  . Not on file  Social Needs  . Financial resource strain: Not on file  . Food insecurity:    Worry: Not on file    Inability: Not on file  . Transportation needs:    Medical: Not on file    Non-medical: Not on file  Tobacco Use  . Smoking status: Never Smoker  . Smokeless tobacco: Never Used  Substance and Sexual Activity  . Alcohol use: No  . Drug use: No  . Sexual activity: Not on file  Lifestyle  . Physical activity:    Days per week: Not on file    Minutes per session: Not on file  . Stress: Not on file  Relationships  . Social connections:    Talks on phone: Not on file    Gets together: Not on file    Attends religious service: Not on file    Active member of club or organization: Not on file    Attends meetings of clubs or organizations: Not on file    Relationship status: Not on file  Other Topics Concern  . Not on file  Social History Narrative   Widowed in Feb 11, 2013 (husband sudden death MI despite being in great shape). 2 daughters. Oldest daughter pregnant in 09/2014-Phd student  at [employer] in [place]. Younger daughter [name] grad student [employer] in [place].       [employer]. Both she and her husband. Husband went to [employer] for lawschool. 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   ROS:   Please see the history of present illness.    ROS All other systems reviewed and are negative.   PHYSICAL EXAM:   VS:  There were no vitals taken for this visit.  Physical Exam  GEN: Well nourished, well developed, in no acute distress  HEENT: normal  Neck: no  JVD, carotid bruits, or masses Cardiac:RRR; no murmurs, rubs, or gallops  Respiratory:  clear to auscultation bilaterally, normal work of breathing GI: soft, nontender, nondistended, + BS Ext: without cyanosis, clubbing, or edema, Good distal pulses bilaterally MS: no deformity or atrophy  Skin: warm and dry, no rash Neuro:  Alert and Oriented x 3, Strength and sensation are intact Psych: euthymic mood, full affect  Wt Readings from Last 3 Encounters:  01/04/17 117 lb 3.2 oz (53.2 kg)  12/31/16 118 lb (53.5 kg)  12/14/16 117 lb 9.6 oz (53.3 kg)      Studies/Labs Reviewed:   EKG:  EKG is*** ordered today.  The ekg ordered today demonstrates ***  Recent Labs: No results found for requested labs within last 8760 hours.   Lipid Panel    Component Value Date/Time   CHOL 189 08/28/2013 0822   TRIG 38.0 08/28/2013 0822   HDL 78.30 08/28/2013 0822   CHOLHDL 2 08/28/2013 0822   VLDL 7.6 08/28/2013 0822   LDLCALC 103 (H) 08/28/2013 0822   LDLDIRECT 149.9 01/20/2011 0815    Additional studies/ records that were reviewed today include:  Coronary CT 2015Coronary Arteries:  Right dominance.   Left main originated in the left sinus of Valsalva. Left main is free of plague. Left main gives rise to LAD and LCX.   LAD is a moderate caliber vessel that gives rise to 2 diagonal branches. There is no plague in the proximal and mid LAD and in diagonal branches, the distal LAD read is affected by significant motion.   LCX is a small non-dominant vessel that gives rise to one obtuse marginal branch and further continues as a very small vessel in the AV groove. There is no disease in the proximal LCX.   RCA is a large dominant vessel that gives rise to a large RV acute marginal branch, PDA and PLVB. The proximal and mid RCA is free of plague, the distal portion and sub-branches are affected by significant motion.   IMPRESSION: 1. Coronary calcium score of 0. This was 0 percentile for  age and sex matched control.   2. Right dominance. Normal origin of coronary vessels. No evidence of CAD in the proximal vessels. The read of distal portions are affected by significant motion.   Ena Dawley   Electronically Signed: By: Ena Dawley On: 02/12/2014 17:59   2D echo 2013 Study Conclusions  - Left ventricle: The cavity size was normal. Wall thickness   was normal. Systolic function was normal. The estimated  ejection fraction was in the range of 55% to 60%. Wall   motion was normal; there were no regional wall motion   abnormalities. Left ventricular diastolic function   parameters were normal. - Mitral valve: Mild regurgitation. Transthoracic echocardiography.  M-mode, complete 2D, spectral Doppler, and color Doppler.  Height:  Height: 162.6cm. Height: 64in.  Weight:  Weight: 54.9kg. Weight: 120.7lb.  Body mass index:  BMI: 20.8kg/m^2.  Body surface area:    BSA: 1.55m^2.  Blood pressure:     117/71.  Patient status:  Outpatient.  Location:  [facility]    ASSESSMENT:    1. PAC (premature atrial contraction)   2. Chest pain, unspecified type      PLAN:  In order of problems listed above:  PACs with palpitations controlled with flecainide 100 mg twice daily  Chest pain long history of this normal coronary CT 2015    Medication Adjustments/Labs and Tests Ordered: Current medicines are reviewed at length with the patient today.  Concerns regarding medicines are outlined above.  Medication changes, Labs and Tests ordered today are listed in the Patient Instructions below. There are no Patient Instructions on file for this visit.   Sumner Boast, PA-C  02/28/2018 3:27 PM    [facility] [address] [place], [place], [place]  [address] Phone: [phone]; Fax: [phone]

## 2018-03-03 ENCOUNTER — Other Ambulatory Visit: Payer: Self-pay | Admitting: Cardiology

## 2018-03-03 DIAGNOSIS — I341 Nonrheumatic mitral (valve) prolapse: Secondary | ICD-10-CM

## 2018-03-03 DIAGNOSIS — R002 Palpitations: Secondary | ICD-10-CM

## 2018-03-06 ENCOUNTER — Ambulatory Visit (INDEPENDENT_AMBULATORY_CARE_PROVIDER_SITE_OTHER): Payer: BLUE CROSS/BLUE SHIELD | Admitting: Psychology

## 2018-03-06 DIAGNOSIS — F4321 Adjustment disorder with depressed mood: Secondary | ICD-10-CM

## 2018-03-08 ENCOUNTER — Ambulatory Visit: Payer: Self-pay | Admitting: Physician Assistant

## 2018-03-27 ENCOUNTER — Ambulatory Visit: Payer: BLUE CROSS/BLUE SHIELD | Admitting: Psychology

## 2018-03-27 ENCOUNTER — Other Ambulatory Visit: Payer: Self-pay | Admitting: Cardiology

## 2018-03-27 DIAGNOSIS — I493 Ventricular premature depolarization: Secondary | ICD-10-CM

## 2018-04-04 DIAGNOSIS — I493 Ventricular premature depolarization: Secondary | ICD-10-CM | POA: Insufficient documentation

## 2018-04-04 NOTE — Progress Notes (Signed)
Cardiology Office Note    Date:  04/05/2018   ID:  Brenda, Hudson 1951/03/31, MRN [id number]  PCP:  Brenda Olp, MD  Cardiologist: Brenda Dawley, MD EPS Brenda Hudson  Chief Complaint  Patient presents with  . Follow-up    History of Present Illness:  Brenda Hudson is a 67 y.o. female with history of palpitations and documented PVCs & 2300 monomorphic PACs in 1 day 2015 treated with flecainide follow-up exercise stress testing negative for ischemia.  Last saw Dr. Meda Hudson 12/2016 at which time she was hypotensive and bradycardic and Toprol was decreased and flecainide increased.[name]  History of chest pain with coronary CT 2015 showing normal coronaries and calcium score 0.  Patient comes in today accompanied by her daughter service dog.  Overall she has not had any changes in her symptoms in the past year.  She gets occasional palpitations associated with chest tightness but it is very manageable and has not gotten worse.  She denies any exertional chest pain, dyspnea, dyspnea on exertion, dizziness or presyncope.  Blood pressure still runs low but she has no symptoms.  She stays active taking care of her 4 horses.     Past Medical History:  Diagnosis Date  . Allergy   . Anxiety   . Frequent PVCs    and PAC per pt/has had along time  . Hx of echocardiogram    a. Echo 12/13:  EF 55-60%, mild MR, normal diast fxn, no MVP  . IBS (irritable bowel syndrome)   . MVP (mitral valve prolapse)   . Palpitations   . Polymyalgia rheumatica ([place])   . Rectal pain   . Spinal stenosis     Past Surgical History:  Procedure Laterality Date  . DILATION AND CURETTAGE OF UTERUS    . TONSILLECTOMY      Current Medications: Current Meds  Medication Sig  . flecainide (TAMBOCOR) 100 MG tablet TAKE 1 TABLET BY MOUTH TWICE A DAY  . metoprolol succinate (TOPROL-XL) 25 MG 24 hr tablet Take 1 tablet (25 mg total) by mouth daily. Please keep upcoming appt for future refills. Thank  you  . naproxen (NAPROSYN) 250 MG tablet Take 250 mg by mouth daily.      Allergies:   Cefaclor; Penicillins; and Sulfonamide derivatives   Social History   Socioeconomic History  . Marital status: Widowed    Spouse name: Not on file  . Number of children: Not on file  . Years of education: Not on file  . Highest education level: Not on file  Occupational History  . Not on file  Social Needs  . Financial resource strain: Not on file  . Food insecurity:    Worry: Not on file    Inability: Not on file  . Transportation needs:    Medical: Not on file    Non-medical: Not on file  Tobacco Use  . Smoking status: Never Smoker  . Smokeless tobacco: Never Used  Substance and Sexual Activity  . Alcohol use: No  . Drug use: No  . Sexual activity: Not on file  Lifestyle  . Physical activity:    Days per week: Not on file    Minutes per session: Not on file  . Stress: Not on file  Relationships  . Social connections:    Talks on phone: Not on file    Gets together: Not on file    Attends religious service: Not on file    Active member of  club or organization: Not on file    Attends meetings of clubs or organizations: Not on file    Relationship status: Not on file  Other Topics Concern  . Not on file  Social History Narrative   Widowed in 2013/01/08 (husband sudden death MI despite being in great shape). 2 daughters. Oldest daughter pregnant in 09/2014-Phd student at [employer] in early [place]. Younger daughter Brenda Hudson grad student [employer] in [employer].       [employer]. Both she and her husband. Husband went to [employer] for lawschool. 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   ROS:   Please see the history of present illness.    Review of Systems  Constitution: Negative.  HENT: Negative.   Eyes: Negative.   Cardiovascular: Positive for chest pain and palpitations.  Respiratory: Negative.   Hematologic/Lymphatic: Negative.   Musculoskeletal: Negative.  Negative for joint pain.  Gastrointestinal: Negative.   Genitourinary: Negative.   Neurological: Negative.    All other systems reviewed and are negative.   PHYSICAL EXAM:   VS:  BP 100/68   Pulse (!) 51   Ht 5' 3.5" (1.613 m)   Wt 111 lb (50.3 kg)   BMI 19.35 kg/m   Physical Exam  GEN: Well nourished, well developed, in no acute distress  Neck: no JVD, carotid bruits, or masses Cardiac:RRR; no murmurs, rubs, or gallops  Respiratory:  clear to auscultation bilaterally, normal work of breathing GI: soft, nontender, nondistended, + BS Ext: without cyanosis, clubbing, or edema, Good distal pulses bilaterally Neuro:  Alert and Oriented x 3 Psych: euthymic mood, full affect  Wt Readings from Last 3 Encounters:  04/05/18 111 lb (50.3 kg)  01/04/17 117 lb 3.2 oz (53.2 kg)  12/31/16 118 lb (53.5 kg)      Studies/Labs Reviewed:   EKG:  EKG is  ordered today.  The ekg ordered today demonstrates sinus bradycardia at 51 bpm with first-degree AV block incomplete right bundle branch block left anterior fascicular block nonspecific ST changes, no change from EKG in 09-Jan-2016  Recent Labs: No results found for requested labs within last 8760 hours.   Lipid Panel    Component Value Date/Time   CHOL 189 08/28/2013 0822   TRIG 38.0 08/28/2013 0822   HDL 78.30 08/28/2013 0822   CHOLHDL 2 08/28/2013 0822   VLDL 7.6 08/28/2013 0822   LDLCALC 103 (H) 08/28/2013 0822   LDLDIRECT 149.9 01/20/2011 0815    Additional studies/ records that were reviewed today include:  Coronary CT Jan 08, 2014  IMPRESSION: 1. Coronary calcium score of 0. This  was 0 percentile for age and sex matched control.   2. Right dominance. Normal origin of coronary vessels. No evidence of CAD in the proximal vessels. The read of distal portions are affected by significant motion.   Brenda Hudson   Electronically Signed: By: Brenda Hudson On: 02/12/2014 17:59   ASSESSMENT:    1. PAC (premature atrial contraction)   2. PVC's (premature ventricular contractions)   3. Palpitations   4. Chest pain, unspecified type      PLAN:  In order of problems listed above:  History of palpitations with PACs(2300 in 24 hrs 2015) and PVCs maintained on flecainide.  Overall patient is doing well without any change in her symptoms.  Continue current flecainide dose as well as Toprol.  She has not had surveillance labs in several years.  Will check C met CBC and flecainide level.  Follow-up with Dr. Meda Hudson in 1 year.  History of chest pain with CT 2015 calcium score 0 normal coronaries- no need for statin.  She is not fasting today so will not check lipid panel.  Chest pain only when she has palpitations.  Medication Adjustments/Labs and Tests Ordered: Current medicines are reviewed at length with the patient today.  Concerns regarding medicines are outlined above.  Medication changes, Labs and Tests ordered today are listed in the Patient Instructions below. Patient Instructions  Medication Instructions:  Your physician recommends that you continue on your current medications as directed. Please refer to the Current Medication list given to you today.   Labwork: TODAY:  CMET, CBC, TSH, & FLECAINIDE LEVEL  Testing/Procedures: None ordered  Follow-Up: Your physician wants you to follow-up in: [place] will receive a reminder letter in the mail two months in advance. If you don't receive a letter, please call our office to schedule the follow-up appointment.     Any Other Special Instructions Will Be Listed Below (If  Applicable).     If you need a refill on your cardiac medications before your next appointment, please call your pharmacy.      Sumner Boast, PA-C  04/05/2018 9:09 AM    [facility] [address], [place], [place]  [address] Phone: [phone]; Fax: [phone]

## 2018-04-05 ENCOUNTER — Encounter: Payer: Self-pay | Admitting: Physician Assistant

## 2018-04-05 ENCOUNTER — Ambulatory Visit (INDEPENDENT_AMBULATORY_CARE_PROVIDER_SITE_OTHER): Payer: BLUE CROSS/BLUE SHIELD | Admitting: Physician Assistant

## 2018-04-05 VITALS — BP 100/68 | HR 51 | Ht 63.5 in | Wt 111.0 lb

## 2018-04-05 DIAGNOSIS — R002 Palpitations: Secondary | ICD-10-CM

## 2018-04-05 DIAGNOSIS — I493 Ventricular premature depolarization: Secondary | ICD-10-CM

## 2018-04-05 DIAGNOSIS — R079 Chest pain, unspecified: Secondary | ICD-10-CM

## 2018-04-05 DIAGNOSIS — I491 Atrial premature depolarization: Secondary | ICD-10-CM

## 2018-04-05 MED ORDER — FLECAINIDE ACETATE 100 MG PO TABS: 100.0000 mg | ORAL_TABLET | Freq: Two times a day (BID) | ORAL | 3 refills | Status: DC

## 2018-04-05 NOTE — Patient Instructions (Addendum)
Medication Instructions:  Your physician recommends that you continue on your current medications as directed. Please refer to the Current Medication list given to you today.   Labwork: TODAY:  CMET, CBC, TSH, & FLECAINIDE LEVEL  Testing/Procedures: None ordered  Follow-Up: Your physician wants you to follow-up in: [place] will receive a reminder letter in the mail two months in advance. If you don't receive a letter, please call our office to schedule the follow-up appointment.     Any Other Special Instructions Will Be Listed Below (If Applicable).     If you need a refill on your cardiac medications before your next appointment, please call your pharmacy.

## 2018-04-06 NOTE — Progress Notes (Signed)
Pt has been made aware of normal result and verbalized understanding.  jw 04/06/18 Pt understands that we will call her if her Flecainide level is abnormal only,. Pt thanked me for the call

## 2018-04-07 LAB — COMPREHENSIVE METABOLIC PANEL
ALT: 11 IU/L (ref 0–32)
AST: 25 IU/L (ref 0–40)
Albumin/Globulin Ratio: 2.2 (ref 1.2–2.2)
Albumin: 4.3 g/dL (ref 3.6–4.8)
Alkaline Phosphatase: 30 IU/L — ABNORMAL LOW (ref 39–117)
BILIRUBIN TOTAL: 0.3 mg/dL (ref 0.0–1.2)
BUN/Creatinine Ratio: 20 (ref 12–28)
BUN: 15 mg/dL (ref 8–27)
CALCIUM: 9.6 mg/dL (ref 8.7–10.3)
CHLORIDE: 102 mmol/L (ref 96–106)
CO2: 26 mmol/L (ref 20–29)
Creatinine, Ser: 0.76 mg/dL (ref 0.57–1.00)
GFR calc non Af Amer: 83 mL/min/{1.73_m2} (ref >59)
GFR, EST AFRICAN AMERICAN: 96 mL/min/{1.73_m2} (ref >59)
GLUCOSE: 73 mg/dL (ref 65–99)
Globulin, Total: 2 g/dL (ref 1.5–4.5)
Potassium: 4.9 mmol/L (ref 3.5–5.2)
Sodium: 142 mmol/L (ref 134–144)
Total Protein: 6.3 g/dL (ref 6.0–8.5)

## 2018-04-07 LAB — CBC
Hematocrit: 38.1 % (ref 34.0–46.6)
Hemoglobin: 12.7 g/dL (ref 11.1–15.9)
MCH: 29.6 pg (ref 26.6–33.0)
MCHC: 33.3 g/dL (ref 31.5–35.7)
MCV: 89 fL (ref 79–97)
PLATELETS: 237 10*3/uL (ref 150–450)
RBC: 4.29 x10E6/uL (ref 3.77–5.28)
RDW: 13.8 % (ref 12.3–15.4)
WBC: 4.1 10*3/uL (ref 3.4–10.8)

## 2018-04-07 LAB — FLECAINIDE LEVEL: Flecainide: 0.7 ug/mL (ref 0.20–1.00)

## 2018-04-07 LAB — TSH: TSH: 1.47 u[IU]/mL (ref 0.450–4.500)

## 2018-04-19 ENCOUNTER — Ambulatory Visit: Payer: BLUE CROSS/BLUE SHIELD | Admitting: Psychology

## 2018-05-23 LAB — HM PAP SMEAR: HM Pap smear: NEGATIVE

## 2018-05-25 LAB — HM MAMMOGRAPHY

## 2018-05-27 ENCOUNTER — Other Ambulatory Visit: Payer: Self-pay | Admitting: Cardiology

## 2018-05-27 DIAGNOSIS — R002 Palpitations: Secondary | ICD-10-CM

## 2018-05-27 DIAGNOSIS — I341 Nonrheumatic mitral (valve) prolapse: Secondary | ICD-10-CM

## 2018-06-19 ENCOUNTER — Ambulatory Visit (INDEPENDENT_AMBULATORY_CARE_PROVIDER_SITE_OTHER): Payer: BLUE CROSS/BLUE SHIELD | Admitting: Psychology

## 2018-06-19 DIAGNOSIS — F4321 Adjustment disorder with depressed mood: Secondary | ICD-10-CM

## 2018-07-07 ENCOUNTER — Encounter: Payer: Self-pay | Admitting: Family Medicine

## 2018-07-07 ENCOUNTER — Ambulatory Visit (INDEPENDENT_AMBULATORY_CARE_PROVIDER_SITE_OTHER): Payer: BLUE CROSS/BLUE SHIELD | Admitting: Family Medicine

## 2018-07-07 VITALS — BP 92/68 | HR 60 | Temp 98.5°F | Ht 63.5 in | Wt 109.6 lb

## 2018-07-07 DIAGNOSIS — R5383 Other fatigue: Secondary | ICD-10-CM

## 2018-07-07 DIAGNOSIS — G8929 Other chronic pain: Secondary | ICD-10-CM

## 2018-07-07 DIAGNOSIS — Z114 Encounter for screening for human immunodeficiency virus [HIV]: Secondary | ICD-10-CM | POA: Diagnosis not present

## 2018-07-07 DIAGNOSIS — M79642 Pain in left hand: Secondary | ICD-10-CM

## 2018-07-07 DIAGNOSIS — R1310 Dysphagia, unspecified: Secondary | ICD-10-CM

## 2018-07-07 DIAGNOSIS — M79641 Pain in right hand: Secondary | ICD-10-CM

## 2018-07-07 DIAGNOSIS — R634 Abnormal weight loss: Secondary | ICD-10-CM | POA: Diagnosis not present

## 2018-07-07 DIAGNOSIS — M5441 Lumbago with sciatica, right side: Secondary | ICD-10-CM

## 2018-07-07 DIAGNOSIS — Z1159 Encounter for screening for other viral diseases: Secondary | ICD-10-CM

## 2018-07-07 DIAGNOSIS — M5442 Lumbago with sciatica, left side: Secondary | ICD-10-CM

## 2018-07-07 DIAGNOSIS — M48061 Spinal stenosis, lumbar region without neurogenic claudication: Secondary | ICD-10-CM

## 2018-07-07 NOTE — Addendum Note (Signed)
Addended by: Frutoso Chase A on: 07/07/2018 03:41 PM   Modules accepted: Orders

## 2018-07-07 NOTE — Patient Instructions (Addendum)
Health Maintenance Due  Topic Date Due  . Hepatitis C Screening-patient declines today 1951/06/02  . HIV Screening-patient declines today 09/03/1966  . MAMMOGRAM-done 04/15/2018-[facility]-normal per patient  Team- please call or get ROI and request these records 08/16/2015  . PAP SMEAR-done 04/15/2018-[facility]-Dr Adkins-normal per patient 08/15/2016  . INFLUENZA VACCINE - Please call for a fall flu shot/nurse visit 06/15/2018   Please stop by lab before you go  We will call you within two weeks about your referral to 1. Dr. Paulla Fore and 2. GI for consideration of endoscopy. If you do not hear within 3 weeks, give [place] a call.   If we dont find a clear cause on labs and direction id like to check in with you 3-4 weeks from now to see how you are doing

## 2018-07-07 NOTE — Addendum Note (Signed)
Addended by: Frutoso Chase A on: 07/07/2018 03:42 PM   Modules accepted: Orders

## 2018-07-07 NOTE — Progress Notes (Signed)
Subjective:  Brenda Hudson is a 67 y.o. year old very pleasant female patient who presents for/with See problem oriented charting ROS- weight loss, trouble swallowing and some pain . No fever or chills. Has hand pain, low back pain, pain into both legs.   Past Medical History-  Patient Active Problem List   Diagnosis Date Noted  . PAC (premature atrial contraction) 09/10/2014    Priority: High  . Low back pain 09/30/2014    Priority: Low  . IBS (irritable bowel syndrome) 09/30/2014    Priority: Low  . Mitral valve prolapse 09/30/2014    Priority: Low  . Palpitations 09/10/2014    Priority: Low  . Polymyalgia rheumatica ([place]) 03/21/2008    Priority: Low  . PVC's (premature ventricular contractions) 04/04/2018  . Chest pain 02/28/2018    Medications- reviewed and updated Current Outpatient Medications  Medication Sig Dispense Refill  . flecainide (TAMBOCOR) 100 MG tablet Take 1 tablet (100 mg total) by mouth 2 (two) times daily. 180 tablet 3  . metoprolol succinate (TOPROL-XL) 25 MG 24 hr tablet Take 1 tablet (25 mg total) by mouth daily. 90 tablet 2  . naproxen (NAPROSYN) 250 MG tablet Take 250 mg by mouth daily.      No current facility-administered medications for this visit.     Objective: BP 92/68 (BP Location: Left Arm, Patient Position: Sitting, Cuff Size: Normal)   Pulse 60   Temp 98.5 F (36.9 C) (Oral)   Ht 5' 3.5" (1.613 m)   Wt 109 lb 9.6 oz (49.7 kg)   SpO2 98%   BMI 19.11 kg/m  Gen: NAD, resting comfortably CV: RRR no murmurs rubs or gallops Lungs: CTAB no crackles, wheeze, rhonchi Abdomen: soft/nontender/nondistended/normal bowel sounds.  Ext: no edema Skin: warm, dry MSK: some pain with palpation of DIP, PIP, MTP joints. Patient with some midline pain in lumbar spine (states this is not new)  Neuro: no obvious weakness in hands, arms, legs  Assessment/Plan:  Fatigue Dysphagia Weight loss S:  Patient complains of fatigue and weight loss.  Fatigue going on for 6 months. In the last few months has lost 5-6 lbs despite no change in diet. Compared to February 2018 she is down 8 lbs. She feels like she could fall asleep easily. Never been told she has snored. Generally feels weaker- both upper and lower body. Some generalized weakness in arms.   More difficult to write- hard to use piano.   Has noted some pain with swallowing and feels like this is worsening- states can eat same volume of food. Solid foods generally not an issue- Liquids seem to be harder for her. Occasionally feels like liquids get stuck.   Admits to a lot of stress. Caring for mom, daughter with epilepsy living with her, husband passed in recent years.   She saw cardiology for palpitations in may- thought to be due to PACs and PVCs. Had coronary CT score of 0 so no statin advised. She is on flecainide and metoprolol- follows with Dr. Meda Coffee. She was having the fatigue at time of visit  Has weakness in bilateral hands, has severe back pain- has been treated before and been known to have lumbar stenosis. Has been seen by [facility] and sc[facility]osis center- was not a big fan of injections. Pain goes down into both legs but more on the right. Gets numbness and tingling in the feet. Stumbles at times with right foot.   Up to date on mammogram from June as well as  pap smear at same time. 07/2014 with 10 year follow up.  A/P: 67 year old female with history of PMR presenting with dysphagia, generalized weakness, hand pain, worsening radiculopathy in setting of known history lumbar stenosis.  - no morning stiffness to suggest RA - we will get cbc, cmp, tsh, esr, CRP.  - If ESR/CRP up low threshold to treat for PMR (may also help radiculopathy) - if esr/crp not up would trial prednisone at higher dose for lumbar stenosis - refer to DR. Paulla Fore for back and hand issues - screening tests with hcv, hiv screen - up to date on age based cancer screening - with pain with swallowing/trouble  swallowing in setting of weight loss refer to GI for possible endoscopy. Did discuss possibly treating reflux but with weight loss want to start with GI opinion first.  - check in 3-4 weeks if no clear cause to see how she is doing.  - need to get phq9 at follow up but dont strongly suspect depression as cause of this diffuse of a symptom set.   Future Appointments  Date Time Provider [place]  07/25/2018  4:00 PM Oren Binet, PhD [facility] None   Lab/Order associations: Weight loss - Plan: Sedimentation rate, C-reactive protein  Dysphagia, unspecified type - Plan: Ambulatory referral to Gastroenterology  Fatigue, unspecified type - Plan: CBC with Differential/Platelet, Comprehensive metabolic panel, TSH, Sedimentation rate, C-reactive protein  Screening for HIV (human immunodeficiency virus) - Plan: HIV antibody  Encounter for hepatitis C screening test for low risk patient - Plan: Hepatitis C antibody  Pain in both hands - Plan: Ambulatory referral to Sports Medicine  Spinal stenosis of lumbar region, unspecified whether neurogenic claudication present - Plan: Ambulatory referral to Sports Medicine  Chronic bilateral low back pain with bilateral sciatica - Plan: Ambulatory referral to Sports Medicine  Return precautions advised.  Garret Reddish, MD

## 2018-07-08 LAB — COMPREHENSIVE METABOLIC PANEL
AG Ratio: 1.8 (calc) (ref 1.0–2.5)
ALT: 12 U/L (ref 6–29)
AST: 22 U/L (ref 10–35)
Albumin: 4.2 g/dL (ref 3.6–5.1)
Alkaline phosphatase (APISO): 26 U/L — ABNORMAL LOW (ref 33–130)
BUN: 21 mg/dL (ref 7–25)
CO2: 30 mmol/L (ref 20–32)
CREATININE: 0.93 mg/dL (ref 0.50–0.99)
Calcium: 9.9 mg/dL (ref 8.6–10.4)
Chloride: 101 mmol/L (ref 98–110)
GLUCOSE: 129 mg/dL — AB (ref 65–99)
Globulin: 2.4 g/dL (calc) (ref 1.9–3.7)
Potassium: 4.8 mmol/L (ref 3.5–5.3)
SODIUM: 141 mmol/L (ref 135–146)
TOTAL PROTEIN: 6.6 g/dL (ref 6.1–8.1)
Total Bilirubin: 0.4 mg/dL (ref 0.2–1.2)

## 2018-07-08 LAB — CBC WITH DIFFERENTIAL/PLATELET
BASOS ABS: 19 {cells}/uL (ref 0–200)
Basophils Relative: 0.3 %
EOS PCT: 2 %
Eosinophils Absolute: 128 cells/uL (ref 15–500)
HCT: 38.6 % (ref 35.0–45.0)
Hemoglobin: 12.9 g/dL (ref 11.7–15.5)
Lymphs Abs: 1120 cells/uL (ref 850–3900)
MCH: 30.1 pg (ref 27.0–33.0)
MCHC: 33.4 g/dL (ref 32.0–36.0)
MCV: 90 fL (ref 80.0–100.0)
MONOS PCT: 7.5 %
MPV: 11.3 fL (ref 7.5–12.5)
NEUTROS PCT: 72.7 %
Neutro Abs: 4653 cells/uL (ref 1500–7800)
PLATELETS: 241 10*3/uL (ref 140–400)
RBC: 4.29 10*6/uL (ref 3.80–5.10)
RDW: 12.8 % (ref 11.0–15.0)
TOTAL LYMPHOCYTE: 17.5 %
WBC mixed population: 480 cells/uL (ref 200–950)
WBC: 6.4 10*3/uL (ref 3.8–10.8)

## 2018-07-08 LAB — HEPATITIS C ANTIBODY
HEP C AB: NONREACTIVE
SIGNAL TO CUT-OFF: 0.01 (ref <1.00)

## 2018-07-08 LAB — HIV ANTIBODY (ROUTINE TESTING W REFLEX): HIV 1&2 Ab, 4th Generation: NONREACTIVE

## 2018-07-08 LAB — SEDIMENTATION RATE: SED RATE: 6 mm/h (ref 0–30)

## 2018-07-08 LAB — TSH: TSH: 0.94 mIU/L (ref 0.40–4.50)

## 2018-07-08 LAB — C-REACTIVE PROTEIN: CRP: 0.9 mg/L (ref <8.0)

## 2018-07-10 ENCOUNTER — Other Ambulatory Visit: Payer: Self-pay | Admitting: Family Medicine

## 2018-07-10 MED ORDER — PREDNISONE 20 MG PO TABS: ORAL_TABLET | ORAL | 0 refills | Status: DC

## 2018-07-12 ENCOUNTER — Ambulatory Visit: Payer: BLUE CROSS/BLUE SHIELD | Admitting: Psychology

## 2018-07-18 ENCOUNTER — Ambulatory Visit (INDEPENDENT_AMBULATORY_CARE_PROVIDER_SITE_OTHER): Payer: BLUE CROSS/BLUE SHIELD | Admitting: Sports Medicine

## 2018-07-18 VITALS — BP 108/68 | HR 64 | Ht 63.5 in | Wt 110.6 lb

## 2018-07-18 DIAGNOSIS — M48061 Spinal stenosis, lumbar region without neurogenic claudication: Secondary | ICD-10-CM

## 2018-07-18 NOTE — Patient Instructions (Addendum)

## 2018-07-18 NOTE — Progress Notes (Signed)
PROCEDURE NOTE: THERAPEUTIC EXERCISES (97110) 15 minutes spent for Therapeutic exercises as below and as referenced in the AVS.  This included exercises focusing on stretching, strengthening, with significant focus on eccentric aspects.   Proper technique shown and discussed handout in great detail with ATC and myself.  All questions were discussed and answered.   Long term goals include an improvement in range of motion, strength, endurance as well as avoiding reinjury. Frequency of visits is one time as determined during today's  office visit. Frequency of exercises to be performed is as per handout.  EXERCISES REVIEWED:  Brenda Hudson Exercises  Thoracic Mobility  Pelvic recruitment  hip flexor stretch

## 2018-07-18 NOTE — Progress Notes (Signed)
Brenda Hudson. [place], [place] [facility] [place]  Brenda Hudson - 67 y.o. female MRN [id number]  Date of birth: May 19, 1951  Visit Date: 07/18/2018  PCP: Brenda Olp, MD   Referred by: Brenda Olp, MD  Scribe(s) for today's visit: Brenda Hudson, CMA  SUBJECTIVE:  Brenda Hudson is here for Back Pain (rt >Lt leg pain ) and Hand Pain (weakness b/l ) .  Referred by: Brenda Hudson   Her Pain in back into both legssymptoms INITIALLY: Began started about 7 years ago after fall from horse and pain comes and goes.  Described as moderate pain can be sharp to dull, radiating to the back and right greater then left leg from top of thigh to knee. She does have some calf pain and numbness in feet. She is not able to determine if it is top or bottom of foot.   Worsened with walking, working, standing Improved with changes in position.  Additional associated symptoms include: patient also has some numbness in both hands at times. She does have neck and shoulder pain on left side.     At this time symptoms are improving compared to onset she has had some improvement after prednisone and acupuncture.  She has been seen for acupuncture and has one day left of prednisone taper. She has been seen by Spine center in the past NSAID taken daily  with mild improvement Injection ESI over five years ago with no improvement Therapy has been over five yeas ago as well.  with no improvement  MRI done of lumbar spine about five years ago per patient. At the [facility].   REVIEW OF SYSTEMS: Reports night time disturbances. Has to have support under legs due to back pain.  Denies fevers, chills, or night sweats. Denies unexplained weight loss. Denies personal history of cancer. Denies changes in bowel or bladder habits. Denies recent unreported falls. Denies new or worsening dyspnea or wheezing. Denies headaches or dizziness.   Reports numbness, tingling or weakness  In the extremities. Both hands and feet  Denies dizziness or presyncopal episodes Denies lower extremity edema     HISTORY:  Prior history reviewed and updated per electronic medical record.  Social History   Occupational History  . Not on file  Tobacco Use  . Smoking status: Never Smoker  . Smokeless tobacco: Never Used  Substance and Sexual Activity  . Alcohol use: No  . Drug use: No  . Sexual activity: Not on file   Social History   Social History Narrative   Widowed in 02-09-13 (husband sudden death MI despite being in great shape). 2 daughters. Oldest daughter pregnant in 09/2014-Phd student at [employer] in early [place]. Younger daughter Brenda Hudson grad student [employer] in [place].       [employer]. Both she and her husband. Husband went to [employer] for lawschool. 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 . Prior MRI not available in epic .   OBJECTIVE:  VS:  HT:5' 3.5" (161.3 cm)   WT:110 lb 9.6 oz (50.2 kg)  BMI:19.28    BP:108/68  HR:64bpm  TEMP: ( )  RESP:96 %  PHYSICAL EXAM: CONSTITUTIONAL: Well-developed, Well-nourished and In no acute distress PSYCHIATRIC: Alert & appropriately interactive. and Not depressed or anxious appearing. RESPIRATORY: No increased work of breathing and Trachea Midline EYES: Pupils are equal., EOM intact without nystagmus. and No scleral icterus.  VASCULAR EXAM: Warm and well perfused NEURO: unremarkable  MSK Exam: BACK Exam: Normal alignment & Contours Skin: No overlying erythema/ecchymosis  MOTOR TESTING: Intact in all LE myotomes and Able to heel and toe walk without difficutly          RIGHT    LEFT Straight leg raise-------------------------: positive, mild pain                         positive, mild pain  Slump Sign--------------------------------: positive, mild pain                         positive, mild pain Popliteal compression test------------: normal, no pain                         normal, no pain Greater sciatic notch tenderness----: normal, no pain                         normal, no pain   REFLEXES Right Left  DTR - L3/4 -Patellar 2+ 2+  DTR - L5/S1 - Achilles 2+ 2+    ASSESSMENT   1. Spinal stenosis of lumbar region without neurogenic claudication     PLAN:  Pertinent additional documentation may be included in corresponding procedure notes, imaging studies, problem based documentation and patient instructions.  Procedures:  . Discussed the foundation of treatment for this condition is physical therapy and/or daily (5-6 days/week) therapeutic exercises, focusing on core strengthening, coordination, neuromuscular control/reeducation.  Therapeutic exercises prescribed per procedure note.  Medications:  No orders of the defined types were placed in this encounter.  Discussion/Instructions: No problem-specific Assessment & Plan notes found for this encounter.  . Discussed the underlying features of tight hip flexors leading to crouched, fetal like position that results in spinal column compression.  Including lumbar hyperflexion with hypermobility, thoracic flexion with restrictive rotation and cervical lordosis reversal  . Links to [employer] provided today per Patient Instructions.  These exercises were developed by Brenda Hudson, DC with a strong emphasis on core neuromuscular reducation and postural realignment through body-weight exercises. . Discussed red flag symptoms that warrant earlier emergent evaluation and patient voices understanding. . Activity modifications and the importance of avoiding exacerbating activities  (limiting pain to no more than a 4 / 10 during or following activity) recommended and discussed.  Follow-up:  . No follow-ups on file.   . If any lack of improvement consider:   . further diagnostic evaluation with Plain film x-rays and/or repeat MRI . referral to Physical Therapy  . At follow up will plan to consider: Osteopathic manipulation     CMA/ATC served as scribe during this visit. History, Physical, and Plan performed by medical provider. Documentation and orders reviewed and attested to.      Gerda Diss, [facility] Physician

## 2018-07-25 ENCOUNTER — Ambulatory Visit: Payer: BLUE CROSS/BLUE SHIELD | Admitting: Psychology

## 2018-08-01 ENCOUNTER — Encounter: Payer: Self-pay | Admitting: Sports Medicine

## 2018-08-09 LAB — HM DEXA SCAN

## 2018-08-15 ENCOUNTER — Ambulatory Visit: Payer: BLUE CROSS/BLUE SHIELD | Admitting: Psychology

## 2018-08-21 ENCOUNTER — Ambulatory Visit (INDEPENDENT_AMBULATORY_CARE_PROVIDER_SITE_OTHER): Payer: Medicare Other | Admitting: Psychology

## 2018-08-21 DIAGNOSIS — F4321 Adjustment disorder with depressed mood: Secondary | ICD-10-CM

## 2018-08-22 ENCOUNTER — Ambulatory Visit: Payer: BLUE CROSS/BLUE SHIELD | Admitting: Family Medicine

## 2018-08-25 ENCOUNTER — Ambulatory Visit: Payer: Self-pay | Admitting: Gastroenterology

## 2018-09-11 ENCOUNTER — Ambulatory Visit (INDEPENDENT_AMBULATORY_CARE_PROVIDER_SITE_OTHER): Payer: Medicare Other | Admitting: Psychology

## 2018-09-11 DIAGNOSIS — F4321 Adjustment disorder with depressed mood: Secondary | ICD-10-CM

## 2018-09-18 ENCOUNTER — Ambulatory Visit: Payer: Self-pay | Admitting: Gastroenterology

## 2018-09-27 ENCOUNTER — Ambulatory Visit (INDEPENDENT_AMBULATORY_CARE_PROVIDER_SITE_OTHER): Payer: Medicare Other | Admitting: Psychology

## 2018-09-27 DIAGNOSIS — F4321 Adjustment disorder with depressed mood: Secondary | ICD-10-CM | POA: Diagnosis not present

## 2018-10-09 ENCOUNTER — Ambulatory Visit (INDEPENDENT_AMBULATORY_CARE_PROVIDER_SITE_OTHER): Payer: Medicare Other | Admitting: Psychology

## 2018-10-09 DIAGNOSIS — F4321 Adjustment disorder with depressed mood: Secondary | ICD-10-CM

## 2018-11-01 ENCOUNTER — Encounter: Payer: Self-pay | Admitting: Gastroenterology

## 2018-11-01 ENCOUNTER — Other Ambulatory Visit: Payer: Medicare Other

## 2018-11-01 ENCOUNTER — Ambulatory Visit (INDEPENDENT_AMBULATORY_CARE_PROVIDER_SITE_OTHER): Payer: Medicare Other | Admitting: Gastroenterology

## 2018-11-01 VITALS — BP 104/66 | HR 64 | Ht 63.5 in | Wt 108.0 lb

## 2018-11-01 DIAGNOSIS — R197 Diarrhea, unspecified: Secondary | ICD-10-CM

## 2018-11-01 DIAGNOSIS — R1310 Dysphagia, unspecified: Secondary | ICD-10-CM

## 2018-11-01 NOTE — Patient Instructions (Signed)
You have been scheduled for an endoscopy. Please follow written instructions given to you at your visit today. If you use inhalers (even only as needed), please bring them with you on the day of your procedure.   Go to the basement for labs today  Trail of lactose free diet for 1 week   Use IBGard 1 capsule three times a day as needed  If you are age 65 or older, your body mass index should be between 23-30. Your Body mass index is 18.83 kg/m. If this is out of the aforementioned range listed, please consider follow up with your Primary Care Provider.  If you are age 2 or younger, your body mass index should be between 19-25. Your Body mass index is 18.83 kg/m. If this is out of the aformentioned range listed, please consider follow up with your Primary Care Provider.    Thank you for choosing [facility]  Karleen [place] Nandigam,MD

## 2018-11-01 NOTE — Progress Notes (Signed)
Brenda Hudson    [id number]    Jan 08, 1951  Primary Care Physician:[name], [name], MD  Referring Physician: [name], MD [name], [place] [address]  Chief complaint: Dysphagia  HPI: 67 year old female previously followed by Dr. Deatra Hudson, last seen in September 2015 is here to establish care  Dysphagia: with mostly liquids and pills.  No problem with food. She doesn't eat meat, is a vegetarian She feels a knot in the throat when ever she tries to swallow anything  Takes Naproxysn twice daily for back pain  Prednisone taper for back pain last month  Heartburn and acid taste few times a week.  She does not want to start PPI as she is concerned about potential side effects  Diarrhea worse in the past few weeks.  Has 3-4 bowel movements daily.  Denies any nocturnal symptoms. Drink soy milk, but eats cheese and frozen yogurt frequently  History of IBS with predominant constipation in the past.   Colonoscopy August 08, 2014 by Dr. Deatra Hudson Left-sided diverticulosis, otherwise normal exam   Outpatient Encounter Medications as of 11/01/2018  Medication Sig  . flecainide (TAMBOCOR) 100 MG tablet Take 1 tablet (100 mg total) by mouth 2 (two) times daily.  . metoprolol succinate (TOPROL-XL) 25 MG 24 hr tablet Take 1 tablet (25 mg total) by mouth daily.  . naproxen (NAPROSYN) 250 MG tablet Take 250 mg by mouth daily.   . [DISCONTINUED] predniSONE (DELTASONE) 10 MG tablet TAKE 4 TABLETS BY MOUTH FOR 3 DAYS, THEN TAKE 2 TABLET FOR 4 DAYS  . [DISCONTINUED] predniSONE (DELTASONE) 20 MG tablet Take 2 pills for 3 days, 1 pill for 4 days   No facility-administered encounter medications on file as of 11/01/2018.     Allergies as of 11/01/2018 - Review Complete 11/01/2018  Allergen Reaction Noted  . Cefaclor    . Penicillins    . Sulfonamide derivatives      Past Medical History:  Diagnosis Date  . Allergy   . Anxiety   . Frequent PVCs     and PAC per pt/has had along time  . Hx of echocardiogram    a. Echo 12/13:  EF 55-60%, mild MR, normal diast fxn, no MVP  . IBS (irritable bowel syndrome)   . MVP (mitral valve prolapse)   . Palpitations   . Polymyalgia rheumatica ([place])   . Rectal pain   . Spinal stenosis     Past Surgical History:  Procedure Laterality Date  . DILATION AND CURETTAGE OF UTERUS    . TONSILLECTOMY      Family History  Problem Relation Age of Onset  . Sleep apnea Mother   . Diabetes Father        in 53s  . Kidney disease Father        DM related, renal failure  . Heart disease Father        CVA young age, Smoker, overweight  . Colon polyps Father   . Esophageal cancer Neg Hx   . Rectal cancer Neg Hx   . Stomach cancer Neg Hx   . Colon cancer Neg Hx     Social History   Socioeconomic History  . Marital status: Widowed    Spouse name: Not on file  . Number of children: Not on file  . Years of education: Not on file  . Highest education level: Not on file  Occupational History  . Not on  file  Social Needs  . Financial resource strain: Not on file  . Food insecurity:    Worry: Not on file    Inability: Not on file  . Transportation needs:    Medical: Not on file    Non-medical: Not on file  Tobacco Use  . Smoking status: Never Smoker  . Smokeless tobacco: Never Used  Substance and Sexual Activity  . Alcohol use: No  . Drug use: No  . Sexual activity: Not on file  Lifestyle  . Physical activity:    Days per week: Not on file    Minutes per session: Not on file  . Stress: Not on file  Relationships  . Social connections:    Talks on phone: Not on file    Gets together: Not on file    Attends religious service: Not on file    Active member of club or organization: Not on file    Attends meetings of clubs or organizations: Not on file    Relationship status: Not on file  . Intimate partner violence:    Fear of current or ex partner: Not on file    Emotionally abused:  Not on file    Physically abused: Not on file    Forced sexual activity: Not on file  Other Topics Concern  . Not on file  Social History Narrative   Widowed in 03-04-2013 (husband sudden death MI despite being in great shape). 2 daughters. Oldest daughter pregnant in 09/2014-Phd student at [employer] in early [place]. Younger daughter Brenda Hudson grad student [employer] in [place].       [employer]. Both she and her husband. Husband went to [employer] for lawschool. 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  Positive for lack of energy HENT: Negative.   Eyes: Negative for blurred vision.  Respiratory: Negative for cough, shortness of breath and wheezing.   Cardiovascular: Negative for chest pain and palpitations.  Gastrointestinal: as per HPI Genitourinary: Negative for dysuria, urgency, frequency and hematuria.  Musculoskeletal: Positive for myalgias, back pain and joint pain.  Skin: Negative for itching and rash.  Neurological: Negative for dizziness, tremors, focal weakness, seizures and loss of consciousness.  Endo/Heme/Allergies: Positive for seasonal allergies.  Psychiatric/Behavioral: Negative for depression, suicidal ideas and hallucinations.  All other systems reviewed and are negative.   Physical Exam: Vitals:   11/01/18 1010  BP: 104/66  Pulse: 64   Body mass index is 18.83 kg/m. Gen:      No acute distress HEENT:  EOMI, sclera anicteric Neck:     No masses; no thyromegaly Lungs:    Clear to auscultation bilaterally; normal respiratory effort CV:         Regular rate and rhythm; no murmurs Abd:      + bowel sounds; soft, non-tender; no palpable masses, no distension Ext:    No edema; adequate peripheral  perfusion Skin:      Warm and dry; no rash Neuro: alert and oriented x 3 Psych: normal mood and affect  Data Reviewed:  Reviewed labs, radiology imaging, old records and pertinent past GI work up   Assessment and Plan/Recommendations:  66 year old female here with complaints of dysphagia to both solids and  liquids worsening in the past few months. He also has intermittent diarrhea, worse in the past 2 weeks.  History of chronic irritable bowel syndrome. We will do a trial of lactose-free diet for 1 week Check GI pathogen panel to exclude infectious etiology Fecal elastase, fecal fat and fecal lactoferrin to exclude pancreatic insufficiency and colitis Due for colorectal cancer screening September 2025 IBgard 1 capsule up to 3 times daily as needed for IBS symptoms Schedule EGD to evaluate dysphagia, possible dilation and esophageal biopsies.  Differential includes esophagitis, peptic stricture and will need to exclude neoplasia. Return in 2 to 3 months or sooner if needed  The risks and benefits as well as alternatives of endoscopic procedure(s) have been discussed and reviewed. All questions answered. The patient agrees to proceed.  Damaris Hippo , MD [phone]    CC: Brenda Olp, MD

## 2018-11-06 ENCOUNTER — Encounter: Payer: Self-pay | Admitting: Gastroenterology

## 2018-11-10 ENCOUNTER — Encounter: Payer: Self-pay | Admitting: Family Medicine

## 2018-11-10 ENCOUNTER — Ambulatory Visit (INDEPENDENT_AMBULATORY_CARE_PROVIDER_SITE_OTHER): Payer: Medicare Other | Admitting: Family Medicine

## 2018-11-10 ENCOUNTER — Ambulatory Visit: Payer: Self-pay | Admitting: Family Medicine

## 2018-11-10 ENCOUNTER — Other Ambulatory Visit: Payer: Medicare Other

## 2018-11-10 VITALS — BP 110/64 | HR 63 | Temp 98.8°F | Ht 63.5 in | Wt 107.4 lb

## 2018-11-10 DIAGNOSIS — R197 Diarrhea, unspecified: Secondary | ICD-10-CM

## 2018-11-10 DIAGNOSIS — J329 Chronic sinusitis, unspecified: Secondary | ICD-10-CM

## 2018-11-10 MED ORDER — PREDNISONE 20 MG PO TABS: 20.0000 mg | ORAL_TABLET | Freq: Every day | ORAL | 0 refills | Status: DC

## 2018-11-10 MED ORDER — IPRATROPIUM BROMIDE 0.06 % NA SOLN: 2.0000 | Freq: Four times a day (QID) | NASAL | 0 refills | Status: DC

## 2018-11-10 MED ORDER — AMOXICILLIN-POT CLAVULANATE 875-125 MG PO TABS: 1.0000 | ORAL_TABLET | Freq: Two times a day (BID) | ORAL | 0 refills | Status: DC

## 2018-11-10 NOTE — Patient Instructions (Signed)
Start the atrovent, prednisone, and augmentin.  Please stay well hydrated.  You can take tylenol and/or motrin as needed for low grade fever and pain.  Please let me know if your symptoms worsen or fail to improve.  Take care, Dr Jerline Pain

## 2018-11-10 NOTE — Progress Notes (Signed)
   Subjective:  Brenda Hudson is a 67 y.o. female who presents today for same-day appointment with a chief complaint of sinus congestion.   HPI:  Sinus Congestion, Acute problem Started about a week ago. Worsened over the last few days. Associated symptoms include rhinorrhea, facial pain, tooth pain, low-grade fevers and chills, and malaise.  She has tried taking over-the-counter NyQuil with no improvement.  No sick contacts.  Symptoms are worse in the morning.  Worse with bending over.  No other obvious alleviating or aggravating factors.  ROS: Per HPI  PMH: She reports that she has never smoked. She has never used smokeless tobacco. She reports that she does not drink alcohol or use drugs.  Objective:  Physical Exam: BP 110/64 (BP Location: Left Arm, Patient Position: Sitting, Cuff Size: Normal)   Pulse 63   Temp 98.8 F (37.1 C) (Oral)   Ht 5' 3.5" (1.613 m)   Wt 107 lb 6.4 oz (48.7 kg)   SpO2 97%   BMI 18.73 kg/m   Gen: NAD, resting comfortably HEENT: Right TM with clear effusion.  Left TM obscured by cerumen.  OP slightly erythematous with no exudate.  Nasal mucosa erythematous and boggy bilaterally with thick, green discharge. CV: RRR with no murmurs appreciated Pulm: NWOB, CTAB with no crackles, wheezes, or rhonchi   Assessment/Plan:  Sinusitis Given the symptoms been persistent for the past week and are worsening, will start Augmentin today.  She does have a history of a penicillin allergy however she was able to tolerate this well a year ago with no adverse reactions.  We will also start prednisone 20 mg daily x5 days.  Start Atrovent nasal spray as well.  Recommended good oral hydration.  Recommended Tylenol and/or Motrin as needed.  Discussed reasons to return to care and seek emergent care.  Follow-up as needed.  Algis Greenhouse. Jerline Pain, MD 11/10/2018 1:07 PM

## 2018-11-14 LAB — FECAL FAT, QUALITATIVE
Fat Qual Neutral, Stl: NORMAL
Fat Qual Total, Stl: NORMAL

## 2018-11-15 DIAGNOSIS — Z23 Encounter for immunization: Secondary | ICD-10-CM | POA: Diagnosis not present

## 2018-11-17 LAB — PANCREATIC ELASTASE, FECAL: Pancreatic Elastase-1, Stool: 451 mcg/g

## 2018-11-17 LAB — GASTROINTESTINAL PATHOGEN PANEL PCR
C. difficile Tox A/B, PCR: NOT DETECTED
Campylobacter, PCR: NOT DETECTED
Cryptosporidium, PCR: NOT DETECTED
E coli (ETEC) LT/ST PCR: NOT DETECTED
E coli (STEC) stx1/stx2, PCR: NOT DETECTED
E coli 0157, PCR: NOT DETECTED
Giardia lamblia, PCR: NOT DETECTED
Norovirus, PCR: NOT DETECTED
Rotavirus A, PCR: NOT DETECTED
SALMONELLA, PCR: NOT DETECTED
Shigella, PCR: NOT DETECTED

## 2018-11-17 LAB — FECAL LACTOFERRIN, QUANT: LACTOFERRIN, FECAL, QUANT.: 35.8 ug/mL — AB (ref 0.00–7.24)

## 2018-11-22 ENCOUNTER — Telehealth: Payer: Self-pay | Admitting: Gastroenterology

## 2018-11-22 ENCOUNTER — Ambulatory Visit (AMBULATORY_SURGERY_CENTER): Payer: Self-pay | Admitting: *Deleted

## 2018-11-22 VITALS — Ht 63.25 in | Wt 106.0 lb

## 2018-11-22 DIAGNOSIS — R1310 Dysphagia, unspecified: Secondary | ICD-10-CM

## 2018-11-22 DIAGNOSIS — R197 Diarrhea, unspecified: Secondary | ICD-10-CM

## 2018-11-22 MED ORDER — PEG-KCL-NACL-NASULF-NA ASC-C 140 G PO SOLR: 1.0000 | ORAL | 0 refills | Status: DC

## 2018-11-22 NOTE — Progress Notes (Signed)
No egg or soy allergy known to patient  No issues with past sedation with any surgeries  or procedures, no intubation problems  No diet pills per patient No home 02 use per patient  No blood thinners per patient  Pt denies issues with constipation  No A fib or A flutter  EMMI video sent to pt's e mail - pt declined  Plenvu sample LOT [id number] EXP 05/2019 as directed

## 2018-11-22 NOTE — Telephone Encounter (Signed)
See stool study results notes for details

## 2018-11-24 ENCOUNTER — Ambulatory Visit (AMBULATORY_SURGERY_CENTER): Payer: Medicare Other | Admitting: Gastroenterology

## 2018-11-24 ENCOUNTER — Encounter: Payer: Self-pay | Admitting: Gastroenterology

## 2018-11-24 VITALS — BP 105/60 | HR 72 | Temp 99.1°F | Resp 11 | Ht 63.5 in | Wt 107.0 lb

## 2018-11-24 DIAGNOSIS — R197 Diarrhea, unspecified: Secondary | ICD-10-CM

## 2018-11-24 DIAGNOSIS — K5289 Other specified noninfective gastroenteritis and colitis: Secondary | ICD-10-CM

## 2018-11-24 DIAGNOSIS — Z1211 Encounter for screening for malignant neoplasm of colon: Secondary | ICD-10-CM | POA: Diagnosis not present

## 2018-11-24 DIAGNOSIS — R1310 Dysphagia, unspecified: Secondary | ICD-10-CM

## 2018-11-24 DIAGNOSIS — K5150 Left sided colitis without complications: Secondary | ICD-10-CM | POA: Diagnosis not present

## 2018-11-24 DIAGNOSIS — K529 Noninfective gastroenteritis and colitis, unspecified: Secondary | ICD-10-CM | POA: Diagnosis not present

## 2018-11-24 DIAGNOSIS — I8510 Secondary esophageal varices without bleeding: Secondary | ICD-10-CM | POA: Diagnosis not present

## 2018-11-24 MED ORDER — SODIUM CHLORIDE 0.9 % IV SOLN: 500.0000 mL | Freq: Once | INTRAVENOUS | Status: DC

## 2018-11-24 NOTE — Op Note (Signed)
[facility] Patient Name: Brenda Hudson Procedure Date: 11/24/2018 9:23 AM MRN: [id number] Endoscopist: Mauri Pole , MD Age: 68 Referring MD:  Date of Birth: 01/10/1951 Gender: Female Account #: [id number] Procedure:                Colonoscopy Indications:              Clinically significant diarrhea of unexplained                            origin Medicines:                Monitored Anesthesia Care Procedure:                Pre-Anesthesia Assessment:                           - Prior to the procedure, a History and Physical                            was performed, and patient medications and                            allergies were reviewed. The patient's tolerance of                            previous anesthesia was also reviewed. The risks                            and benefits of the procedure and the sedation                            options and risks were discussed with the patient.                            All questions were answered, and informed consent                            was obtained. Prior Anticoagulants: The patient has                            taken no previous anticoagulant or antiplatelet                            agents. ASA Grade Assessment: II - A patient with                            mild systemic disease. After reviewing the risks                            and benefits, the patient was deemed in                            satisfactory condition to undergo the procedure.  After obtaining informed consent, the colonoscope                            was passed under direct vision. Throughout the                            procedure, the patient's blood pressure, pulse, and                            oxygen saturations were monitored continuously. The                            Colonoscope was introduced through the anus and                            advanced to the the terminal ileum, with                      identification of the appendiceal orifice and IC                            valve. The colonoscopy was performed without                            difficulty. The patient tolerated the procedure                            well. The quality of the bowel preparation was                            excellent. The ileocecal valve, appendiceal                            orifice, and rectum were photographed. Scope In: 9:42:40 AM Scope Out: 9:58:35 AM Scope Withdrawal Time: 0 hours 9 minutes 17 seconds  Total Procedure Duration: 0 hours 15 minutes 55 seconds  Findings:                 The perianal and digital rectal examinations were                            normal.                           A few scattered erosions were found in the                            ascending colon and in the cecum. Biopsies were                            taken with a cold forceps for histology.                           An area of mildly congested mucosa was found in the  entire colon. Biopsies for histology were taken                            with a cold forceps from the right colon and left                            colon for evaluation of microscopic colitis.                           Multiple small and large-mouthed diverticula were                            found in the sigmoid colon, descending colon,                            transverse colon and ascending colon.                           Non-bleeding internal hemorrhoids were found during                            retroflexion. The hemorrhoids were small.                           The terminal ileum appeared normal. Complications:            No immediate complications. Estimated Blood Loss:     Estimated blood loss was minimal. Impression:               - A few erosions in the ascending colon and in the                            cecum. Biopsied.                           - Congested mucosa in the entire  examined colon.                            Biopsied.                           - Diverticulosis in the sigmoid colon, in the                            descending colon, in the transverse colon and in                            the ascending colon.                           - Non-bleeding internal hemorrhoids.                           - The examined portion of the ileum was normal. Recommendation:           - Patient has a contact number available for  emergencies. The signs and symptoms of potential                            delayed complications were discussed with the                            patient. Return to normal activities tomorrow.                            Written discharge instructions were provided to the                            patient.                           - Resume previous diet.                           - Continue present medications.                           - Await pathology results.                           - Repeat colonoscopy date to be determined after                            pending pathology results are reviewed for                            surveillance based on pathology results.                           - Return to GI clinic at the next available                            appointment. Mauri Pole, MD 11/24/2018 10:13:14 AM This report has been signed electronically.

## 2018-11-24 NOTE — Progress Notes (Signed)
Pt. Reports no change in her medical or surgical history since her pre-visit 11/22/2018.

## 2018-11-24 NOTE — Patient Instructions (Signed)
YOU HAD AN ENDOSCOPIC PROCEDURE TODAY AT [facility]:   Refer to the procedure report that was given to you for any specific questions about what was found during the examination.  If the procedure report does not answer your questions, please call your gastroenterologist to clarify.  If you requested that your care partner not be given the details of your procedure findings, then the procedure report has been included in a sealed envelope for you to review at your convenience later.  YOU SHOULD EXPECT: Some feelings of bloating in the abdomen. Passage of more gas than usual.  Walking can help get rid of the air that was put into your GI tract during the procedure and reduce the bloating. If you had a lower endoscopy (such as a colonoscopy or flexible sigmoidoscopy) you may notice spotting of blood in your stool or on the toilet paper. If you underwent a bowel prep for your procedure, you may not have a normal bowel movement for a few days.  Please Note:  You might notice some irritation and congestion in your nose or some drainage.  This is from the oxygen used during your procedure.  There is no need for concern and it should clear up in a day or so.  SYMPTOMS TO REPORT IMMEDIATELY:   Following lower endoscopy (colonoscopy or flexible sigmoidoscopy):  Excessive amounts of blood in the stool  Significant tenderness or worsening of abdominal pains  Swelling of the abdomen that is new, acute  Fever of 100F or higher   Following upper endoscopy (EGD)  Vomiting of blood or coffee ground material  New chest pain or pain under the shoulder blades  Painful or persistently difficult swallowing  New shortness of breath  Fever of 100F or higher  Black, tarry-looking stools  For urgent or emergent issues, a gastroenterologist can be reached at any hour by calling [phone].   DIET:  We do recommend a small meal at first, but then you may proceed to your regular diet.  Drink  plenty of fluids but you should avoid alcoholic beverages for 24 hours.  MEDICATIONS: Continue present medications.  Please see handouts given to you by your recovery nurse.  FOLLOW UP: Schedule abdominal ultrasound with dopplers to exclude portal hypertension. Follow up with N[place] in her office at next available appointment. Dr. Woodward Ku nurse will call you to set up these appointments.  ACTIVITY:  You should plan to take it easy for the rest of today and you should NOT DRIVE or use heavy machinery until tomorrow (because of the sedation medicines used during the test).    FOLLOW UP: Our staff will call the number listed on your records the next business day following your procedure to check on you and address any questions or concerns that you may have regarding the information given to you following your procedure. If we do not reach you, we will leave a message.  However, if you are feeling well and you are not experiencing any problems, there is no need to return our call.  We will assume that you have returned to your regular daily activities without incident.  If any biopsies were taken you will be contacted by phone or by letter within the next 1-3 weeks.  Please call us at [phone] if you have not heard about the biopsies in 3 weeks.   Thank you for allowing [place] to provide for your healthcare needs today.   SIGNATURES/CONFIDENTIALITY: You and/or your care partner  have signed paperwork which will be entered into your electronic medical record.  These signatures attest to the fact that that the information above on your After Visit Summary has been reviewed and is understood.  Full responsibility of the confidentiality of this discharge information lies with you and/or your care-partner.

## 2018-11-24 NOTE — Progress Notes (Signed)
To PACU, VSS. Report to [email or web address] 

## 2018-11-24 NOTE — Op Note (Signed)
[facility] Patient Name: Brenda Hudson Procedure Date: 11/24/2018 9:24 AM MRN: [id number] Endoscopist: Mauri Pole , MD Age: 68 Referring MD:  Date of Birth: 1951/07/28 Gender: Female Account #: [id number] Procedure:                Upper GI endoscopy Indications:              Dysphagia Medicines:                Monitored Anesthesia Care Procedure:                Pre-Anesthesia Assessment:                           - Prior to the procedure, a History and Physical                            was performed, and patient medications and                            allergies were reviewed. The patient's tolerance of                            previous anesthesia was also reviewed. The risks                            and benefits of the procedure and the sedation                            options and risks were discussed with the patient.                            All questions were answered, and informed consent                            was obtained. Prior Anticoagulants: The patient has                            taken no previous anticoagulant or antiplatelet                            agents. ASA Grade Assessment: II - A patient with                            mild systemic disease. After reviewing the risks                            and benefits, the patient was deemed in                            satisfactory condition to undergo the procedure.                           After obtaining informed consent, the endoscope was  passed under direct vision. Throughout the                            procedure, the patient's blood pressure, pulse, and                            oxygen saturations were monitored continuously. The                            Endoscope was introduced through the mouth, and                            advanced to the second part of duodenum. The upper                            GI endoscopy was accomplished without  difficulty.                            The patient tolerated the procedure well. Scope In: Scope Out: Findings:                 The Z-line was regular and was found 37 cm from the                            incisors.                           Small (< 5 mm) single column of varix was found at                            the gastroesophageal junction. It was less than 5                            mm in largest diameter.                           No other endoscopic abnormality was evident in the                            esophagus to explain the patient's complaint of                            dysphagia.                           The stomach was normal.                           The first portion of the duodenum and second                            portion of the duodenum were normal. Biopsies for                            histology were taken with a  cold forceps for                            evaluation of celiac disease. Complications:            No immediate complications. Estimated Blood Loss:     Estimated blood loss was minimal. Impression:               - Z-line regular, 37 cm from the incisors.                           - Small (< 5 mm) esophageal varices.                           - No endoscopic esophageal abnormality to explain                            patient's dysphagia.                           - Normal stomach.                           - Normal first portion of the duodenum and second                            portion of the duodenum. Biopsied. Recommendation:           - Patient has a contact number available for                            emergencies. The signs and symptoms of potential                            delayed complications were discussed with the                            patient. Return to normal activities tomorrow.                            Written discharge instructions were provided to the                            patient.                            - Resume previous diet.                           - Continue present medications.                           - Await pathology results.                           - Schedule abdominal ultrasound with dopplers to  exclude portal hypertension                           - See the other procedure note for documentation of                            additional recommendations. Mauri Pole, MD 11/24/2018 10:08:06 AM This report has been signed electronically.

## 2018-11-27 ENCOUNTER — Telehealth: Payer: Self-pay | Admitting: *Deleted

## 2018-11-27 NOTE — Telephone Encounter (Signed)
  Follow up Call-  Call back number 11/24/2018  Post procedure Call Back phone  # [phone]  Permission to leave phone message Yes  Some recent data might be hidden     Patient questions:  Do you have a fever, pain , or abdominal swelling? No. Pain Score  0 *  Have you tolerated food without any problems? Yes.    Have you been able to return to your normal activities? Yes.    Do you have any questions about your discharge instructions: Diet   No. Medications  No. Follow up visit  No.  Do you have questions or concerns about your Care? No.  Actions: * If pain score is 4 or above: No action needed, pain <4.

## 2018-11-29 DIAGNOSIS — M48062 Spinal stenosis, lumbar region with neurogenic claudication: Secondary | ICD-10-CM | POA: Diagnosis not present

## 2018-11-29 DIAGNOSIS — M545 Low back pain: Secondary | ICD-10-CM | POA: Diagnosis not present

## 2018-11-29 DIAGNOSIS — M4316 Spondylolisthesis, lumbar region: Secondary | ICD-10-CM | POA: Diagnosis not present

## 2018-11-29 DIAGNOSIS — M5416 Radiculopathy, lumbar region: Secondary | ICD-10-CM | POA: Diagnosis not present

## 2018-12-05 ENCOUNTER — Other Ambulatory Visit: Payer: Self-pay

## 2018-12-05 MED ORDER — CIPROFLOXACIN HCL 500 MG PO TABS: 500.0000 mg | ORAL_TABLET | Freq: Two times a day (BID) | ORAL | 0 refills | Status: DC

## 2018-12-05 MED ORDER — METRONIDAZOLE 500 MG PO TABS: 500.0000 mg | ORAL_TABLET | Freq: Two times a day (BID) | ORAL | 0 refills | Status: DC

## 2018-12-06 ENCOUNTER — Other Ambulatory Visit: Payer: Self-pay

## 2018-12-06 ENCOUNTER — Telehealth: Payer: Self-pay | Admitting: Physician Assistant

## 2018-12-06 ENCOUNTER — Telehealth: Payer: Self-pay | Admitting: Gastroenterology

## 2018-12-06 NOTE — Telephone Encounter (Signed)
Spoke with Beth LPN at Dr. Woodward Ku office, and endorsed to her that per our Pharmacist Tana Coast, Cipro can prolong QT, so she would recommend that checking an EKG in 2-3 days, after starting therapy.  Per [facility] LPN, she will call the pt and endorse this information, and see if the pt even agrees to starting new antibiotic regimen.  Per [facility] LPN, she will staff message myself or send me a telephone encounter back, to make [place] aware if the pt agrees to start ABT treatment, and to help expedite arranging for her to have a EKG nurse visit done, 2-3 days after starting therapy.  Endorsed to [place] that I will route this encounter back to her, for reference and return feedback.  Brenda Hudson verbalized understanding and agreed with this plan.

## 2018-12-06 NOTE — Telephone Encounter (Signed)
Cindy from pt pharm called in and stated that the medication ciprofloxacin (CIPRO) 500 MG tablet [[id number]]  That was called in for the pt is a level 2 interaction with another one of her prescriptions and would like to know if the doctor still wants to prescribe.

## 2018-12-06 NOTE — Telephone Encounter (Signed)
Okay, thank you!

## 2018-12-06 NOTE — Telephone Encounter (Signed)
We can siwtch to Augmentin 875mg  BID instead of Cipro and flagyl. Thanks

## 2018-12-06 NOTE — Telephone Encounter (Signed)
Left message about this on her voicemail. I will call her tomorrow to discuss this after the cardiologist responds.

## 2018-12-06 NOTE — Telephone Encounter (Signed)
Left message with Cardiology. Requested recommendation on taking the Cipro and Flagyl while on Flicainide. Per pharmacy "level 2 interaction" prolonged QT.

## 2018-12-06 NOTE — Telephone Encounter (Signed)
Called and spoke to [facility] Dr. Woodward Ku nurse at [phone]. She states that they would need the patient to be on Cipro and Flagyl both for 7 days. There is an interaction with the patient's flecainide for the potential of QT prolongation. Made Beth aware that the information would be forwarded to Dr. Meda Coffee and our PharmD for review and recommendation.

## 2018-12-06 NOTE — Telephone Encounter (Signed)
Cipro can prolong Qt would recommend checking EKG 2-3 days after starting therapy.

## 2018-12-06 NOTE — Telephone Encounter (Signed)
Does need the abd u/s with dopplers but not urgently.

## 2018-12-06 NOTE — Telephone Encounter (Signed)
Dr Nandigam Please advise  

## 2018-12-06 NOTE — Telephone Encounter (Signed)
° ° ° ° °  1. Name of Medication: Cipro, Flagyl  2. How are you currently taking this medication (dosage and times per day)? n/a  3. Are you having a reaction (difficulty breathing--STAT)? no  4. What is your medication issue? Would you advise if patient can take Cipro/ Fagyl for 7 days, level 2 interaction? Please call [phone] Dr Woodward Ku nurse

## 2018-12-06 NOTE — Telephone Encounter (Signed)
Penicillin allergy. Cannot use Augmentin.

## 2018-12-07 NOTE — Telephone Encounter (Signed)
Left message for the patient requesting she call back to discuss.

## 2018-12-08 ENCOUNTER — Other Ambulatory Visit: Payer: Self-pay

## 2018-12-08 MED ORDER — AMOXICILLIN-POT CLAVULANATE 875-125 MG PO TABS: 1.0000 | ORAL_TABLET | Freq: Two times a day (BID) | ORAL | 0 refills | Status: AC

## 2018-12-08 NOTE — Telephone Encounter (Signed)
Spoke with Beth LPN at Dr. Woodward Ku office, and she endorsed that the pt will now take Augmentin for her issue, and there will be no need for a follow-up EKG.  Beth LPN gracious for all the assistance provided.

## 2018-12-08 NOTE — Telephone Encounter (Signed)
Spoke with the patient. She is unwilling to take Cipro. States she has taken Augmentin without problems in the last few months. She had a sinus infection. She denies rash with the Augmentin. Patient requests to take Augmentin. Please advise.

## 2018-12-08 NOTE — Telephone Encounter (Signed)
Ok we can send Rx for Augmentin 875mg  BID for 7 days. Thanks

## 2018-12-08 NOTE — Telephone Encounter (Signed)
Patient will be on Augmentin. Thank you for your help with this.

## 2018-12-18 ENCOUNTER — Ambulatory Visit (INDEPENDENT_AMBULATORY_CARE_PROVIDER_SITE_OTHER): Payer: Medicare Other | Admitting: Psychology

## 2018-12-18 DIAGNOSIS — F4321 Adjustment disorder with depressed mood: Secondary | ICD-10-CM | POA: Diagnosis not present

## 2018-12-22 ENCOUNTER — Telehealth: Payer: Self-pay

## 2018-12-22 ENCOUNTER — Other Ambulatory Visit: Payer: Self-pay | Admitting: Family Medicine

## 2018-12-22 DIAGNOSIS — I8500 Esophageal varices without bleeding: Secondary | ICD-10-CM

## 2018-12-22 NOTE — Telephone Encounter (Signed)
Needs to have her abdominal ultrasound with dopplers to exclude portal hypertension scheduled.  Left a message to call back to discuss having this scheduled. Patient was unable to schedule due to a family issue.

## 2018-12-28 DIAGNOSIS — M47816 Spondylosis without myelopathy or radiculopathy, lumbar region: Secondary | ICD-10-CM | POA: Diagnosis not present

## 2018-12-28 DIAGNOSIS — M542 Cervicalgia: Secondary | ICD-10-CM | POA: Diagnosis not present

## 2018-12-28 DIAGNOSIS — M5416 Radiculopathy, lumbar region: Secondary | ICD-10-CM | POA: Diagnosis not present

## 2018-12-28 DIAGNOSIS — M50021 Cervical disc disorder at C4-C5 level with myelopathy: Secondary | ICD-10-CM | POA: Diagnosis not present

## 2018-12-28 DIAGNOSIS — M9923 Subluxation stenosis of neural canal of lumbar region: Secondary | ICD-10-CM | POA: Diagnosis not present

## 2018-12-28 NOTE — Telephone Encounter (Signed)
Left message to call back and ask for me to schedule her u/s

## 2019-01-01 ENCOUNTER — Other Ambulatory Visit: Payer: Self-pay

## 2019-01-01 NOTE — Telephone Encounter (Signed)
Scheduled 01/10/19 at [facility]. Arrive at 11:15 for an 11:30 appointment. 8 hour fast.

## 2019-01-02 ENCOUNTER — Other Ambulatory Visit: Payer: Self-pay

## 2019-01-02 ENCOUNTER — Ambulatory Visit: Payer: Self-pay | Admitting: Gastroenterology

## 2019-01-02 DIAGNOSIS — I8500 Esophageal varices without bleeding: Secondary | ICD-10-CM

## 2019-01-08 ENCOUNTER — Ambulatory Visit (INDEPENDENT_AMBULATORY_CARE_PROVIDER_SITE_OTHER): Payer: Medicare Other | Admitting: Psychology

## 2019-01-08 DIAGNOSIS — F4321 Adjustment disorder with depressed mood: Secondary | ICD-10-CM | POA: Diagnosis not present

## 2019-01-10 ENCOUNTER — Ambulatory Visit (HOSPITAL_COMMUNITY)
Admission: RE | Admit: 2019-01-10 | Discharge: 2019-01-10 | Disposition: A | Discharge status: Home / Self Care | Payer: Medicare Other | Source: Ambulatory Visit | Attending: Gastroenterology | Admitting: Gastroenterology

## 2019-01-10 DIAGNOSIS — I8500 Esophageal varices without bleeding: Secondary | ICD-10-CM | POA: Insufficient documentation

## 2019-01-22 ENCOUNTER — Ambulatory Visit: Payer: Medicare Other | Admitting: Psychology

## 2019-01-22 ENCOUNTER — Ambulatory Visit: Payer: Self-pay | Admitting: Gastroenterology

## 2019-01-29 ENCOUNTER — Ambulatory Visit: Payer: Medicare Other | Admitting: Psychology

## 2019-02-08 ENCOUNTER — Other Ambulatory Visit: Payer: Self-pay | Admitting: Cardiology

## 2019-02-08 DIAGNOSIS — I341 Nonrheumatic mitral (valve) prolapse: Secondary | ICD-10-CM

## 2019-02-08 DIAGNOSIS — R002 Palpitations: Secondary | ICD-10-CM

## 2019-02-14 ENCOUNTER — Ambulatory Visit (INDEPENDENT_AMBULATORY_CARE_PROVIDER_SITE_OTHER): Payer: Medicare Other | Admitting: Psychology

## 2019-02-14 DIAGNOSIS — F4321 Adjustment disorder with depressed mood: Secondary | ICD-10-CM | POA: Diagnosis not present

## 2019-02-19 ENCOUNTER — Ambulatory Visit: Payer: Medicare Other | Admitting: Psychology

## 2019-03-12 ENCOUNTER — Ambulatory Visit (INDEPENDENT_AMBULATORY_CARE_PROVIDER_SITE_OTHER): Payer: Medicare Other | Admitting: Psychology

## 2019-03-12 DIAGNOSIS — F4321 Adjustment disorder with depressed mood: Secondary | ICD-10-CM

## 2019-03-22 ENCOUNTER — Encounter: Payer: Self-pay | Admitting: Family Medicine

## 2019-03-22 NOTE — Patient Instructions (Addendum)
Health Maintenance Due  Topic Date Due  . MAMMOGRAM Please call [facility] to have them send [place] a copy of your last mammogram pap smear and dexa scan  08/16/2015  . PAP SMEAR- see above 08/15/2016  . DEXA SCAN - see above  09/03/2018  . PNA vac Low Risk Adult (1 of 2 - PCV13) Postponed until Covid-19 09/03/2018    Depression screen PHQ 2/9 03/23/2019  Decreased Interest 0  Down, Depressed, Hopeless 0  PHQ - 2 Score 0   Video visit

## 2019-03-23 ENCOUNTER — Encounter: Payer: Self-pay | Admitting: Family Medicine

## 2019-03-23 ENCOUNTER — Ambulatory Visit (INDEPENDENT_AMBULATORY_CARE_PROVIDER_SITE_OTHER): Payer: Medicare Other | Admitting: Family Medicine

## 2019-03-23 VITALS — Temp 97.0°F | Ht 63.5 in | Wt 106.0 lb

## 2019-03-23 DIAGNOSIS — J329 Chronic sinusitis, unspecified: Secondary | ICD-10-CM

## 2019-03-23 DIAGNOSIS — B9689 Other specified bacterial agents as the cause of diseases classified elsewhere: Secondary | ICD-10-CM

## 2019-03-23 DIAGNOSIS — I491 Atrial premature depolarization: Secondary | ICD-10-CM

## 2019-03-23 MED ORDER — AMOXICILLIN-POT CLAVULANATE 875-125 MG PO TABS: 1.0000 | ORAL_TABLET | Freq: Two times a day (BID) | ORAL | 0 refills | Status: AC

## 2019-03-23 NOTE — Progress Notes (Signed)
Phone [phone]   Subjective:  Virtual visit via Video note. Chief complaint: Chief Complaint  Patient presents with  . Sinus Problem    Sx started 2 weeks ago. Pt taking mucinex denies relief. Pt claims to have sinus pain.     This visit type was conducted due to national recommendations for restrictions regarding the COVID-19 Pandemic (e.g. social distancing).  This format is felt to be most appropriate for this patient at this time balancing risks to patient and risks to population by having him in for in person visit.  No physical exam was performed (except for noted visual exam or audio findings with Telehealth visits).    Our team/I connected with Brenda Hudson on 03/23/19 at  8:00 AM EDT by a video enabled telemedicine application ([email or web address]) and verified that I am speaking with the correct person using two identifiers.  Location patient: Home[place] Location provider: [facility], office Persons participating in the virtual visit:  patient  Our team/I discussed the limitations of evaluation and management by telemedicine and the availability of in person appointments. In light of current covid-19 pandemic, patient also understands that we are trying to protect them by minimizing in office contact if at all possible.  The patient expressed consent for telemedicine visit and agreed to proceed. Patient understands insurance will be billed.   Brenda Hudson is a 68 y.o. year old very pleasant female patient who presents with sinusitis symptoms including nasal congestion, sinus tenderness. She states has been a tough spring for her allergies- at least 4 weeks of drainage and sinus pressure. Pain particularly in frontal sinuses. In the mornings does have sore throats then goes away but pressure does not go away.  -Symptoms are constant, worse than when illness first started -previous treatments: mucinex with tylenol, dayquil- for 3 weeks -sick contacts/travel/risks: denies flu  exposure.  -Hx of: allergies  ROS-denies fever, SOB, nausea, vomiting, diarrhea. No chest pain. No cough with this  Past Medical History-  Patient Active Problem List   Diagnosis Date Noted  . PAC (premature atrial contraction) 09/10/2014    Priority: High  . PVC's (premature ventricular contractions) 04/04/2018    Priority: Low  . Low back pain 09/30/2014    Priority: Low  . IBS (irritable bowel syndrome) 09/30/2014    Priority: Low  . Mitral valve prolapse 09/30/2014    Priority: Low  . Palpitations 09/10/2014    Priority: Low  . History of polymyalgia rheumatica 03/21/2008    Priority: Low    Medications- reviewed and updated Current Outpatient Medications  Medication Sig Dispense Refill  . acetaminophen (TYLENOL) 500 MG tablet Take 500 mg by mouth every 6 (six) hours as needed.    . flecainide (TAMBOCOR) 100 MG tablet Take 1 tablet (100 mg total) by mouth 2 (two) times daily. 180 tablet 3  . metoprolol succinate (TOPROL-XL) 25 MG 24 hr tablet TAKE 1 TABLET BY MOUTH EVERY DAY 90 tablet 2  . amoxicillin-clavulanate (AUGMENTIN) 875-125 MG tablet Take 1 tablet by mouth 2 (two) times daily for 7 days. 14 tablet 0   No current facility-administered medications for this visit.      Objective:  Temp (!) 97 F (36.1 C) (Oral)   Ht 5' 3.5" (1.613 m)   Wt 106 lb (48.1 kg)   BMI 18.48 kg/m  self reported vitals Gen: NAD, resting comfortably Points to sinuses frontal as source of pain, no visible rhinorrhea Lungs: nonlabored, normal respiratory rate  Skin: appears dry, no obvious  rash    Assessment and Plan   Sinus infection/Sinusitis Bacterial based on: Symptoms >10 days, double sickening  Treatment: -symptomatic care with  Plain mucinex or mucinex- DM (if you want to have something to help with cough as well) -Antibiotic indicated: yes-Augmentin sent in-this was effective when used about 2 years ago.  We used prednisone as well at that time.  At present due to some  underlying allergies-recommended Allegra or Claritin at night for least 2 to 3 weeks  Finally, we reviewed reasons to return to care including if symptoms worsen or persist  (despite above treatments) or new concerns arise (particularly fever or shortness of breath)  For PVCs/PACs- looks like she is coming up due on cardiology visit.  We discussed scheduling a video visit potentially.  She is doing reasonably well right now  For health maintenance-[facility]-team encouraged patient to call  Meds ordered this encounter  Medications  . amoxicillin-clavulanate (AUGMENTIN) 875-125 MG tablet    Sig: Take 1 tablet by mouth 2 (two) times daily for 7 days.    Dispense:  14 tablet    Refill:  0   Garret Reddish, MD

## 2019-04-02 ENCOUNTER — Telehealth: Payer: Self-pay

## 2019-04-02 NOTE — Telephone Encounter (Signed)
[email or web address] regarding appointment with [facility] 5/27.

## 2019-04-03 ENCOUNTER — Telehealth: Payer: Self-pay

## 2019-04-03 NOTE — Telephone Encounter (Signed)
   TELEPHONE CALL NOTE  This patient has been deemed a candidate for follow-up tele-health visit to limit community exposure during the Covid-19 pandemic. I spoke with the patient via phone to discuss instructions.  A Virtual Office Visit appointment type has been scheduled for 5/27 with Lyda Jester, PA, with "VIDEO" or "TELEPHONE" in the appointment notes - patient prefers Video type.   Frederik Schmidt, RN 04/03/2019 9:40 AM   Patient consented to a Virtual Visit by Video on 5/27 with Lyda Jester, PA.

## 2019-04-10 ENCOUNTER — Telehealth: Payer: Self-pay | Admitting: Family Medicine

## 2019-04-10 DIAGNOSIS — M5136 Other intervertebral disc degeneration, lumbar region: Secondary | ICD-10-CM | POA: Diagnosis not present

## 2019-04-10 DIAGNOSIS — M48061 Spinal stenosis, lumbar region without neurogenic claudication: Secondary | ICD-10-CM | POA: Diagnosis not present

## 2019-04-10 DIAGNOSIS — M5126 Other intervertebral disc displacement, lumbar region: Secondary | ICD-10-CM | POA: Diagnosis not present

## 2019-04-10 DIAGNOSIS — M4316 Spondylolisthesis, lumbar region: Secondary | ICD-10-CM | POA: Diagnosis not present

## 2019-04-10 DIAGNOSIS — M47816 Spondylosis without myelopathy or radiculopathy, lumbar region: Secondary | ICD-10-CM | POA: Diagnosis not present

## 2019-04-10 NOTE — Telephone Encounter (Signed)
Copied from [place] [id number]. Topic: General - Inquiry >> Apr 10, 2019  4:32 PM Lionel December wrote: Reason for CRM: Patient was seen last week and given an antibiotic. She was told if he wasn't any better, she may be prescribed prednisone. She stated she is still having a lot of sinus pain in her face and ears. She also has a itchy nose. Would like to try Prednisone.

## 2019-04-11 ENCOUNTER — Encounter: Payer: Self-pay | Admitting: Cardiology

## 2019-04-11 ENCOUNTER — Telehealth (INDEPENDENT_AMBULATORY_CARE_PROVIDER_SITE_OTHER): Payer: Medicare Other | Admitting: Cardiology

## 2019-04-11 ENCOUNTER — Other Ambulatory Visit: Payer: Self-pay

## 2019-04-11 VITALS — HR 63 | Temp 96.9°F | Ht 63.5 in | Wt 104.5 lb

## 2019-04-11 DIAGNOSIS — Z79899 Other long term (current) drug therapy: Secondary | ICD-10-CM

## 2019-04-11 DIAGNOSIS — I491 Atrial premature depolarization: Secondary | ICD-10-CM

## 2019-04-11 DIAGNOSIS — I493 Ventricular premature depolarization: Secondary | ICD-10-CM

## 2019-04-11 DIAGNOSIS — I341 Nonrheumatic mitral (valve) prolapse: Secondary | ICD-10-CM

## 2019-04-11 DIAGNOSIS — R002 Palpitations: Secondary | ICD-10-CM

## 2019-04-11 MED ORDER — FLECAINIDE ACETATE 100 MG PO TABS: 100.0000 mg | ORAL_TABLET | Freq: Two times a day (BID) | ORAL | 3 refills | Status: DC

## 2019-04-11 MED ORDER — METOPROLOL SUCCINATE ER 25 MG PO TB24: 25.0000 mg | ORAL_TABLET | Freq: Every day | ORAL | 3 refills | Status: DC

## 2019-04-11 NOTE — Patient Instructions (Signed)
Medication Instructions:  None If you need a refill on your cardiac medications before your next appointment, please call your pharmacy.   Lab work:04/12/19  Anytime between 7:30-4 Flecainide Level CMP If you have labs (blood work) drawn today and your tests are completely normal, you will receive your results only by: [name] MyChart Message (if you have MyChart) OR . A paper copy in the mail If you have any lab test that is abnormal or we need to change your treatment, we will call you to review the results.  Testing/Procedures: None  Follow-Up: 1 Year with Dr Meda Coffee At [facility], you and your health needs are our priority.  As part of our continuing mission to provide you with exceptional heart care, we have created designated Provider Care Teams.  These Care Teams include your primary Cardiologist (physician) and Advanced Practice Providers (APPs -  Physician Assistants and Nurse Practitioners) who all work together to provide you with the care you need, when you need it. .   Any Other Special Instructions Will Be Listed Below (If Applicable).

## 2019-04-11 NOTE — Progress Notes (Signed)
Virtual Visit via Video Note   This visit type was conducted due to national recommendations for restrictions regarding the COVID-19 Pandemic (e.g. social distancing) in an effort to limit this patient's exposure and mitigate transmission in our community.  Due to her co-morbid illnesses, this patient is at least at moderate risk for complications without adequate follow up.  This format is felt to be most appropriate for this patient at this time.  All issues noted in this document were discussed and addressed.  A limited physical exam was performed with this format.  Please refer to the patient's chart for her consent to telehealth for [facility].   Date:  04/11/2019   ID:  Brenda Hudson, [name] 02-Jul-1951, MRN [id number]  Patient Location: Home Provider Location: Home  PCP:  [name], MD  Cardiologist:  Ena Dawley, MD  Electrophysiologist:  None   Evaluation Performed:  Follow-Up Visit  Chief Complaint: Annual follow-up for PACs and PVCs  History of Present Illness:    Brenda Hudson is a 68 y.o. female, followed by Dr. Meda Coffee, being evaluated today for her annual cardiac follow-up.  She has a history of palpitations and documented PVCs & in high burden PACs with a total of 2300 monomorphic PACs captured in 1 day, by monitor, in 2015.  She was referred to EP and subsequently started on flecainide.  After initiation of flecainide, she had a follow-up exercise stress test that was negative for ischemia.    In February 2018, she had an office visit with Dr. Meda Coffee and she was hypotensive and bradycardiac.  Her Toprol XL was decreased and flecainide increased. Her hypotension and bradycardia resolved.  She also has a history of chest pain with coronary CTA in 2015 showing normal coronaries and calcium score 0.  Today in follow-up, she reports that she has done well since her last office visit.  She notes occasional palpitations but not severe nor prolonged.   Symptoms do not limit her abilities to function.  She is currently asymptomatic.  She also denies any chest pain or dyspnea.  No exertional symptoms with ADLs or exercise.  She reports full medication compliance.  Tolerating meds well without side effects.  She denies any symptoms of bradycardia or hypotension.  No fatigue, lightheadedness or dizziness.  She does not have a blood pressure cuff at home currently however was able to check pulse rate and reports rate at 63 bpm.  The patient does not have symptoms concerning for COVID-19 infection (fever, chills, cough, or new shortness of breath).  She has been avoiding large crowds.   Past Medical History:  Diagnosis Date  . Allergy   . Anxiety   . Arthritis   . Diverticulosis   . Frequent PVCs    and PAC per pt/has had along time  . GERD (gastroesophageal reflux disease)   . Heart murmur   . Hx of echocardiogram    a. Echo 12/13:  EF 55-60%, mild MR, normal diast fxn, no MVP  . IBS (irritable bowel syndrome)   . MVP (mitral valve prolapse)   . Osteoporosis   . Palpitations   . Polymyalgia rheumatica ([place])   . Rectal pain   . Spinal stenosis    Past Surgical History:  Procedure Laterality Date  . COLONOSCOPY    . DILATION AND CURETTAGE OF UTERUS    . TONSILLECTOMY    . TUBAL LIGATION       Current Meds  Medication Sig  . acetaminophen (  TYLENOL) 500 MG tablet Take 500 mg by mouth every 6 (six) hours as needed.  . flecainide (TAMBOCOR) 100 MG tablet Take 1 tablet (100 mg total) by mouth 2 (two) times daily.  [name] guaiFENesin (MUCINEX) 600 MG 12 hr tablet Take 600 mg by mouth 2 (two) times daily. As needed for allergies  . metoprolol succinate (TOPROL-XL) 25 MG 24 hr tablet Take 1 tablet (25 mg total) by mouth daily.  . [DISCONTINUED] flecainide (TAMBOCOR) 100 MG tablet Take 1 tablet (100 mg total) by mouth 2 (two) times daily.  . [DISCONTINUED] metoprolol succinate (TOPROL-XL) 25 MG 24 hr tablet TAKE 1 TABLET BY MOUTH EVERY DAY      Allergies:   Cefaclor; Penicillins; and Sulfonamide derivatives   Social History   Tobacco Use  . Smoking status: Never Smoker  . Smokeless tobacco: Never Used  Substance Use Topics  . Alcohol use: No  . Drug use: No     Family Hx: The patient's family history includes Colon polyps in her father; Diabetes in her father; Heart disease in her father; Kidney disease in her father; Sleep apnea in her mother. There is no history of Esophageal cancer, Rectal cancer, Stomach cancer, or Colon cancer.  ROS:   Please see the history of present illness.     All other systems reviewed and are negative.   Prior CV studies:   The following studies were reviewed today:  Coronary CT 2015  IMPRESSION: 1. Coronary calcium score of 0. This was 0 percentile for age and sex matched control.  2. Right dominance. Normal origin of coronary vessels. No evidence of CAD in the proximal vessels. The read of distal portions are affected by significant motion.   Labs/Other Tests and Data Reviewed:    EKG:  An ECG dated 04/05/18 was personally reviewed today and demonstrated:  sinus brady 51 bpm, 1st degree AVB, incomplete RBBB  Recent Labs: 07/07/2018: ALT 12; BUN 21; Creat 0.93; Hemoglobin 12.9; Platelets 241; Potassium 4.8; Sodium 141; TSH 0.94   Recent Lipid Panel Lab Results  Component Value Date/Time   CHOL 189 08/28/2013 08:22 AM   TRIG 38.0 08/28/2013 08:22 AM   HDL 78.30 08/28/2013 08:22 AM   CHOLHDL 2 08/28/2013 08:22 AM   LDLCALC 103 (H) 08/28/2013 08:22 AM   LDLDIRECT 149.9 01/20/2011 08:15 AM    Wt Readings from Last 3 Encounters:  04/11/19 104 lb 8 oz (47.4 kg)  03/23/19 106 lb (48.1 kg)  11/24/18 107 lb (48.5 kg)     Objective:    Vital Signs:  Pulse 63   Temp (!) 96.9 F (36.1 C) (Oral)   Ht 5' 3.5" (1.613 m)   Wt 104 lb 8 oz (47.4 kg)   BMI 18.22 kg/m    VITAL SIGNS:  reviewed GEN:  no acute distress EYES:  sclerae anicteric, EOMI - Extraocular Movements  Intact RESPIRATORY:  normal respiratory effort, symmetric expansion CARDIOVASCULAR:  no peripheral edema SKIN:  no rash, lesions or ulcers. MUSCULOSKELETAL:  no obvious deformities. NEURO:  alert and oriented x 3, no obvious focal deficit PSYCH:  normal affect  ASSESSMENT & PLAN:    1. PACs and PVCs: h/o high burden atrial ectopy, requiring AAD therapy w/ Flecainide +  blocker therapy w/ Metoprolol.  Well controlled with medications.  She reports occasional breakthrough symptoms (palpitations) that are mild and not prolonged.  Currently asymptomatic.  Fully compliant with flecainide and metoprolol without any side effects.  Pulse rate controlled at 63 bpm.  For  medication monitoring purposes, we will check a comprehensive metabolic panel as well as a flecainide level.  Follow-up with Dr. Meda Coffee in 1 year.  COVID-19 Education: The signs and symptoms of COVID-19 were discussed with the patient and how to seek care for testing (follow up with PCP or arrange E-visit).  The importance of social distancing was discussed today.  Time:   Today, I have spent 10 minutes with the patient with telehealth technology discussing the above problems.     Medication Adjustments/Labs and Tests Ordered: Current medicines are reviewed at length with the patient today.  Concerns regarding medicines are outlined above.   Tests Ordered: Orders Placed This Encounter  Procedures  . Flecainide level  . Comp Met (CMET)    Medication Changes: Meds ordered this encounter  Medications  . flecainide (TAMBOCOR) 100 MG tablet    Sig: Take 1 tablet (100 mg total) by mouth 2 (two) times daily.    Dispense:  180 tablet    Refill:  3    Pt must keep upcoming appt for future refills. Thank you.  . metoprolol succinate (TOPROL-XL) 25 MG 24 hr tablet    Sig: Take 1 tablet (25 mg total) by mouth daily.    Dispense:  90 tablet    Refill:  3    Disposition:  Follow up In 1 year with Dr. Meda Coffee  Signed, Lyda Jester, PA-C  04/11/2019 9:47 AM    [place]

## 2019-04-12 ENCOUNTER — Other Ambulatory Visit: Payer: Medicare Other | Admitting: *Deleted

## 2019-04-12 ENCOUNTER — Other Ambulatory Visit: Payer: Self-pay

## 2019-04-12 DIAGNOSIS — M9923 Subluxation stenosis of neural canal of lumbar region: Secondary | ICD-10-CM | POA: Diagnosis not present

## 2019-04-12 DIAGNOSIS — I493 Ventricular premature depolarization: Secondary | ICD-10-CM | POA: Diagnosis not present

## 2019-04-12 DIAGNOSIS — I491 Atrial premature depolarization: Secondary | ICD-10-CM

## 2019-04-12 DIAGNOSIS — M50021 Cervical disc disorder at C4-C5 level with myelopathy: Secondary | ICD-10-CM | POA: Diagnosis not present

## 2019-04-12 DIAGNOSIS — R002 Palpitations: Secondary | ICD-10-CM

## 2019-04-12 DIAGNOSIS — I341 Nonrheumatic mitral (valve) prolapse: Secondary | ICD-10-CM | POA: Diagnosis not present

## 2019-04-12 DIAGNOSIS — Z79899 Other long term (current) drug therapy: Secondary | ICD-10-CM | POA: Diagnosis not present

## 2019-04-12 DIAGNOSIS — M5416 Radiculopathy, lumbar region: Secondary | ICD-10-CM | POA: Diagnosis not present

## 2019-04-13 ENCOUNTER — Telehealth: Payer: Self-pay | Admitting: Family Medicine

## 2019-04-13 MED ORDER — DOXYCYCLINE HYCLATE 100 MG PO TABS: 100.0000 mg | ORAL_TABLET | Freq: Two times a day (BID) | ORAL | 0 refills | Status: DC

## 2019-04-13 NOTE — Telephone Encounter (Signed)
Message routed to PCP.

## 2019-04-13 NOTE — Telephone Encounter (Signed)
Pt stated that she had no relief on the Augmentin. Pt denies drainage and stated that the only issue is she has "soooo much pressure build up" I advise pt to try OTC allergy medications pt stated that she has tried w/o relief. Pt was notified that Dr. Yong Channel is out of the office but if it get worse over the weekend to go to [facility]. Pt verbalized understanding.   Please advise

## 2019-04-13 NOTE — Telephone Encounter (Signed)
°  Copied from [place] [id number]. Topic: General - Inquiry >> Apr 12, 2019  4:41 PM Mathis Bud wrote: Reason for CRM: Patient is calling back to discuss medication for her sinus's.  Patient is having a lot of pressure in face and constant headache.   Patient call back # [phone]

## 2019-04-13 NOTE — Addendum Note (Signed)
Addended by: [name] on: 04/13/2019 05:07 PM   Modules accepted: Orders

## 2019-04-13 NOTE — Telephone Encounter (Signed)
It has been over 14 days. Pt last Ov 03/23/2019.  Ok to send in antibiotics?  Please advise

## 2019-04-13 NOTE — Telephone Encounter (Signed)
How much did she improve on the Augmentin previously?  25%,  50%, 75%?  Or did she has improved at all on it?  If she made improvement but it just did not fully go away-I would likely do a longer course but need more information

## 2019-04-16 NOTE — Telephone Encounter (Signed)
Patient notified of update. No further action needed at this time.

## 2019-04-16 NOTE — Telephone Encounter (Signed)
Pt called and informed to take doxycycline x 10 days and if symptoms not resolved at that point, she should call and schedule a visit with Dr. Yong Channel.  Pt verified understanding and thanked me for the call.

## 2019-04-17 LAB — COMPREHENSIVE METABOLIC PANEL
ALT: 12 IU/L (ref 0–32)
AST: 21 IU/L (ref 0–40)
Albumin/Globulin Ratio: 2.2 (ref 1.2–2.2)
Albumin: 4.4 g/dL (ref 3.8–4.8)
Alkaline Phosphatase: 32 IU/L — ABNORMAL LOW (ref 39–117)
BUN/Creatinine Ratio: 26 (ref 12–28)
BUN: 20 mg/dL (ref 8–27)
Bilirubin Total: 0.3 mg/dL (ref 0.0–1.2)
CO2: 26 mmol/L (ref 20–29)
Calcium: 9.6 mg/dL (ref 8.7–10.3)
Chloride: 99 mmol/L (ref 96–106)
Creatinine, Ser: 0.77 mg/dL (ref 0.57–1.00)
GFR calc Af Amer: 94 mL/min/{1.73_m2} (ref >59)
GFR calc non Af Amer: 81 mL/min/{1.73_m2} (ref >59)
Globulin, Total: 2 g/dL (ref 1.5–4.5)
Glucose: 114 mg/dL — ABNORMAL HIGH (ref 65–99)
Potassium: 4.3 mmol/L (ref 3.5–5.2)
Sodium: 138 mmol/L (ref 134–144)
Total Protein: 6.4 g/dL (ref 6.0–8.5)

## 2019-04-17 LAB — FLECAINIDE LEVEL: Flecainide: 0.63 ug/mL (ref 0.20–1.00)

## 2019-04-19 ENCOUNTER — Telehealth: Payer: Self-pay

## 2019-04-19 NOTE — Telephone Encounter (Signed)
-----   Message from Consuelo Pandy, [place] sent at 04/18/2019 10:34 AM EDT ----- Flecainide level is within normal range. Continue current dose. No changes.

## 2019-04-19 NOTE — Telephone Encounter (Signed)
Notes recorded by Frederik Schmidt, RN on 04/19/2019 at 8:24 AM EDT The patient has been notified of the result and verbalized understanding. All questions (if any) were answered. Frederik Schmidt, RN 04/19/2019 8:24 AM

## 2019-05-04 ENCOUNTER — Encounter: Payer: Self-pay | Admitting: Family Medicine

## 2019-05-04 ENCOUNTER — Ambulatory Visit (INDEPENDENT_AMBULATORY_CARE_PROVIDER_SITE_OTHER): Payer: Medicare Other | Admitting: Family Medicine

## 2019-05-04 VITALS — HR 60 | Temp 97.2°F | Ht 63.5 in | Wt 106.0 lb

## 2019-05-04 DIAGNOSIS — J3489 Other specified disorders of nose and nasal sinuses: Secondary | ICD-10-CM | POA: Diagnosis not present

## 2019-05-04 MED ORDER — PREDNISONE 20 MG PO TABS: ORAL_TABLET | ORAL | 0 refills | Status: DC

## 2019-05-04 NOTE — Patient Instructions (Addendum)
Health Maintenance Due  Topic Date Due  . MAMMO[facility] - [facility] with [facility] 08/16/2015  . PAP SMEAR-Modifier - [facility] 08/15/2016  . DEXA SCAN - [facility] 09/03/2018  . PNA vac Low Risk Adult (1 of 2 - PCV13) 09/03/2018    Depression screen PHQ 2/9 03/23/2019  Decreased Interest 0  Down, Depressed, Hopeless 0  PHQ - 2 Score 0

## 2019-05-04 NOTE — Progress Notes (Signed)
Phone [phone]   Subjective:  Virtual visit via Video note. Chief complaint: Chief Complaint  Patient presents with  . Acute Visit  . Facial Pain    This visit type was conducted due to national recommendations for restrictions regarding the COVID-19 Pandemic (e.g. social distancing).  This format is felt to be most appropriate for this patient at this time balancing risks to patient and risks to population by having him in for in person visit.  No physical exam was performed (except for noted visual exam or audio findings with Telehealth visits).    Our team/I connected with [name] at  3:40 PM EDT by a video enabled telemedicine application (doxy.me or caregility through epic) and verified that I am speaking with the correct person using two identifiers.  Location patient: [place] Location provider: [facility], office Persons participating in the virtual visit:  patient  Our team/I discussed the limitations of evaluation and management by telemedicine and the availability of in person appointments. In light of current covid-19 pandemic, patient also understands that we are trying to protect them by minimizing in office contact if at all possible.  The patient expressed consent for telemedicine visit and agreed to proceed. Patient understands insurance will be billed.   ROS- +no cough. Has nasal congestion. No fever or chills. Continued sinus pressure   Past Medical History-  Patient Active Problem List   Diagnosis Date Noted  . PAC (premature atrial contraction) 09/10/2014    Priority: High  . PVC's (premature ventricular contractions) 04/04/2018    Priority: Low  . Low back pain 09/30/2014    Priority: Low  . IBS (irritable bowel syndrome) 09/30/2014    Priority: Low  . Mitral valve prolapse 09/30/2014    Priority: Low  . Palpitations 09/10/2014    Priority: Low  . History of polymyalgia rheumatica 03/21/2008    Priority: Low    Medications- reviewed and  updated Current Outpatient Medications  Medication Sig Dispense Refill  . acetaminophen (TYLENOL) 500 MG tablet Take 500 mg by mouth every 6 (six) hours as needed.    . flecainide (TAMBOCOR) 100 MG tablet Take 1 tablet (100 mg total) by mouth 2 (two) times daily. 180 tablet 3  . guaiFENesin (MUCINEX) 600 MG 12 hr tablet Take 600 mg by mouth 2 (two) times daily. As needed for allergies    . metoprolol succinate (TOPROL-XL) 25 MG 24 hr tablet Take 1 tablet (25 mg total) by mouth daily. 90 tablet 3  . predniSONE (DELTASONE) 20 MG tablet Take 2 pills for 3 days, 1 pill for 4 days 10 tablet 0   No current facility-administered medications for this visit.      Objective:  Pulse 60   Temp (!) 97.2 F (36.2 C)   Ht 5' 3.5" (1.613 m)   Wt 106 lb (48.1 kg)   BMI 18.48 kg/m  self reported vitals Gen: NAD, resting comfortably Ports to frontal and maxillary sinuses as source of pain Lungs: nonlabored, normal respiratory rate  Skin: appears dry, no obvious rash     Assessment and Plan   # Sinus Pain S:Sx x 2 months. She has completed 2 rounds of antibiotics but sx have returned. Sx never resolved with antibiotics. She has pain and pressure in her sinus and right. Denies fever. C/o constant postnasal drip, sinus HA. Has tried Severe Sinus Mucinex with some relief. She has also tried Claritin with minimal relief.    Denies yellow or green discharge but continues to  have facial pressure and pain- gets headache that feels like its above her eyes and cheek bones and cheek and right ear. Probably the worst she has had in a long time. Wakes up with sore throat in morning but goes away int he day but then back the next morning. Has pretty constant drippy nose. Pollens have been bad this year.   States no better on augmentin or doxycycline courses (7 days each). Has been taking sinus max mucinex- seems to help the most. claritin worsened symptoms.   No cough with this. Clear discharge.  A/P:  68 year old female with continued maxillary and frontal sinus pain despite a course of Augmentin and doxycycline and reports no improvement on these antibiotics.  We previously had discussed prednisone but in earlier phases of COVID-19 pandemic we were more cautious about the use-she would really like to trial a course of prednisone at this time-this was ordered.  Offered also using Levaquin at this time or ENT referral-she declines both but we can rediscuss in 7 to 10 days or sooner if symptoms worsen-she should let us know in your case.  May have allergic or inflammatory element to current sinus infection so I think prednisone is reasonable.  Lab/Order associations:   ICD-10-CM   1. Sinus pain  J34.89     Meds ordered this encounter  Medications  . predniSONE (DELTASONE) 20 MG tablet    Sig: Take 2 pills for 3 days, 1 pill for 4 days    Dispense:  10 tablet    Refill:  0   Return precautions advised.  Garret Reddish, MD

## 2019-05-14 DIAGNOSIS — M50021 Cervical disc disorder at C4-C5 level with myelopathy: Secondary | ICD-10-CM | POA: Diagnosis not present

## 2019-05-14 DIAGNOSIS — M542 Cervicalgia: Secondary | ICD-10-CM | POA: Diagnosis not present

## 2019-05-15 ENCOUNTER — Telehealth: Payer: Self-pay | Admitting: Family Medicine

## 2019-05-15 DIAGNOSIS — J3489 Other specified disorders of nose and nasal sinuses: Secondary | ICD-10-CM

## 2019-05-15 MED ORDER — LEVOFLOXACIN 500 MG PO TABS: 500.0000 mg | ORAL_TABLET | Freq: Every day | ORAL | 0 refills | Status: DC

## 2019-05-15 NOTE — Telephone Encounter (Signed)
Called pt and advised.  

## 2019-05-15 NOTE — Telephone Encounter (Signed)
I sent in Levaquin for patient-if she has complete resolution by 7 days she may stop but otherwise take full 10-day course.  If this does not resolve symptoms we either need to refer to ENT or get a sinus CT scan.

## 2019-05-15 NOTE — Telephone Encounter (Signed)
Pt stated that she finished prednisone and is still having pain and pressure in her face. She would like to try the Levaquin discussed with Dr. Yong Channel. Please advise.  [id number] [address], [place] [phone] (Phone) [phone] (Fax)

## 2019-05-15 NOTE — Telephone Encounter (Signed)
Forwarding to Dr. Yong Channel to advise regarding rx for Levaquin.

## 2019-05-15 NOTE — Telephone Encounter (Signed)
See note

## 2019-05-22 ENCOUNTER — Encounter: Payer: Self-pay | Admitting: Family Medicine

## 2019-05-28 ENCOUNTER — Ambulatory Visit (INDEPENDENT_AMBULATORY_CARE_PROVIDER_SITE_OTHER): Payer: Medicare Other | Admitting: Psychology

## 2019-05-28 DIAGNOSIS — F4321 Adjustment disorder with depressed mood: Secondary | ICD-10-CM

## 2019-05-31 DIAGNOSIS — M5416 Radiculopathy, lumbar region: Secondary | ICD-10-CM | POA: Diagnosis not present

## 2019-05-31 DIAGNOSIS — M47812 Spondylosis without myelopathy or radiculopathy, cervical region: Secondary | ICD-10-CM | POA: Diagnosis not present

## 2019-05-31 DIAGNOSIS — M9923 Subluxation stenosis of neural canal of lumbar region: Secondary | ICD-10-CM | POA: Diagnosis not present

## 2019-06-04 NOTE — Telephone Encounter (Signed)
Spoke to pt and she stated she will either do the sinus CT or ENT it doesn't matter. Pt stated if she does go to an ENT she prefer to go to Dr. Melissa Montane   Would like me to do the sinus CT or ENT?   Please advise

## 2019-06-04 NOTE — Telephone Encounter (Signed)
Refer to Dr.  Janace Hoard of [facility] ear nose and throat please

## 2019-06-04 NOTE — Telephone Encounter (Signed)
Pt is still having problems with her sinus and want to know what is her next step. She is till having drainage/sorethroat/pressure in head and hurts to bend over.

## 2019-06-04 NOTE — Telephone Encounter (Signed)
See note, unsure if another appointment was needed

## 2019-06-05 NOTE — Addendum Note (Signed)
Addended by: Jasper Loser on: 06/05/2019 08:59 AM   Modules accepted: Orders

## 2019-06-05 NOTE — Telephone Encounter (Signed)
Referral has been placed. Need to contact pt and advise.

## 2019-06-06 ENCOUNTER — Encounter: Payer: Self-pay | Admitting: Family Medicine

## 2019-06-06 NOTE — Telephone Encounter (Signed)
Called pt and advised that referral had been placed. She will contact the office if she has not been scheduled within the next 3 weeks.

## 2019-06-20 DIAGNOSIS — H6122 Impacted cerumen, left ear: Secondary | ICD-10-CM | POA: Insufficient documentation

## 2019-06-20 DIAGNOSIS — R0981 Nasal congestion: Secondary | ICD-10-CM | POA: Diagnosis not present

## 2019-06-20 DIAGNOSIS — J341 Cyst and mucocele of nose and nasal sinus: Secondary | ICD-10-CM | POA: Diagnosis not present

## 2019-06-20 DIAGNOSIS — R0982 Postnasal drip: Secondary | ICD-10-CM | POA: Diagnosis not present

## 2019-06-20 DIAGNOSIS — J329 Chronic sinusitis, unspecified: Secondary | ICD-10-CM | POA: Diagnosis not present

## 2019-06-25 DIAGNOSIS — M5416 Radiculopathy, lumbar region: Secondary | ICD-10-CM | POA: Diagnosis not present

## 2019-06-26 ENCOUNTER — Encounter: Payer: Self-pay | Admitting: Gastroenterology

## 2019-07-03 DIAGNOSIS — Z20828 Contact with and (suspected) exposure to other viral communicable diseases: Secondary | ICD-10-CM | POA: Diagnosis not present

## 2019-07-19 ENCOUNTER — Ambulatory Visit: Payer: Medicare Other | Admitting: Family Medicine

## 2019-08-06 DIAGNOSIS — M5416 Radiculopathy, lumbar region: Secondary | ICD-10-CM | POA: Diagnosis not present

## 2019-08-06 DIAGNOSIS — M9923 Subluxation stenosis of neural canal of lumbar region: Secondary | ICD-10-CM | POA: Diagnosis not present

## 2019-08-09 ENCOUNTER — Ambulatory Visit: Payer: Medicare Other | Admitting: Neurology

## 2019-08-20 NOTE — Patient Instructions (Addendum)
Health Maintenance Due  Topic Date Due  . PNA vac Low Risk Adult (1 of 2 - PCV13)-today 09/03/2018  . INFLUENZA VACCINE -today 06/16/2019   Start prednisone  And 3 days after start exercises 3 days a week  Randal Goens please give shoulder bursitis handout  If fails to improve or worsens please let us know- can get you in with Dr. Tamala Julian of sports medicine or you can come back here and we can try shoulder injection  Referral to Dr. Rita Ohara today- let us know if you have not heard within 1-2 weeks.

## 2019-08-20 NOTE — Progress Notes (Signed)
Phone [phone]   Subjective:  Brenda Hudson is a 68 y.o. year old very pleasant female patient who presents for/with See problem oriented charting Chief Complaint  Patient presents with   Follow-up   ENT follow up   ROS-no fever/chills/nausea/vomiting.  Does have numbness and tingling in the legs.  Does have low back pain and pain radiating into the legs  Past Medical History-  Patient Active Problem List   Diagnosis Date Noted   PAC (premature atrial contraction) 09/10/2014    Priority: High   Headache, unspecified 08/21/2019    Priority: Low   PVC's (premature ventricular contractions) 04/04/2018    Priority: Low   Low back pain 09/30/2014    Priority: Low   IBS (irritable bowel syndrome) 09/30/2014    Priority: Low   Mitral valve prolapse 09/30/2014    Priority: Low   Palpitations 09/10/2014    Priority: Low   History of polymyalgia rheumatica 03/21/2008    Priority: Low   Spinal stenosis of lumbar region with neurogenic claudication 08/21/2019    Medications- reviewed and updated Current Outpatient Medications  Medication Sig Dispense Refill   acetaminophen (TYLENOL) 500 MG tablet Take 500 mg by mouth every 6 (six) hours as needed.     flecainide (TAMBOCOR) 100 MG tablet Take 1 tablet (100 mg total) by mouth 2 (two) times daily. 180 tablet 3   guaiFENesin (MUCINEX) 600 MG 12 hr tablet Take 600 mg by mouth 2 (two) times daily. As needed for allergies     metoprolol succinate (TOPROL-XL) 25 MG 24 hr tablet Take 1 tablet (25 mg total) by mouth daily. 90 tablet 3   predniSONE (DELTASONE) 20 MG tablet Take 2 pills for 3 days, 1 pill for 3 days, 1/2 pill for 3 days, 1/2 pill every other day until finished 12 tablet 0   No current facility-administered medications for this visit.      Objective:  BP 110/70    Pulse 71    Temp (!) 97.4 F (36.3 C)    Ht 5' 3.5" (1.613 m)    Wt 111 lb 3.2 oz (50.4 kg)    SpO2 96%    BMI 19.39 kg/m  Gen: NAD,  resting comfortably CV: RRR no murmurs rubs or gallops Lungs: CTAB no crackles, wheeze, rhonchi Abdomen: soft/nontender/nondistended/normal bowel sounds.  Ext: no edema Skin: warm, dry    Assessment and Plan   Other notes: 1.  A lot of stressors- limited visits with her 58 year old mother who used to be my patient.  Daughter at home living with her and has epilepsy and 5 horses to look after.   Chronic headaches-located frontal sinus area S: Patient was seen in June 2020 for sinus pressure after being treated in May 2020 for sinusitis-treated with prednisone in June after she failed courses of both Augmentin and doxycycline.  We discussed possibly using Levaquin or ENT referral at that time-she wanted to give the prednisone some time before deciding.  Later treated with Levaquin without relief  she called back later that month and we referred to Dr. Janace Hoard of ENT with [facility].  He set her up with CT imaging which showed small bilateral maxillary sinus retention cysts but otherwise normal-no indication for further antibiotics-referred to go for neurology for chronic headaches  COVID-19 test done August 2020 and not detected at [facility].  Patient continues to have maxillary sinus pressure.  Headaches still in cheek bones about 1-2 annoyance out of 10 pain.  She opted  out of neurology visit. Maxillary sinuses.  A/P: Patient with extensive treatment for maxillary sinusitis which failed-ultimately had sinus CT which did not show sinusitis but symptoms have persisted-ENT recommended referral to neurology but patient has declined that at this point.  Discussed reasoning for referral again today but she declines-will let us know if has new or worsening symptoms -Could consider neuroimaging which patient declines for now -I do not think maxillary sinus pain is related to cervical spinal stenosis but would be open to neurosurgeries opinion on this  #Lumbar spinal stenosis #Cervical spine  stenosis S: Saw spine scoliosis [facility] for cervical disc disorder in late June as well-multilevel arthritis noted on MRI cervical spine as well as neuroforaminal stenosis on the left at C3-4 and on the right at C6-C7. Bothers her with turning head when driving.  Patient states Ablation considered for cervical spine stenosis and related pain.  She denies having injections in this area.  Pain in this area is reasonable/manageable at 4-5 out of 10.   Patient feels like her worst issues with her lumbar spine-Low back 6-8/10.  Pain also radiates into bilateral lower legs.  Numbness/tingling into her legs which seems to worsen with walking. Tried steroid injection without relief and does not want to tr again.  Imaging results below.  Has seen Spine and scoliosis center Dr. Patrice Paradise and they are recommending considering surgery.  Patient would like to get a second opinion  Has only tried tylenol and advil. Prescribed gabapentin but she is "lightweight".  Feels like she needs to be able to respond at night and is hesitant to take. Does not remember dose of gabpentin even at 100mg . Took prednisone in June and back felt better at least short-term.   From imaging 04/11/2019 "1. L4-5 severe facet arthropathy with progressive anterolisthesisand disc degeneration when compared to 2016. There is now high-grade spinal and right foraminal impingement. 2. L2-3 and L3-4 disc and facet degeneration. Left subarticular recess narrowing at L3-4 that could affect the L4 nerve root."  Also hand arthritis as another orthopedic issue- could try voltaren discussed today A/P: Patient's primary concern is the lumbar stenosis and potential need for surgery-she would like to talk to a neurosurgeon for second opinion.  I did encourage her to possibly try the gabapentin low-dose she was given 100 mg which she declined for now.  She does agree to prednisone for her shoulder which may also help her lumbar spine.  Due to ongoing  pain place referral is urgent but we discussed even with urgent referral may take some time to get in.  She can also discuss cervical spinal stenosis options with neurosurgery but feels like that is more manageable at present.   #Left shoulder pain S: Patient was shoveling in relation to caring for her horses a few months ago and felt some strain in her shoulders-recurrent issue as she is done this since that time.  Notes difficulty with abduction related to pain past 90 degrees.  Some pain with putting her arm behind her back.  Pain with trying to lift arm up can get up to at least a 5 or 6 out of 10-aching sensation.  Better with rest.  Tylenol has not been very effective.  No chest pain reported or shortness of breath. A/P: Patient with signs of impingement concerning for either bursitis versus rotator cuff tendinopathy-we will trial prednisone taper which may also help with lumbar stenosis.  Gave home exercise program to do 3 times a week for  at least a month. -No fall reported and will hold off on x-ray at this time - Could consider shoulder injection in the future   Recommended follow up: PRN for these acute issues if fails to improve  Lab/Order associations:   ICD-10-CM   1. Spinal stenosis of lumbar region with neurogenic claudication  M48.062 Ambulatory referral to Neurosurgery  2. Cervical spinal stenosis  M48.02   3. Chronic tension-type headache, intractable  G44.221   4. Need for immunization against influenza  Z23 Flu Vaccine QUAD High Dose(Fluad)  5. Need for pneumococcal vaccination  Z23 Pneumococcal conjugate vaccine 13-valent IM   Meds ordered this encounter  Medications   predniSONE (DELTASONE) 20 MG tablet    Sig: Take 2 pills for 3 days, 1 pill for 3 days, 1/2 pill for 3 days, 1/2 pill every other day until finished    Dispense:  12 tablet    Refill:  0    Return precautions advised.  Garret Reddish, MD

## 2019-08-21 ENCOUNTER — Encounter: Payer: Self-pay | Admitting: Family Medicine

## 2019-08-21 ENCOUNTER — Ambulatory Visit (INDEPENDENT_AMBULATORY_CARE_PROVIDER_SITE_OTHER): Payer: Medicare Other | Admitting: Family Medicine

## 2019-08-21 ENCOUNTER — Other Ambulatory Visit: Payer: Self-pay

## 2019-08-21 VITALS — BP 110/70 | HR 71 | Temp 97.4°F | Ht 63.5 in | Wt 111.2 lb

## 2019-08-21 DIAGNOSIS — G44221 Chronic tension-type headache, intractable: Secondary | ICD-10-CM

## 2019-08-21 DIAGNOSIS — Z23 Encounter for immunization: Secondary | ICD-10-CM | POA: Diagnosis not present

## 2019-08-21 DIAGNOSIS — M48062 Spinal stenosis, lumbar region with neurogenic claudication: Secondary | ICD-10-CM

## 2019-08-21 DIAGNOSIS — R519 Headache, unspecified: Secondary | ICD-10-CM | POA: Insufficient documentation

## 2019-08-21 DIAGNOSIS — M4802 Spinal stenosis, cervical region: Secondary | ICD-10-CM | POA: Diagnosis not present

## 2019-08-21 MED ORDER — PREDNISONE 20 MG PO TABS: ORAL_TABLET | ORAL | 0 refills | Status: DC

## 2019-08-21 NOTE — Assessment & Plan Note (Signed)
S: Patient was seen in June 2020 for sinus pressure after being treated in May 2020 for sinusitis-treated with prednisone in June after she failed courses of both Augmentin and doxycycline.  We discussed possibly using Levaquin or ENT referral at that time-she wanted to give the prednisone some time before deciding.  Later treated with Levaquin without relief  she called back later that month and we referred to Dr. Janace Hoard of ENT with [facility].  He set her up with CT imaging which showed small bilateral maxillary sinus retention cysts but otherwise normal-no indication for further antibiotics-referred to go for neurology for chronic headaches  COVID-19 test done August 2020 and not detected at [facility].  Patient continues to have maxillary sinus pressure.  Headaches still in cheek bones about 1-2 annoyance out of 10 pain.  She opted out of neurology visit. Maxillary sinuses.  A/P: Patient with extensive treatment for maxillary sinusitis which failed-ultimately had sinus CT which did not show sinusitis but symptoms have persisted-ENT recommended referral to neurology but patient has declined that at this point.  Discussed reasoning for referral again today but she declines-will let us know if has new or worsening symptoms -Could consider neuroimaging which patient declines for now

## 2019-09-04 DIAGNOSIS — M5416 Radiculopathy, lumbar region: Secondary | ICD-10-CM | POA: Diagnosis not present

## 2019-09-04 DIAGNOSIS — M4312 Spondylolisthesis, cervical region: Secondary | ICD-10-CM | POA: Diagnosis not present

## 2019-09-04 DIAGNOSIS — M48062 Spinal stenosis, lumbar region with neurogenic claudication: Secondary | ICD-10-CM | POA: Diagnosis not present

## 2019-09-04 DIAGNOSIS — M5030 Other cervical disc degeneration, unspecified cervical region: Secondary | ICD-10-CM | POA: Diagnosis not present

## 2019-09-04 DIAGNOSIS — M4316 Spondylolisthesis, lumbar region: Secondary | ICD-10-CM | POA: Diagnosis not present

## 2019-09-04 DIAGNOSIS — M542 Cervicalgia: Secondary | ICD-10-CM | POA: Diagnosis not present

## 2019-09-04 DIAGNOSIS — M4722 Other spondylosis with radiculopathy, cervical region: Secondary | ICD-10-CM | POA: Diagnosis not present

## 2019-09-04 DIAGNOSIS — M47816 Spondylosis without myelopathy or radiculopathy, lumbar region: Secondary | ICD-10-CM | POA: Diagnosis not present

## 2019-09-04 DIAGNOSIS — M5136 Other intervertebral disc degeneration, lumbar region: Secondary | ICD-10-CM | POA: Diagnosis not present

## 2019-09-04 DIAGNOSIS — M549 Dorsalgia, unspecified: Secondary | ICD-10-CM | POA: Diagnosis not present

## 2019-10-07 DIAGNOSIS — Z20828 Contact with and (suspected) exposure to other viral communicable diseases: Secondary | ICD-10-CM | POA: Diagnosis not present

## 2019-10-25 DIAGNOSIS — Z20828 Contact with and (suspected) exposure to other viral communicable diseases: Secondary | ICD-10-CM | POA: Diagnosis not present

## 2019-11-28 ENCOUNTER — Ambulatory Visit: Payer: Medicare Other | Admitting: Psychology

## 2019-12-18 ENCOUNTER — Ambulatory Visit (INDEPENDENT_AMBULATORY_CARE_PROVIDER_SITE_OTHER): Payer: Medicare Other | Admitting: Psychology

## 2019-12-18 DIAGNOSIS — F4321 Adjustment disorder with depressed mood: Secondary | ICD-10-CM

## 2019-12-21 ENCOUNTER — Other Ambulatory Visit: Payer: Self-pay

## 2019-12-21 ENCOUNTER — Ambulatory Visit (INDEPENDENT_AMBULATORY_CARE_PROVIDER_SITE_OTHER): Payer: Medicare Other

## 2019-12-21 VITALS — BP 108/62 | HR 52 | Temp 98.3°F | Ht 64.0 in | Wt 115.0 lb

## 2019-12-21 DIAGNOSIS — Z0000 Encounter for general adult medical examination without abnormal findings: Secondary | ICD-10-CM | POA: Diagnosis not present

## 2019-12-21 NOTE — Progress Notes (Signed)
Subjective:   Brenda Hudson is a 69 y.o. female who presents for an Initial Medicare Annual Wellness Visit.  Review of Systems     Cardiac Risk Factors include: advanced age (>31men, >19 women)    Objective:    Today's Vitals   12/21/19 1504  BP: 108/62  Pulse: (!) 52  Temp: 98.3 F (36.8 C)  TempSrc: Temporal  SpO2: 99%  Weight: 115 lb (52.2 kg)  Height: 5\' 4"  (1.626 m)   Body mass index is 19.74 kg/m.  Advanced Directives 12/21/2019 08/08/2014  Does Patient Have a Medical Advance Directive? Yes Yes  Type of Advance Directive Living will;Healthcare Power of [place]  Does patient want to make changes to medical advance directive? No - Patient declined -  Copy of [place] in Chart? No - copy requested -    Current Medications (verified) Outpatient Encounter Medications as of 12/21/2019  Medication Sig  . acetaminophen (TYLENOL) 500 MG tablet Take 500 mg by mouth every 6 (six) hours as needed.  . flecainide (TAMBOCOR) 100 MG tablet Take 1 tablet (100 mg total) by mouth 2 (two) times daily.  [name] guaiFENesin (MUCINEX) 600 MG 12 hr tablet Take 600 mg by mouth 2 (two) times daily. As needed for allergies  . metoprolol succinate (TOPROL-XL) 25 MG 24 hr tablet Take 1 tablet (25 mg total) by mouth daily.  . predniSONE (DELTASONE) 20 MG tablet Take 2 pills for 3 days, 1 pill for 3 days, 1/2 pill for 3 days, 1/2 pill every other day until finished   No facility-administered encounter medications on file as of 12/21/2019.    Allergies (verified) Cefaclor, Penicillins, and Sulfonamide derivatives   History: Past Medical History:  Diagnosis Date  . Allergy   . Anxiety   . Arthritis   . Diverticulosis   . Frequent PVCs    and PAC per pt/has had along time  . GERD (gastroesophageal reflux disease)   . Heart murmur   . Hx of echocardiogram    a. Echo 12/13:  EF 55-60%, mild MR, normal diast fxn, no MVP  . IBS (irritable  bowel syndrome)   . MVP (mitral valve prolapse)   . Osteoporosis   . Palpitations   . Polymyalgia rheumatica ([place])   . Rectal pain   . Spinal stenosis    Past Surgical History:  Procedure Laterality Date  . COLONOSCOPY    . DILATION AND CURETTAGE OF UTERUS    . TONSILLECTOMY    . TUBAL LIGATION     Family History  Problem Relation Age of Onset  . Sleep apnea Mother   . Diabetes Father        in 26s  . Kidney disease Father        DM related, renal failure  . Heart disease Father        CVA young age, Smoker, overweight  . Colon polyps Father   . Esophageal cancer Neg Hx   . Rectal cancer Neg Hx   . Stomach cancer Neg Hx   . Colon cancer Neg Hx    Social History   Socioeconomic History  . Marital status: Widowed    Spouse name: Not on file  . Number of children: 2  . Years of education: Not on file  . Highest education level: Not on file  Occupational History  . Occupation: Pharmacist, hospital    Comment: Special Education   Tobacco Use  . Smoking status: Never  Smoker  . Smokeless tobacco: Never Used  Substance and Sexual Activity  . Alcohol use: No  . Drug use: No  . Sexual activity: Not on file  Other Topics Concern  . Not on file  Social History Narrative   Widowed in 14-Feb-2013 (husband sudden death MI despite being in great shape). 2 daughters. Oldest daughter pregnant in 09/2014-Phd student at [employer] in early [place]. Younger daughter [name] grad student [employer] in [place].       [employer]. Both she and her husband. Husband went to [employer] for lawschool. 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 Difficulty of Paying Living Expenses: Not on file  Food Insecurity:   . Worried About Charity fundraiser in  the Last Year: Not on file  . Ran Out of Food in the Last Year: Not on file  Transportation Needs:   . Lack of Transportation (Medical): Not on file  . Lack of Transportation (Non-Medical): Not on file  Physical Activity:   . Days of Exercise per Week: Not on file  . Minutes of Exercise per Session: Not on file  Stress:   . Feeling of Stress : Not on file  Social Connections:   . Frequency of Communication with Friends and Family: Not on file  . Frequency of Social Gatherings with Friends and Family: Not on file  . Attends Religious Services: Not on file  . Active Member of Clubs or Organizations: Not on file  . Attends Archivist Meetings: Not on file  . Marital Status: Not on file    Tobacco Counseling Counseling given: Not Answered   Clinical Intake:  Pre-visit preparation completed: Yes  Diabetes: No  How often do you need to have someone help you when you read instructions, pamphlets, or other written materials from your doctor or pharmacy?: 1 - Never  Interpreter Needed?: No  Information entered by :: Denman George LPN   Activities of Daily Living In your present state of health, do you have any difficulty performing the following activities: 12/21/2019  Hearing? N  Vision? N  Difficulty concentrating or making decisions? N  Walking or climbing stairs? N  Dressing or bathing? N  Doing errands, shopping? N  Preparing Food and eating ? N  Using the Toilet? N  In the past six months, have you accidently leaked urine? N  Do you have problems with loss of bowel control? N  Managing your Medications? N  Managing your Finances? N  Housekeeping or managing your Housekeeping? N  Some recent data might be hidden     Immunizations and Health Maintenance Immunization History  Administered Date(s) Administered  . Fluad Quad(high Dose 65+) 08/21/2019  . Influenza Inj Mdck Quad With Preservative 11/15/2018  . Influenza Split 11/25/2011  .  Influenza,inj,Quad PF,6+ Mos 08/28/2013, 09/10/2014, 08/05/2016, 09/22/2017  . Pneumococcal Conjugate-13 08/21/2019  . Td 03/21/2008  . Tdap 03/21/2008, 04/25/2015  . Zoster 09/07/2013   There are no preventive care reminders to display for this patient.  Patient Care Team: [name], MD as PCP - General (Family Medicine) Dorothy Spark, MD as PCP - Cardiology (Cardiology) Jovita Gamma, MD as Consulting Physician (Neurosurgery) Jolene Provost, PA-C as Physician Assistant (  Otolaryngology) Dian Queen, MD as Consulting Physician (Obstetrics and Gynecology) Mauri Pole, MD as Consulting Physician (Gastroenterology)  Indicate any recent Medical Services you may have received from other than [facility] providers in the past year (date may be approximate).     Assessment:   This is a routine wellness examination for [place].  Hearing/Vision screen No exam data present  Dietary issues and exercise activities discussed: Current Exercise Habits: Home exercise routine, Time (Minutes): 60, Frequency (Times/Week): 5, Weekly Exercise (Minutes/Week): 300, Intensity: Moderate, Exercise limited by: Other - see comments(yardwork activities-caring for horses)  Goals   None    Depression Screen PHQ 2/9 Scores 12/21/2019 08/21/2019 03/23/2019  PHQ - 2 Score 0 0 0    Fall Risk Fall Risk  12/21/2019 05/04/2019  Falls in the past year? 0 0  Number falls in past yr: 0 -  Injury with Fall? 0 -  Follow up Education provided;Falls prevention discussed;Falls evaluation completed -    Is the patient's home free of loose throw rugs in walkways, pet beds, electrical cords, etc?   yes      Grab bars in the bathroom? yes      Handrails on the stairs?   yes      Adequate lighting?   yes  Timed Get Up and Go Performed completed and within normal timeframe; no gait abnormalities noted   Cognitive Function:     6CIT Screen 12/21/2019  What Year? 0 points  What month? 0 points   What time? 0 points  Count back from 20 0 points  Months in reverse 0 points  Repeat phrase 0 points  Total Score 0    Screening Tests Health Maintenance  Topic Date Due  . MAMMOGRAM  05/25/2020  . PNA vac Low Risk Adult (2 of 2 - PPSV23) 08/20/2020  . TETANUS/TDAP  04/24/2025  . COLONOSCOPY  11/24/2028  . INFLUENZA VACCINE  Completed  . DEXA SCAN  Completed  . Hepatitis C Screening  Completed    Qualifies for Shingles Vaccine? Discussed and patient will check with pharmacy for coverage.  Patient education handout provided   Cancer Screenings: Lung: Low Dose CT Chest recommended if Age 19-80 years, 30 pack-year currently smoking OR have quit w/in 15years. Patient does not qualify. Breast: Up to date on Mammogram? Yes   Up to date of Bone Density/Dexa? Yes Colorectal: colonoscopy 11/24/18  Plan:  I have personally reviewed and addressed the Medicare Annual Wellness questionnaire and have noted the following in the patient's chart:  A. Medical and social history B. Use of alcohol, tobacco or illicit drugs  C. Current medications and supplements D. Functional ability and status E.  Nutritional status F.  Physical activity G. Advance directives H. List of other physicians I.  Hospitalizations, surgeries, and ER visits in previous 12 months J.  [place] such as hearing and vision if needed, cognitive and depression L. Referrals, records requested, and appointments- will request last notes from gyn   In addition, I have reviewed and discussed with patient certain preventive protocols, quality metrics, and best practice recommendations. A written personalized care plan for preventive services as well as general preventive health recommendations were provided to patient.   Signed,  Denman George, LPN  Nurse Health Advisor   Nurse Notes: no additional

## 2019-12-21 NOTE — Patient Instructions (Addendum)
Brenda Hudson , Thank you for taking time to come for your Medicare Wellness Visit. I appreciate your ongoing commitment to your health goals. Please review the following plan we discussed and let me know if I can assist you in the future.   Screening recommendations/referrals: Colorectal Screening: up to date; last colonoscopy 11/24/18 Mammogram: up to date; last 05/25/18 Bone Density: up to date; last 08/09/18  Vision and Dental Exams: Recommended annual ophthalmology exams for early detection of glaucoma and other disorders of the eye Recommended annual dental exams for proper oral hygiene  Vaccinations: Influenza vaccine: completed 08/21/19 Pneumococcal vaccine: up to date; last 08/21/19 Tdap vaccine: up to date; last 04/25/15  Shingles vaccine: Please call your insurance company to determine your out of pocket expense for the Shingrix vaccine. You may receive this vaccine at your local pharmacy. (see attached handout)   Advanced directives: Please bring a copy of your POA (Power of Attorney) and/or Living Will to your next appointment.  Goals: Recommend to drink at least 6-8 8oz glasses of water per day and consume a balanced diet rich in fresh fruits and vegetables.   Next appointment: Please schedule your Annual Wellness Visit with your Nurse Health Advisor in one year.  Preventive Care 28 Years and Older, Female Preventive care refers to lifestyle choices and visits with your health care provider that can promote health and wellness. What does preventive care include?  A yearly physical exam. This is also called an annual well check.  Dental exams once or twice a year.  Routine eye exams. Ask your health care provider how often you should have your eyes checked.  Personal lifestyle choices, including:  Daily care of your teeth and gums.  Regular physical activity.  Eating a healthy diet.  Avoiding tobacco and drug use.  Limiting alcohol use.  Practicing safe  sex.  Taking low-dose aspirin every day if recommended by your health care provider.  Taking vitamin and mineral supplements as recommended by your health care provider. What happens during an annual well check? The services and screenings done by your health care provider during your annual well check will depend on your age, overall health, lifestyle risk factors, and family history of disease. Counseling  Your health care provider may ask you questions about your:  Alcohol use.  Tobacco use.  Drug use.  Emotional well-being.  Home and relationship well-being.  Sexual activity.  Eating habits.  History of falls.  Memory and ability to understand (cognition).  Work and work Statistician.  Reproductive health. Screening  You may have the following tests or measurements:  Height, weight, and BMI.  Blood pressure.  Lipid and cholesterol levels. These may be checked every 5 years, or more frequently if you are over 16 years old.  Skin check.  Lung cancer screening. You may have this screening every year starting at age 25 if you have a 30-pack-year history of smoking and currently smoke or have quit within the past 15 years.  Fecal occult blood test (FOBT) of the stool. You may have this test every year starting at age 45.  Flexible sigmoidoscopy or colonoscopy. You may have a sigmoidoscopy every 5 years or a colonoscopy every 10 years starting at age 26.  Hepatitis C blood test.  Hepatitis B blood test.  Sexually transmitted disease (STD) testing.  Diabetes screening. This is done by checking your blood sugar (glucose) after you have not eaten for a while (fasting). You may have this done every 1-3 years.  Bone density scan. This is done to screen for osteoporosis. You may have this done starting at age 57.  Mammogram. This may be done every 1-2 years. Talk to your health care provider about how often you should have regular mammograms. Talk with your health  care provider about your test results, treatment options, and if necessary, the need for more tests. Vaccines  Your health care provider may recommend certain vaccines, such as:  Influenza vaccine. This is recommended every year.  Tetanus, diphtheria, and acellular pertussis (Tdap, Td) vaccine. You may need a Td booster every 10 years.  Zoster vaccine. You may need this after age 97.  Pneumococcal 13-valent conjugate (PCV13) vaccine. One dose is recommended after age 33.  Pneumococcal polysaccharide (PPSV23) vaccine. One dose is recommended after age 55. Talk to your health care provider about which screenings and vaccines you need and how often you need them. This information is not intended to replace advice given to you by your health care provider. Make sure you discuss any questions you have with your health care provider. Document Released: 11/28/2015 Document Revised: 07/21/2016 Document Reviewed: 09/02/2015 Elsevier Interactive Patient Education  2017 [place] Prevention in the Home Falls can cause injuries. They can happen to people of all ages. There are many things you can do to make your home safe and to help prevent falls. What can I do on the outside of my home?  Regularly fix the edges of walkways and driveways and fix any cracks.  Remove anything that might make you trip as you walk through a door, such as a raised step or threshold.  Trim any bushes or trees on the path to your home.  Use bright outdoor lighting.  Clear any walking paths of anything that might make someone trip, such as rocks or tools.  Regularly check to see if handrails are loose or broken. Make sure that both sides of any steps have handrails.  Any raised decks and porches should have guardrails on the edges.  Have any leaves, snow, or ice cleared regularly.  Use sand or salt on walking paths during winter.  Clean up any spills in your garage right away. This includes oil or  grease spills. What can I do in the bathroom?  Use night lights.  Install grab bars by the toilet and in the tub and shower. Do not use towel bars as grab bars.  Use non-skid mats or decals in the tub or shower.  If you need to sit down in the shower, use a plastic, non-slip stool.  Keep the floor dry. Clean up any water that spills on the floor as soon as it happens.  Remove soap buildup in the tub or shower regularly.  Attach bath mats securely with double-sided non-slip rug tape.  Do not have throw rugs and other things on the floor that can make you trip. What can I do in the bedroom?  Use night lights.  Make sure that you have a light by your bed that is easy to reach.  Do not use any sheets or blankets that are too big for your bed. They should not hang down onto the floor.  Have a firm chair that has side arms. You can use this for support while you get dressed.  Do not have throw rugs and other things on the floor that can make you trip. What can I do in the kitchen?  Clean up any spills right away.  Avoid walking  on wet floors.  Keep items that you use a lot in easy-to-reach places.  If you need to reach something above you, use a strong step stool that has a grab bar.  Keep electrical cords out of the way.  Do not use floor polish or wax that makes floors slippery. If you must use wax, use non-skid floor wax.  Do not have throw rugs and other things on the floor that can make you trip. What can I do with my stairs?  Do not leave any items on the stairs.  Make sure that there are handrails on both sides of the stairs and use them. Fix handrails that are broken or loose. Make sure that handrails are as long as the stairways.  Check any carpeting to make sure that it is firmly attached to the stairs. Fix any carpet that is loose or worn.  Avoid having throw rugs at the top or bottom of the stairs. If you do have throw rugs, attach them to the floor with  carpet tape.  Make sure that you have a light switch at the top of the stairs and the bottom of the stairs. If you do not have them, ask someone to add them for you. What else can I do to help prevent falls?  Wear shoes that:  Do not have high heels.  Have rubber bottoms.  Are comfortable and fit you well.  Are closed at the toe. Do not wear sandals.  If you use a stepladder:  Make sure that it is fully opened. Do not climb a closed stepladder.  Make sure that both sides of the stepladder are locked into place.  Ask someone to hold it for you, if possible.  Clearly mark and make sure that you can see:  Any grab bars or handrails.  First and last steps.  Where the edge of each step is.  Use tools that help you move around (mobility aids) if they are needed. These include:  Canes.  Walkers.  Scooters.  Crutches.  Turn on the lights when you go into a dark area. Replace any light bulbs as soon as they burn out.  Set up your furniture so you have a clear path. Avoid moving your furniture around.  If any of your floors are uneven, fix them.  If there are any pets around you, be aware of where they are.  Review your medicines with your doctor. Some medicines can make you feel dizzy. This can increase your chance of falling. Ask your doctor what other things that you can do to help prevent falls. This information is not intended to replace advice given to you by your health care provider. Make sure you discuss any questions you have with your health care provider. Document Released: 08/28/2009 Document Revised: 04/08/2016 Document Reviewed: 12/06/2014 Elsevier Interactive Patient Education  2017 [employer].

## 2019-12-23 ENCOUNTER — Ambulatory Visit: Payer: Medicare Other | Attending: Internal Medicine

## 2019-12-23 DIAGNOSIS — Z23 Encounter for immunization: Secondary | ICD-10-CM | POA: Insufficient documentation

## 2019-12-23 NOTE — Progress Notes (Signed)
   Covid-19 Vaccination Clinic  Name:  Brenda Hudson    MRN: [id number] DOB: [birth year]  12/23/2019  [name] was observed post Covid-19 immunization for 15 minutes without incidence. She was provided with Vaccine Information Sheet and instruction to access the V-Safe system.   [name] was instructed to call 911 with any severe reactions post vaccine: [name] Difficulty breathing  . Swelling of your face and throat  . A fast heartbeat  . A bad rash all over your body  . Dizziness and weakness    Immunizations Administered    Name Date Dose VIS Date Route   Pfizer COVID-19 Vaccine 12/23/2019  4:16 PM 0.3 mL 10/26/2019 Intramuscular   Manufacturer: [place]   Lot: [id number]   [place]: [id number]

## 2020-01-02 ENCOUNTER — Encounter: Payer: Self-pay | Admitting: Family Medicine

## 2020-01-02 DIAGNOSIS — M8580 Other specified disorders of bone density and structure, unspecified site: Secondary | ICD-10-CM | POA: Insufficient documentation

## 2020-01-07 ENCOUNTER — Ambulatory Visit: Payer: Medicare Other

## 2020-01-17 ENCOUNTER — Ambulatory Visit: Payer: Medicare Other | Attending: Internal Medicine

## 2020-01-17 DIAGNOSIS — Z23 Encounter for immunization: Secondary | ICD-10-CM | POA: Insufficient documentation

## 2020-01-17 NOTE — Progress Notes (Signed)
   Covid-19 Vaccination Clinic  Name:  Brenda Hudson    MRN: [id number] DOB: [birth year]  01/17/2020  [name] was observed post Covid-19 immunization for 15 minutes without incident. She was provided with Vaccine Information Sheet and instruction to access the V-Safe system.   [name] was instructed to call 911 with any severe reactions post vaccine: [name] Difficulty breathing  . Swelling of face and throat  . A fast heartbeat  . A bad rash all over body  . Dizziness and weakness   Immunizations Administered    Name Date Dose VIS Date Route   Pfizer COVID-19 Vaccine 01/17/2020 11:32 AM 0.3 mL 10/26/2019 Intramuscular   Manufacturer: [place]   Lot: [id number]   [place]: [id number]

## 2020-01-25 ENCOUNTER — Ambulatory Visit: Payer: Medicare Other | Admitting: Family Medicine

## 2020-02-29 ENCOUNTER — Ambulatory Visit: Payer: Medicare Other | Admitting: Family Medicine

## 2020-03-03 ENCOUNTER — Ambulatory Visit: Payer: Medicare Other | Admitting: Psychology

## 2020-03-11 ENCOUNTER — Encounter: Payer: Self-pay | Admitting: Family Medicine

## 2020-03-11 ENCOUNTER — Ambulatory Visit (INDEPENDENT_AMBULATORY_CARE_PROVIDER_SITE_OTHER): Payer: Medicare Other

## 2020-03-11 ENCOUNTER — Other Ambulatory Visit: Payer: Self-pay

## 2020-03-11 ENCOUNTER — Ambulatory Visit (INDEPENDENT_AMBULATORY_CARE_PROVIDER_SITE_OTHER): Payer: Medicare Other | Admitting: Family Medicine

## 2020-03-11 VITALS — BP 104/80 | HR 56 | Temp 97.2°F | Ht 64.0 in | Wt 113.0 lb

## 2020-03-11 DIAGNOSIS — E785 Hyperlipidemia, unspecified: Secondary | ICD-10-CM

## 2020-03-11 DIAGNOSIS — M25562 Pain in left knee: Secondary | ICD-10-CM

## 2020-03-11 DIAGNOSIS — M48062 Spinal stenosis, lumbar region with neurogenic claudication: Secondary | ICD-10-CM

## 2020-03-11 DIAGNOSIS — M79641 Pain in right hand: Secondary | ICD-10-CM

## 2020-03-11 DIAGNOSIS — M19041 Primary osteoarthritis, right hand: Secondary | ICD-10-CM | POA: Diagnosis not present

## 2020-03-11 LAB — LIPID PANEL
Cholesterol: 205 mg/dL — ABNORMAL HIGH (ref 0–200)
HDL: 71.7 mg/dL (ref >39.00)
LDL Cholesterol: 116 mg/dL — ABNORMAL HIGH (ref 0–99)
NonHDL: 133.07
Total CHOL/HDL Ratio: 3
Triglycerides: 87 mg/dL (ref 0.0–149.0)
VLDL: 17.4 mg/dL (ref 0.0–40.0)

## 2020-03-11 LAB — CBC WITH DIFFERENTIAL/PLATELET
Basophils Absolute: 0 10*3/uL (ref 0.0–0.1)
Basophils Relative: 0.4 % (ref 0.0–3.0)
Eosinophils Absolute: 0.1 10*3/uL (ref 0.0–0.7)
Eosinophils Relative: 1.4 % (ref 0.0–5.0)
HCT: 40.5 % (ref 36.0–46.0)
Hemoglobin: 13.5 g/dL (ref 12.0–15.0)
Lymphocytes Relative: 18.4 % (ref 12.0–46.0)
Lymphs Abs: 1.3 10*3/uL (ref 0.7–4.0)
MCHC: 33.3 g/dL (ref 30.0–36.0)
MCV: 92.8 fl (ref 78.0–100.0)
Monocytes Absolute: 0.4 10*3/uL (ref 0.1–1.0)
Monocytes Relative: 5.5 % (ref 3.0–12.0)
Neutro Abs: 5.2 10*3/uL (ref 1.4–7.7)
Neutrophils Relative %: 74.3 % (ref 43.0–77.0)
Platelets: 249 10*3/uL (ref 150.0–400.0)
RBC: 4.37 Mil/uL (ref 3.87–5.11)
RDW: 12.9 % (ref 11.5–15.5)
WBC: 6.9 10*3/uL (ref 4.0–10.5)

## 2020-03-11 LAB — COMPREHENSIVE METABOLIC PANEL
ALT: 10 U/L (ref 0–35)
AST: 20 U/L (ref 0–37)
Albumin: 4.3 g/dL (ref 3.5–5.2)
Alkaline Phosphatase: 29 U/L — ABNORMAL LOW (ref 39–117)
BUN: 15 mg/dL (ref 6–23)
CO2: 34 mEq/L — ABNORMAL HIGH (ref 19–32)
Calcium: 9.9 mg/dL (ref 8.4–10.5)
Chloride: 100 mEq/L (ref 96–112)
Creatinine, Ser: 0.84 mg/dL (ref 0.40–1.20)
GFR: 67.73 mL/min (ref >60.00)
Glucose, Bld: 91 mg/dL (ref 70–99)
Potassium: 4.9 mEq/L (ref 3.5–5.1)
Sodium: 139 mEq/L (ref 135–145)
Total Bilirubin: 0.4 mg/dL (ref 0.2–1.2)
Total Protein: 6.7 g/dL (ref 6.0–8.3)

## 2020-03-11 MED ORDER — HYOSCYAMINE SULFATE 0.125 MG PO TABS: 0.1250 mg | ORAL_TABLET | ORAL | 5 refills | Status: DC | PRN

## 2020-03-11 MED ORDER — DICLOFENAC POTASSIUM 50 MG PO TABS: 50.0000 mg | ORAL_TABLET | Freq: Three times a day (TID) | ORAL | 0 refills | Status: DC

## 2020-03-11 NOTE — Progress Notes (Signed)
Phone [phone] In person visit   Subjective:   Brenda Hudson is a 69 y.o. year old very pleasant female patient who presents for/with See problem oriented charting Chief Complaint  Patient presents with   Arthritis    Patient mentioned that her arthritis has been bothering her in hands and knees.    GI Problem    Patient mentioned that her stomach is always hurting. She stated that sometimes she's really bloated, gassy, diarrhea.    Brenda Hudson,acting as a scribe for Garret Reddish, MD.,have documented all relevant documentation on the behalf of Garret Reddish, MD,as directed by  Garret Reddish, MD while in the presence of Garret Reddish, MD.  This visit occurred during the SARS-CoV-2 public health emergency.  Safety protocols were in place, including screening questions prior to the visit, additional usage of staff PPE, and extensive cleaning of exam room while observing appropriate contact time as indicated for disinfecting solutions.   Past Medical History-  Patient Active Problem List   Diagnosis Date Noted   Spinal stenosis of lumbar region with neurogenic claudication 08/21/2019    Priority: High   PAC (premature atrial contraction) 09/10/2014    Priority: High   Osteopenia 01/02/2020    Priority: Medium   Cervical spinal stenosis 08/21/2019    Priority: Medium   Headache, unspecified 08/21/2019    Priority: Low   PVC's (premature ventricular contractions) 04/04/2018    Priority: Low   Low back pain 09/30/2014    Priority: Low   IBS (irritable bowel syndrome) 09/30/2014    Priority: Low   Mitral valve prolapse 09/30/2014    Priority: Low   Palpitations 09/10/2014    Priority: Low   History of polymyalgia rheumatica 03/21/2008    Priority: Low    Medications- reviewed and updated Current Outpatient Medications  Medication Sig Dispense Refill   flecainide (TAMBOCOR) 100 MG tablet Take 1 tablet (100 mg total) by mouth 2 (two) times  daily. 180 tablet 3   fluticasone (FLONASE) 50 MCG/ACT nasal spray Place 1 spray into the nose daily.     guaiFENesin (MUCINEX) 600 MG 12 hr tablet Take 600 mg by mouth 2 (two) times daily. As needed for allergies     Ibuprofen (ADVIL) 200 MG CAPS Take 400 mg by mouth in the morning and at bedtime.      metoprolol succinate (TOPROL-XL) 25 MG 24 hr tablet Take 1 tablet (25 mg total) by mouth daily. 90 tablet 3   No current facility-administered medications for this visit.     Objective:  BP 104/80    Pulse (!) 56    Temp (!) 97.2 F (36.2 C) (Temporal)    Ht 5\' 4"  (1.626 m)    Wt 113 lb (51.3 kg)    SpO2 99%    BMI 19.40 kg/m  Gen: NAD, resting comfortably CV: RRR no murmurs rubs or gallops Lungs: CTAB no crackles, wheeze, rhonchi Abdomen: soft/nontender except for mod pain in RLQ, LLQ and suprapubic /nondistended/normal bowel sounds. No rebound or guarding.  Ext: no edema Skin: warm, dry MSK: Some tenderness with palpation over PIP and DIP joints.  Good range of motion of the knees.  Some pain with meniscal testing medially.  Reports pain with palpation over pes anserine bursa-no swelling or erythema noted    Assessment and Plan   #Musculoskeletal concerns-hand pain, left knee pain S: Patient with bilateral hand pain right worse than left for some time.  She is worried because she feels like  she is getting some enlargement of joints and deviation of fingers-had a family member it is happened to him it particular concerns her.  She has had left knee pain for about 2 weeks-no fall or injury.  Hurts just below and medial to patella tendon. A/P:  For hand pain-I suspect underlying arthritis.  Patient does have some stiffness in the morning but only last for a few minutes-do not strongly suspect rheumatoid arthritis but will get some screening test with ANA and rheumatoid factor.  We will also get hand x-ray on the right.  Also has left knee pain-I suspect pes anserine bursitis-tender  in this area though not obviously swollen or red  She has been trying ibuprofen without significant relief.  We will trial a short course of diclofenac.  I also want her to ice her knees.  Consider imaging if no improvement on follow-up-likely would be ordered by sports medicine  If either areas fail to improve recommended  We discussed possibly using Voltaren gel but she is worried that her cancer may like this and it can be dangerous for them.  #Irritable bowel syndrome/history of colitis S: Patient was seen in January 2020 by Dr. Silverio Decamp.  She is having some trouble swallowing as well as GI issues.  Ultimately she had EGD and colonoscopy.  Colonoscopy with nonspecific colitis-she did improve with ciprofloxacin and Flagyl.  She has had intermittent diarrhea and constipation.  She does have abdominal cramping in lower abdomen which improves with bowel movements.  She continues have a fair amount of bloating/gas/diarrhea.  Last note from GI suggested 9-month follow-up but that was in the middle of the COVID-19 pandemic A/P: Patient with history of IBS with ongoing abdominal cramping relieved by bowel movements-I think a trial of hyoscyamine is in order.  I also recommended a follow-up with gastroenterology.  With lower abdominal pain I also offered CT of abdomen pelvis-she declines this for now and would like to follow-up with Dr. Silverio Decamp first  #hyperlipidemia S: Medication: None Lab Results  Component Value Date   CHOL 189 08/28/2013   HDL 78.30 08/28/2013   LDLCALC 103 (H) 08/28/2013   LDLDIRECT 149.9 01/20/2011   TRIG 38.0 08/28/2013   CHOLHDL 2 08/28/2013   A/P: Has been several years since we have updated lipid panel-patient agrees to lipid panel today.  Will calculate 10-year ASCVD risk.  For now remain off medication until we have more information   Lumbar stenosis : S: surgery recommended by Dr. Sherwood Gambler. 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 She is  working on getting things lined up to be able to move forward with that. She has taken Gabapentin at bed time but had sedated felling. Is taking Ibuprofen bid.  A/P:   Poor control of pain.  Patient does not feel like she can tolerate stronger pain medications other than potentially a stronger level of ibuprofen-Will check kidney function if ok will do trial of oral diclofenac-went ahead and ordered this today   % I did not get a chance to check in with patient today about prior headaches.  Prior sinus CT was reassuring.  ENT recommended neurology follow-up but she had declined previously-we will plan to check in at follow-up   Recommended follow up: Return in about 6 months (around 09/10/2020) for follow up- or sooner if needed. Future Appointments  Date Time Provider [place]  04/02/2020  3:00 PM Oren Binet, PhD [facility] None    Lab/Order associations:  ICD-10-CM   1. Hand pain, right  M79.641 Rheumatoid Factor    ANA    DG Hand Complete Right  2. Hyperlipidemia, unspecified hyperlipidemia type  E78.5 CBC with Differential/Platelet    Comprehensive metabolic panel    Lipid panel    Meds ordered this encounter  Medications   diclofenac (CATAFLAM) 50 MG tablet    Sig: Take 1 tablet (50 mg total) by mouth 3 (three) times daily.    Dispense:  30 tablet    Refill:  0   hyoscyamine (LEVSIN) 0.125 MG tablet    Sig: Take 1 tablet (0.125 mg total) by mouth every 4 (four) hours as needed for cramping.    Dispense:  30 tablet    Refill:  5   The entirety of the information documented in the History of Present Illness, Review of Systems and Physical Exam were personally obtained by me. Portions of this information were initially documented by the CMA and reviewed by me for thoroughness and accuracy.   Garret Reddish, MD    Garret Reddish, MD    Garret Reddish, MD   Return precautions advised.  Garret Reddish, MD

## 2020-03-11 NOTE — Patient Instructions (Addendum)
Please stop by x-ray and lab before you go If you have mychart- we will send your results within 3 business days of [place] receiving them.  If you do not have mychart- we will call you about results within 5 business days of [place] receiving them.   For knee/hand pain 1.  lets try 10 days of diclofenac.  I think he may have an inflamed bursa of the left knee.I want you to ice your knee 3x a day for 20 minutes.  If you are not improving significantly within 2 weeks please let me place a referral to sports medicine for you. 2. Do not take Advil with diclofenac .  Can still take Tylenol If you have stomach upset consider pepcid. Stop diclofenac if you have any blood in stool and let us know asap.   For IBS 1.Call Dr. Silverio Decamp. For follow up.  2.Try hyoscyamine for abdominal cramping       Homemade gel ice packs These homemade gel ice packs are more comfortable than a bag of frozen peas, because they mold better to your body without the lumps and bumps. They can be made for under $3.  What you need: . 1 quart or 1 gallon plastic freezer bags (depending on how large you want the cold pack)  . 2 cups water  . 1 cup rubbing alcohol Instructions: 1. Fill the plastic freezer bag with 1 cup of rubbing alcohol and 2 cups of water. 2. Try to get as much air out of the freezer bag before sealing it shut. 3. Place the bag and its contents inside a second freezer bag to contain any leakage. 4. Leave the bag in the freezer for at least an hour. 5. When it's ready, place a towel between the gel pack and bare skin to avoid burning the skin. An alternative filler is simply to use dish soap, which has a gel-like consistency and will also freeze/retain the cold.   Recommended follow up: Return in about 6 months (around 09/10/2020) for follow up- or sooner if needed.

## 2020-03-12 LAB — RHEUMATOID FACTOR: Rheumatoid fact SerPl-aCnc: <14 IU/mL (ref <14)

## 2020-03-12 LAB — ANA: Anti Nuclear Antibody (ANA): NEGATIVE

## 2020-03-13 DIAGNOSIS — Z6820 Body mass index (BMI) 20.0-20.9, adult: Secondary | ICD-10-CM | POA: Diagnosis not present

## 2020-03-13 DIAGNOSIS — G43909 Migraine, unspecified, not intractable, without status migrainosus: Secondary | ICD-10-CM | POA: Insufficient documentation

## 2020-03-13 DIAGNOSIS — Z124 Encounter for screening for malignant neoplasm of cervix: Secondary | ICD-10-CM | POA: Diagnosis not present

## 2020-03-13 DIAGNOSIS — Z1231 Encounter for screening mammogram for malignant neoplasm of breast: Secondary | ICD-10-CM | POA: Diagnosis not present

## 2020-04-02 ENCOUNTER — Ambulatory Visit (INDEPENDENT_AMBULATORY_CARE_PROVIDER_SITE_OTHER): Payer: Medicare Other | Admitting: Psychology

## 2020-04-02 DIAGNOSIS — F4321 Adjustment disorder with depressed mood: Secondary | ICD-10-CM

## 2020-04-16 ENCOUNTER — Other Ambulatory Visit: Payer: Self-pay | Admitting: Cardiology

## 2020-04-16 DIAGNOSIS — I493 Ventricular premature depolarization: Secondary | ICD-10-CM

## 2020-04-21 ENCOUNTER — Ambulatory Visit (INDEPENDENT_AMBULATORY_CARE_PROVIDER_SITE_OTHER): Payer: Medicare Other | Admitting: Psychology

## 2020-04-21 DIAGNOSIS — F4321 Adjustment disorder with depressed mood: Secondary | ICD-10-CM | POA: Diagnosis not present

## 2020-05-02 ENCOUNTER — Ambulatory Visit: Payer: Medicare Other | Admitting: Psychology

## 2020-05-06 ENCOUNTER — Ambulatory Visit (INDEPENDENT_AMBULATORY_CARE_PROVIDER_SITE_OTHER): Payer: Medicare Other | Admitting: Psychology

## 2020-05-06 ENCOUNTER — Ambulatory Visit: Payer: Medicare Other | Admitting: Psychology

## 2020-05-06 DIAGNOSIS — F4321 Adjustment disorder with depressed mood: Secondary | ICD-10-CM

## 2020-05-11 ENCOUNTER — Other Ambulatory Visit: Payer: Self-pay | Admitting: Cardiology

## 2020-05-11 DIAGNOSIS — I341 Nonrheumatic mitral (valve) prolapse: Secondary | ICD-10-CM

## 2020-05-11 DIAGNOSIS — R002 Palpitations: Secondary | ICD-10-CM

## 2020-05-13 ENCOUNTER — Other Ambulatory Visit: Payer: Self-pay

## 2020-05-13 ENCOUNTER — Ambulatory Visit (INDEPENDENT_AMBULATORY_CARE_PROVIDER_SITE_OTHER): Payer: Medicare Other | Admitting: Cardiology

## 2020-05-13 ENCOUNTER — Encounter: Payer: Self-pay | Admitting: Cardiology

## 2020-05-13 VITALS — BP 110/68 | HR 56 | Ht 63.5 in | Wt 113.0 lb

## 2020-05-13 DIAGNOSIS — R002 Palpitations: Secondary | ICD-10-CM

## 2020-05-13 DIAGNOSIS — I341 Nonrheumatic mitral (valve) prolapse: Secondary | ICD-10-CM | POA: Diagnosis not present

## 2020-05-13 DIAGNOSIS — I493 Ventricular premature depolarization: Secondary | ICD-10-CM | POA: Diagnosis not present

## 2020-05-13 NOTE — Patient Instructions (Signed)
Medication Instructions:   Your physician recommends that you continue on your current medications as directed. Please refer to the Current Medication list given to you today.]  *If you need a refill on your cardiac medications before your next appointment, please call your pharmacy*  Lab Work:  SAME DAY AS ECHO IS [place] CHECK TSH AT THAT TIME.  If you have labs (blood work) drawn today and your tests are completely normal, you will receive your results only by: [name] MyChart Message (if you have MyChart) OR . A paper copy in the mail If you have any lab test that is abnormal or we need to change your treatment, we will call you to review the results.  Testing/Procedures:  Your physician has requested that you have an echocardiogram. Echocardiography is a painless test that uses sound waves to create images of your heart. It provides your doctor with information about the size and shape of your heart and how well your heart's chambers and valves are working. This procedure takes approximately one hour. There are no restrictions for this procedure.  PATIENT SHOULD HAVE LAB DONE SAME DAY   ZIO XT- Long Term Monitor Instructions   Your physician has requested you wear your ZIO patch monitor__7_____days.   This is a single patch monitor.  [employer] supplies one patch monitor per enrollment.  Additional stickers are not available.   Please do not apply patch if you will be having a Nuclear Stress Test, Echocardiogram, Cardiac CT, MRI, or Chest Xray during the time frame you would be wearing the monitor. The patch cannot be worn during these tests.  You cannot remove and re-apply the ZIO XT patch monitor.   Your ZIO patch monitor will be sent USPS Priority mail from [facility] directly to your home address. The monitor may also be mailed to a PO BOX if home delivery is not available.   It may take 3-5 days to receive your monitor after you have been enrolled.   Once you have  received you monitor, please review enclosed instructions.  Your monitor has already been registered assigning a specific monitor serial # to you.   Applying the monitor   Shave hair from upper left chest.   Hold abrader disc by orange tab.  Rub abrader in 40 strokes over left upper chest as indicated in your monitor instructions.   Clean area with 4 enclosed alcohol pads .  Use all pads to assure are is cleaned thoroughly.  Let dry.   Apply patch as indicated in monitor instructions.  Patch will be place under collarbone on left side of chest with arrow pointing upward.   Rub patch adhesive wings for 2 minutes.Remove white label marked "1".  Remove white label marked "2".  Rub patch adhesive wings for 2 additional minutes.   While looking in a mirror, press and release button in center of patch.  A small green light will flash 3-4 times .  This will be your only indicator the monitor has been turned on.     Do not shower for the first 24 hours.  You may shower after the first 24 hours.   Press button if you feel a symptom. You will hear a small click.  Record Date, Time and Symptom in the Patient Log Book.   When you are ready to remove patch, follow instructions on last 2 pages of Patient Log Book.  Stick patch monitor onto last page of Patient Log Book.   Place Patient Log  Book in [facility] box.  Use locking tab on box and tape box closed securely.  The [employer] has [employer] on it.  Please place in mailbox as soon as possible.  Your physician should have your test results approximately 7 days after the monitor has been mailed back to [facility].   Call [place] at [phone] if you have questions regarding your ZIO XT patch monitor.  Call them immediately if you see an orange light blinking on your monitor.   If your monitor falls off in less than 4 days contact our Monitor department at [phone].  If your monitor becomes loose or falls off  after 4 days call [employer] at [phone] for suggestions on securing your monitor.     Follow-Up: At [facility], you and your health needs are our priority.  As part of our continuing mission to provide you with exceptional heart care, we have created designated Provider Care Teams.  These Care Teams include your primary Cardiologist (physician) and Advanced Practice Providers (APPs -  Physician Assistants and Nurse Practitioners) who all work together to provide you with the care you need, when you need it.  We recommend signing up for the patient portal called "MyChart".  Sign up information is provided on this After Visit Summary.  MyChart is used to connect with patients for Virtual Visits (Telemedicine).  Patients are able to view lab/test results, encounter notes, upcoming appointments, etc.  Non-urgent messages can be sent to your provider as well.   To learn more about what you can do with MyChart, go to [email or web address].    Your next appointment:   6 month(s)  The format for your next appointment:   In Person  Provider:   Ena Dawley, MD

## 2020-05-13 NOTE — Progress Notes (Signed)
Cardiology Office Note:    Date:  05/14/2020   ID:  Brenda, Hudson 1951-08-01, MRN [id number]  PCP:  [name], MD  [facility] Cardiologist:  Ena Dawley, MD  [facility] Electrophysiologist:  None   Referring MD: [name], MD   Chief complaint you have plenty of: palpitations  History of Present Illness:    Brenda Hudson is a 69 y.o. female with a hx of frequent PVCs, high burden PACs ( 2300 monomorphic PACs in 1 day by monitor, in 2015).  She was referred to EP and subsequently started on flecainide.  After initiation of flecainide, she had a follow-up exercise stress test that was negative for ischemia.    In February 2018, she had an office visit with Dr. Meda Coffee and she was hypotensive and bradycardiac.  Her Toprol XL was decreased and flecainide increased. Her hypotension and bradycardia resolved.  She also has a history of chest pain with coronary CTA in 2015 showing normal coronaries and calcium score 0.  Today she states that her palpitations have worsened, she feels them daily, they are not associated with chest pain dizziness or syncope however they can last up to minutes.  She denies any other symptoms such as lower extremity edema orthopnea proximal nocturnal dyspnea.  She does not have side effects from her medications and she has been compliant.  The patient does not have symptoms concerning for COVID-19 infection (fever, chills, cough, or new shortness of breath).  She has been avoiding large crowds.  Past Medical History:  Diagnosis Date  . Allergy   . Anxiety   . Arthritis   . Diverticulosis   . Frequent PVCs    and PAC per pt/has had along time  . GERD (gastroesophageal reflux disease)   . Heart murmur   . Hx of echocardiogram    a. Echo 12/13:  EF 55-60%, mild MR, normal diast fxn, no MVP  . IBS (irritable bowel syndrome)   . MVP (mitral valve prolapse)   . Osteoporosis   . Palpitations   . Polymyalgia rheumatica  ([place])   . Rectal pain   . Spinal stenosis     Past Surgical History:  Procedure Laterality Date  . COLONOSCOPY    . DILATION AND CURETTAGE OF UTERUS    . TONSILLECTOMY    . TUBAL LIGATION      Current Medications: Current Meds  Medication Sig  . ACETAMINOPHEN PO Take by mouth as needed.  . flecainide (TAMBOCOR) 100 MG tablet Take 1 tablet (100 mg total) by mouth 2 (two) times daily. Please keep upcoming appt in June with Dr. Meda Coffee before anymore refills. Thank you  . fluticasone (FLONASE) 50 MCG/ACT nasal spray Place 1 spray into the nose daily.  [name] guaiFENesin (MUCINEX) 600 MG 12 hr tablet Take 600 mg by mouth 2 (two) times daily. As needed for allergies  . hyoscyamine (LEVSIN) 0.125 MG tablet Take 1 tablet (0.125 mg total) by mouth every 4 (four) hours as needed for cramping.  . Ibuprofen (ADVIL) 200 MG CAPS Take 400 mg by mouth in the morning and at bedtime.   . metoprolol succinate (TOPROL-XL) 25 MG 24 hr tablet TAKE 1 TABLET BY MOUTH EVERY DAY     Allergies:   Cefaclor, Penicillins, and Sulfonamide derivatives   Social History   Socioeconomic History  . Marital status: Widowed    Spouse name: Not on file  . Number of children: 2  . Years of education: Not  on file  . Highest education level: Not on file  Occupational History  . Occupation: Pharmacist, hospital    Comment: Special Education   Tobacco Use  . Smoking status: Never Smoker  . Smokeless tobacco: Never Used  Substance and Sexual Activity  . Alcohol use: No  . Drug use: No  . Sexual activity: Not on file  Other Topics Concern  . Not on file  Social History Narrative   Widowed in 02-14-2013 (husband sudden death MI despite being in great shape). 2 daughters. Oldest daughter pregnant in 09/2014-Phd student at [employer] in early [place]. Younger daughter [name] grad student [employer] in [place].       [employer]. Both she and her husband. Husband went to [employer] for lawschool. 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 Difficulty of Paying Living Expenses:   Food Insecurity:   . Worried About Charity fundraiser in the Last Year:   . Arboriculturist in the Last Year:   Transportation Needs:   . Film/video editor (Medical):   [name] Lack of Transportation (Non-Medical):   Physical Activity:   . Days of Exercise per Week:   . Minutes of Exercise per Session:   Stress:   . Feeling of Stress :   Social Connections:   . Frequency of Communication with Friends and Family:   . Frequency of Social Gatherings with Friends and Family:   . Attends Religious Services:   . Active Member of Clubs or Organizations:   . Attends Archivist Meetings:   [name] Marital Status:      Family History: The patient's family history includes Colon polyps in her father; Diabetes in her father; Heart disease in her father; Kidney disease in her father; Sleep apnea in her mother. There is no history of Esophageal cancer, Rectal cancer, Stomach cancer, or Colon cancer.  ROS:   Please see the history of present illness.    All other systems reviewed and are negative.  EKGs/Labs/Other Studies Reviewed:    The following studies were reviewed today:  EKG:  EKG is ordered today.  The ekg ordered today demonstrates normal sinus rhythm, normal EKG, personally reviewed.  Recent Labs: 03/11/2020: ALT 10; BUN 15; Creatinine, Ser 0.84; Hemoglobin 13.5; Platelets 249.0; Potassium 4.9; Sodium 139  Recent Lipid Panel    Component Value Date/Time   CHOL 205 (H) 03/11/2020 1217   TRIG 87.0 03/11/2020 1217   HDL 71.70 03/11/2020 1217   CHOLHDL 3 03/11/2020 1217   VLDL 17.4 03/11/2020 1217   LDLCALC 116 (H) 03/11/2020 1217   LDLDIRECT 149.9 01/20/2011 0815    Physical  Exam:    VS:  BP 110/68   Pulse (!) 56   Ht 5' 3.5" (1.613 m)   Wt 113 lb (51.3 kg)   SpO2 97%   BMI 19.70 kg/m     Wt Readings from Last 3 Encounters:  05/13/20 113 lb (51.3 kg)  03/11/20 113 lb (51.3 kg)  12/21/19 115 lb (52.2 kg)    GEN:  Well nourished, well developed in no acute distress HEENT: Normal NECK: No JVD; No carotid bruits LYMPHATICS: No lymphadenopathy CARDIAC: RRR, no murmurs, rubs, gallops RESPIRATORY:  Clear to  auscultation without rales, wheezing or rhonchi  ABDOMEN: Soft, non-tender, non-distended MUSCULOSKELETAL:  No edema; No deformity  SKIN: Warm and dry NEUROLOGIC:  Alert and oriented x 3 PSYCHIATRIC:  Normal affect   ASSESSMENT:    1. Palpitations   2. Frequent PVCs   3. Mitral valve prolapse    PLAN:    In order of problems listed above:  1. PACs and PVCs: h/o high burden atrial ectopy, requiring AAD therapy w/ Flecainide +  blocker therapy w/ Metoprolol.  Her symptoms are worsening, will obtain new 7-day Zio patch monitor. 2. Mitral valve prolapse -last echo in 2013, will obtain new echocardiogram.   Medication Adjustments/Labs and Tests Ordered: Current medicines are reviewed at length with the patient today.  Concerns regarding medicines are outlined above.  Orders Placed This Encounter  Procedures  . TSH  . LONG TERM MONITOR (3-14 DAYS)  . EKG 12-Lead  . ECHOCARDIOGRAM COMPLETE   No orders of the defined types were placed in this encounter.   Patient Instructions  Medication Instructions:   Your physician recommends that you continue on your current medications as directed. Please refer to the Current Medication list given to you today.]  *If you need a refill on your cardiac medications before your next appointment, please call your pharmacy*  Lab Work:  SAME DAY AS ECHO IS [place] CHECK TSH AT THAT TIME.  If you have labs (blood work) drawn today and your tests are completely normal, you will receive your  results only by: [name] MyChart Message (if you have MyChart) OR . A paper copy in the mail If you have any lab test that is abnormal or we need to change your treatment, we will call you to review the results.  Testing/Procedures:  Your physician has requested that you have an echocardiogram. Echocardiography is a painless test that uses sound waves to create images of your heart. It provides your doctor with information about the size and shape of your heart and how well your heart's chambers and valves are working. This procedure takes approximately one hour. There are no restrictions for this procedure.  PATIENT SHOULD HAVE LAB DONE SAME DAY   ZIO XT- Long Term Monitor Instructions   Your physician has requested you wear your ZIO patch monitor__7_____days.   This is a single patch monitor.  [employer] supplies one patch monitor per enrollment.  Additional stickers are not available.   Please do not apply patch if you will be having a Nuclear Stress Test, Echocardiogram, Cardiac CT, MRI, or Chest Xray during the time frame you would be wearing the monitor. The patch cannot be worn during these tests.  You cannot remove and re-apply the ZIO XT patch monitor.   Your ZIO patch monitor will be sent USPS Priority mail from [facility] directly to your home address. The monitor may also be mailed to a PO BOX if home delivery is not available.   It may take 3-5 days to receive your monitor after you have been enrolled.   Once you have received you monitor, please review enclosed instructions.  Your monitor has already been registered assigning a specific monitor serial # to you.   Applying the monitor   Shave hair from upper left chest.   Hold abrader disc by orange tab.  Rub abrader in 40 strokes over left upper chest as indicated in your monitor instructions.   Clean area with 4 enclosed alcohol pads .  Use all pads to assure are is cleaned  thoroughly.  Let dry.   Apply patch as  indicated in monitor instructions.  Patch will be place under collarbone on left side of chest with arrow pointing upward.   Rub patch adhesive wings for 2 minutes.Remove white label marked "1".  Remove white label marked "2".  Rub patch adhesive wings for 2 additional minutes.   While looking in a mirror, press and release button in center of patch.  A small green light will flash 3-4 times .  This will be your only indicator the monitor has been turned on.     Do not shower for the first 24 hours.  You may shower after the first 24 hours.   Press button if you feel a symptom. You will hear a small click.  Record Date, Time and Symptom in the Patient Log Book.   When you are ready to remove patch, follow instructions on last 2 pages of Patient Log Book.  Stick patch monitor onto last page of Patient Log Book.   Place Patient Log Book in [place] box.  Use locking tab on box and tape box closed securely.  The [employer] and [employer] has [employer] on it.  Please place in mailbox as soon as possible.  Your physician should have your test results approximately 7 days after the monitor has been mailed back to [facility].   Call [place] at [phone] if you have questions regarding your ZIO XT patch monitor.  Call them immediately if you see an orange light blinking on your monitor.   If your monitor falls off in less than 4 days contact our Monitor department at [phone].  If your monitor becomes loose or falls off after 4 days call [employer] at [phone] for suggestions on securing your monitor.     Follow-Up: At [facility], you and your health needs are our priority.  As part of our continuing mission to provide you with exceptional heart care, we have created designated Provider Care Teams.  These Care Teams include your primary Cardiologist (physician) and Advanced Practice Providers (APPs -  Physician Assistants and Nurse Practitioners) who all work  together to provide you with the care you need, when you need it.  We recommend signing up for the patient portal called "MyChart".  Sign up information is provided on this After Visit Summary.  MyChart is used to connect with patients for Virtual Visits (Telemedicine).  Patients are able to view lab/test results, encounter notes, upcoming appointments, etc.  Non-urgent messages can be sent to your provider as well.   To learn more about what you can do with MyChart, go to [email or web address].    Your next appointment:   6 month(s)  The format for your next appointment:   In Person  Provider:   Ena Dawley, MD     Signed, Ena Dawley, MD  05/14/2020 1:50 PM    [place]

## 2020-05-14 ENCOUNTER — Encounter: Payer: Self-pay | Admitting: *Deleted

## 2020-05-14 NOTE — Progress Notes (Signed)
Patient ID: Brenda Hudson, female   DOB: November 10, 1951, 69 y.o.   MRN: [id number] Patient enrolled for 7 day ZIO XT long term holter monitor to be mailed to her home.

## 2020-05-22 ENCOUNTER — Ambulatory Visit (INDEPENDENT_AMBULATORY_CARE_PROVIDER_SITE_OTHER): Payer: Medicare Other

## 2020-05-22 DIAGNOSIS — I493 Ventricular premature depolarization: Secondary | ICD-10-CM | POA: Diagnosis not present

## 2020-05-22 DIAGNOSIS — I341 Nonrheumatic mitral (valve) prolapse: Secondary | ICD-10-CM | POA: Diagnosis not present

## 2020-05-22 DIAGNOSIS — R002 Palpitations: Secondary | ICD-10-CM | POA: Diagnosis not present

## 2020-06-05 ENCOUNTER — Other Ambulatory Visit (HOSPITAL_COMMUNITY): Payer: Medicare Other

## 2020-06-05 ENCOUNTER — Other Ambulatory Visit: Payer: Medicare Other

## 2020-06-11 ENCOUNTER — Ambulatory Visit (INDEPENDENT_AMBULATORY_CARE_PROVIDER_SITE_OTHER): Payer: Medicare Other | Admitting: Psychology

## 2020-06-11 DIAGNOSIS — F4321 Adjustment disorder with depressed mood: Secondary | ICD-10-CM | POA: Diagnosis not present

## 2020-06-13 DIAGNOSIS — L255 Unspecified contact dermatitis due to plants, except food: Secondary | ICD-10-CM | POA: Diagnosis not present

## 2020-06-13 DIAGNOSIS — L03113 Cellulitis of right upper limb: Secondary | ICD-10-CM | POA: Diagnosis not present

## 2020-06-13 DIAGNOSIS — S50869A Insect bite (nonvenomous) of unspecified forearm, initial encounter: Secondary | ICD-10-CM | POA: Diagnosis not present

## 2020-06-18 DIAGNOSIS — R002 Palpitations: Secondary | ICD-10-CM | POA: Diagnosis not present

## 2020-06-18 DIAGNOSIS — I341 Nonrheumatic mitral (valve) prolapse: Secondary | ICD-10-CM | POA: Diagnosis not present

## 2020-06-18 DIAGNOSIS — I493 Ventricular premature depolarization: Secondary | ICD-10-CM | POA: Diagnosis not present

## 2020-06-24 ENCOUNTER — Telehealth: Payer: Self-pay | Admitting: *Deleted

## 2020-06-24 DIAGNOSIS — I471 Supraventricular tachycardia: Secondary | ICD-10-CM

## 2020-06-24 DIAGNOSIS — Z79899 Other long term (current) drug therapy: Secondary | ICD-10-CM

## 2020-06-24 DIAGNOSIS — R9431 Abnormal electrocardiogram [ECG] [EKG]: Secondary | ICD-10-CM

## 2020-06-24 DIAGNOSIS — R002 Palpitations: Secondary | ICD-10-CM

## 2020-06-24 NOTE — Telephone Encounter (Signed)
Sueanne Margarita, MD  06/20/2020 8:35 PM EDT Back to Top    Patient continues to have episodes of nonsustained atrial tachycardia despite Flecainide 100mg  BID and Toprol XL 25mg  daily. Cannot increase BB as she has had bradycardia on higher doses. Please refer back to Dr. Lovena Le who has seen patient in the past    Spoke with the pt and endorsed to her, her monitor results and recommendations, per Dr. Radford Pax, covering for Dr. Meda Coffee while on vacation.  Informed the pt that Dr. Radford Pax advised that we refer her back to Dr. Lovena Le in EP (last seen in 2015) for further management of this, being she is on Flecainide, and max tolerable dose of her BB (increase in BB has caused her bradycardia in the past). Informed the pt that I will place the referral to Dr. Lovena Le in the system, and send a message to his [facility], to call her back and arrange this appt.  Pt verbalized understanding and agrees with this plan. Pt appreciative for all the assistance.

## 2020-06-25 ENCOUNTER — Ambulatory Visit: Payer: Medicare Other | Admitting: Psychology

## 2020-06-30 ENCOUNTER — Ambulatory Visit (HOSPITAL_COMMUNITY): Payer: Medicare Other | Attending: Internal Medicine

## 2020-06-30 ENCOUNTER — Other Ambulatory Visit: Payer: Self-pay

## 2020-06-30 DIAGNOSIS — I341 Nonrheumatic mitral (valve) prolapse: Secondary | ICD-10-CM | POA: Diagnosis not present

## 2020-06-30 DIAGNOSIS — R002 Palpitations: Secondary | ICD-10-CM

## 2020-06-30 DIAGNOSIS — I493 Ventricular premature depolarization: Secondary | ICD-10-CM

## 2020-06-30 LAB — ECHOCARDIOGRAM COMPLETE
Area-P 1/2: 3.74 cm2
S' Lateral: 2.7 cm

## 2020-07-15 ENCOUNTER — Encounter: Payer: Self-pay | Admitting: Internal Medicine

## 2020-07-15 ENCOUNTER — Other Ambulatory Visit: Payer: Self-pay

## 2020-07-15 ENCOUNTER — Ambulatory Visit (INDEPENDENT_AMBULATORY_CARE_PROVIDER_SITE_OTHER): Payer: Medicare Other | Admitting: Internal Medicine

## 2020-07-15 VITALS — BP 106/68 | HR 51 | Ht 63.0 in | Wt 112.0 lb

## 2020-07-15 DIAGNOSIS — R002 Palpitations: Secondary | ICD-10-CM | POA: Diagnosis not present

## 2020-07-15 NOTE — Progress Notes (Signed)
HPI Brenda Hudson is referred today by Dr. Meda Coffee for evaluation of palpitations and NS AT. She was seen by me several years ago and has been maintained on flecainide. She denies ETOH or caffeine abuse but admits to not sleeping as much as she should. She has just moved into a new house. She is anxious as her husband who was in very good health and worked out regularly died suddenly. Allergies  Allergen Reactions  . Cefaclor     REACTION: rash  . Penicillins     REACTION: rash  . Sulfonamide Derivatives     REACTION: swelling     Current Outpatient Medications  Medication Sig Dispense Refill  . ACETAMINOPHEN PO Take by mouth as needed.    . flecainide (TAMBOCOR) 100 MG tablet Take 1 tablet (100 mg total) by mouth 2 (two) times daily. Please keep upcoming appt in June with Dr. Meda Coffee before anymore refills. Thank you 180 tablet 0  . fluticasone (FLONASE) 50 MCG/ACT nasal spray Place 1 spray into the nose daily.    [name] guaiFENesin (MUCINEX) 600 MG 12 hr tablet Take 600 mg by mouth 2 (two) times daily. As needed for allergies    . Ibuprofen (ADVIL) 200 MG CAPS Take 400 mg by mouth in the morning and at bedtime.     . metoprolol succinate (TOPROL-XL) 25 MG 24 hr tablet TAKE 1 TABLET BY MOUTH EVERY DAY 90 tablet 0   No current facility-administered medications for this visit.     Past Medical History:  Diagnosis Date  . Allergy   . Anxiety   . Arthritis   . Diverticulosis   . Frequent PVCs    and PAC per pt/has had along time  . GERD (gastroesophageal reflux disease)   . Heart murmur   . Hx of echocardiogram    a. Echo 12/13:  EF 55-60%, mild MR, normal diast fxn, no MVP  . IBS (irritable bowel syndrome)   . MVP (mitral valve prolapse)   . Osteoporosis   . Palpitations   . Polymyalgia rheumatica (Brenda Hudson)   . Rectal pain   . Spinal stenosis     ROS:   All systems reviewed and negative except as noted in the HPI.   Past Surgical History:  Procedure Laterality Date  .  COLONOSCOPY    . DILATION AND CURETTAGE OF UTERUS    . TONSILLECTOMY    . TUBAL LIGATION       Family History  Problem Relation Age of Onset  . Sleep apnea Mother   . Diabetes Father        in 43s  . Kidney disease Father        DM related, renal failure  . Heart disease Father        CVA young age, Smoker, overweight  . Colon polyps Father   . Esophageal cancer Neg Hx   . Rectal cancer Neg Hx   . Stomach cancer Neg Hx   . Colon cancer Neg Hx      Social History   Socioeconomic History  . Marital status: Widowed    Spouse name: Not on file  . Number of children: 2  . Years of education: Not on file  . Highest education level: Not on file  Occupational History  . Occupation: Pharmacist, hospital    Comment: Special Education   Tobacco Use  . Smoking status: Never Smoker  . Smokeless tobacco: Never Used  Substance and Sexual Activity  .  Alcohol use: No  . Drug use: No  . Sexual activity: Not on file  Other Topics Concern  . Not on file  Social History Narrative   Widowed in 2013/02/24 (husband sudden death MI despite being in great shape). 2 daughters. Oldest daughter pregnant in 09/2014-Phd student at [employer] in early [place]. Younger daughter Brenda Hudson grad student [employer] in [place].       [employer]. Both she and her husband. Husband went to [employer] for lawschool. 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 Difficulty of Paying Living Expenses: Not on file  Food Insecurity:   . Worried About Charity fundraiser in the Last Year: Not on file  . Ran Out of Food in the Last Year: Not on file  Transportation Needs:   . Lack of Transportation (Medical): Not on file  . Lack of Transportation (Non-Medical): Not on file    Physical Activity:   . Days of Exercise per Week: Not on file  . Minutes of Exercise per Session: Not on file  Stress:   . Feeling of Stress : Not on file  Social Connections:   . Frequency of Communication with Friends and Family: Not on file  . Frequency of Social Gatherings with Friends and Family: Not on file  . Attends Religious Services: Not on file  . Active Member of Clubs or Organizations: Not on file  . Attends Archivist Meetings: Not on file  . Marital Status: Not on file  Intimate Partner Violence:   . Fear of Current or Ex-Partner: Not on file  . Emotionally Abused: Not on file  . Physically Abused: Not on file  . Sexually Abused: Not on file     BP 106/68   Pulse (!) 51   Ht 5\' 3"  (1.6 m)   Wt 112 lb (50.8 kg)   SpO2 98%   BMI 19.84 kg/m   Physical Exam:  Well appearing 69 yo woman, NAD HEENT: Unremarkable Neck:  No JVD, no thyromegally Lymphatics:  No adenopathy Back:  No CVA tenderness Lungs:  Clear with no wheezes HEART:  Regular rate rhythm, no murmurs, no rubs, no clicks Abd:  soft, positive bowel sounds, no organomegally, no rebound, no guarding Ext:  2 plus pulses, no edema, no cyanosis, no clubbing Skin:  No rashes no nodules Neuro:  CN II through XII intact, motor grossly intact  EKG - sinus bradycardia with RAE   Assess/Plan: 1. Palpitations - we discussed the benign nature of her symptoms. I encouraged her to avoid caffeine and try and sleep more. 2. NS AT and PAC's - she will continue flecainide. I discussed other AA drugs.  3. Sinus node dysfunction - her HR is slow with her low dose toprol but she is asymptomatic.   Carleene Overlie Marta Bouie,MD

## 2020-07-15 NOTE — Patient Instructions (Addendum)
Medication Instructions:  Your physician recommends that you continue on your current medications as directed. Please refer to the Current Medication list given to you today.  Labwork: None ordered.  Testing/Procedures: None ordered.  Follow-Up: Your physician wants you to follow-up in: as needed with Dr. Taylor.      Any Other Special Instructions Will Be Listed Below (If Applicable).  If you need a refill on your cardiac medications before your next appointment, please call your pharmacy.   

## 2020-07-18 ENCOUNTER — Other Ambulatory Visit: Payer: Self-pay | Admitting: Cardiology

## 2020-07-18 DIAGNOSIS — I493 Ventricular premature depolarization: Secondary | ICD-10-CM

## 2020-07-23 IMAGING — US US ABDOMEN COMPLETE
2 series · 13 of 25 positions shown · non-contrast
Comparison: None available

CLINICAL DATA: Esophageal  varices encountered on endoscopy

EXAM:
COMPLETE ABDOMINAL ULTRASOUND
US HEPATIC DOPPLER ARTERIAL/VENOUS ULTRASOUND

[Series 1: us abdomen complete · 11 of 109 slices shown (1 of 2)]
[im 1/109]
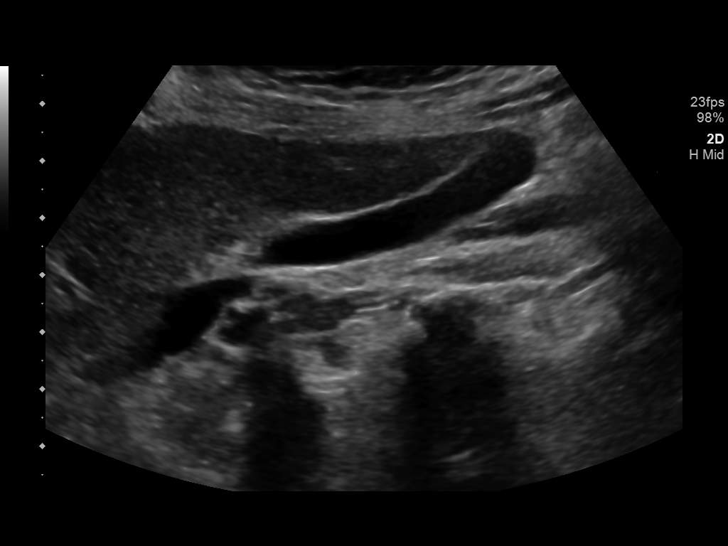
[im 11/109]
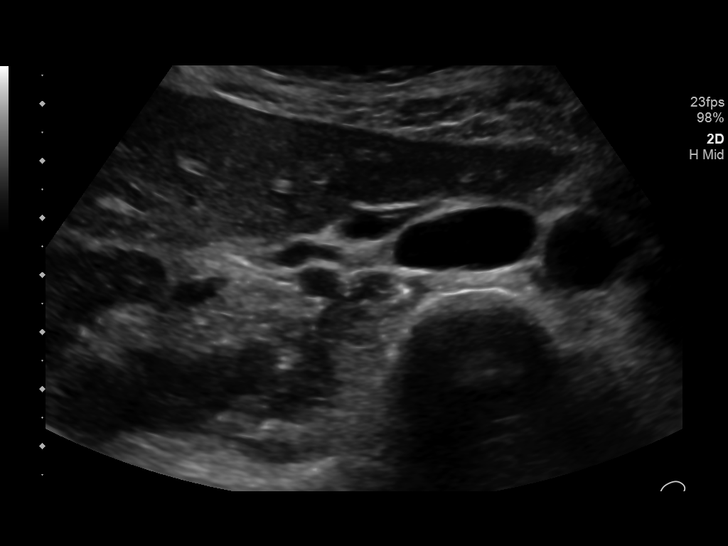
[im 22/109]
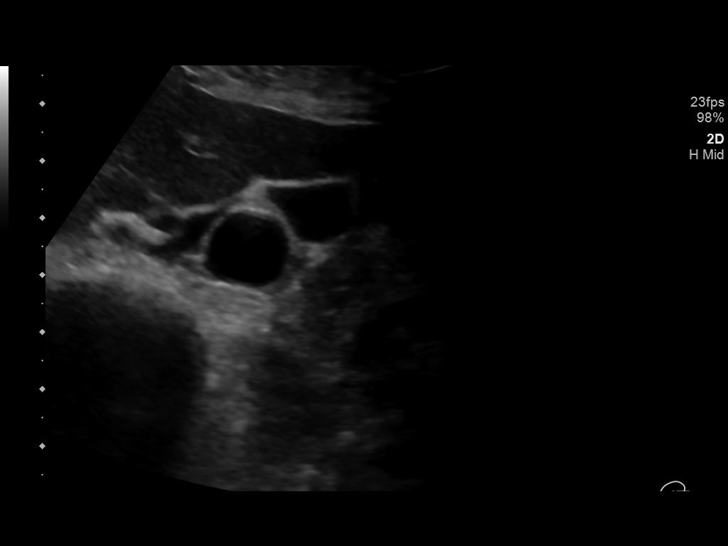
[im 33/109]
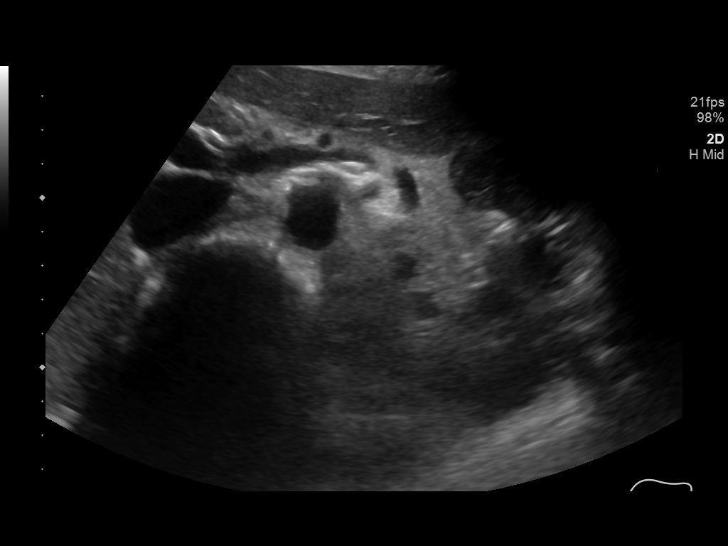
[im 44/109]
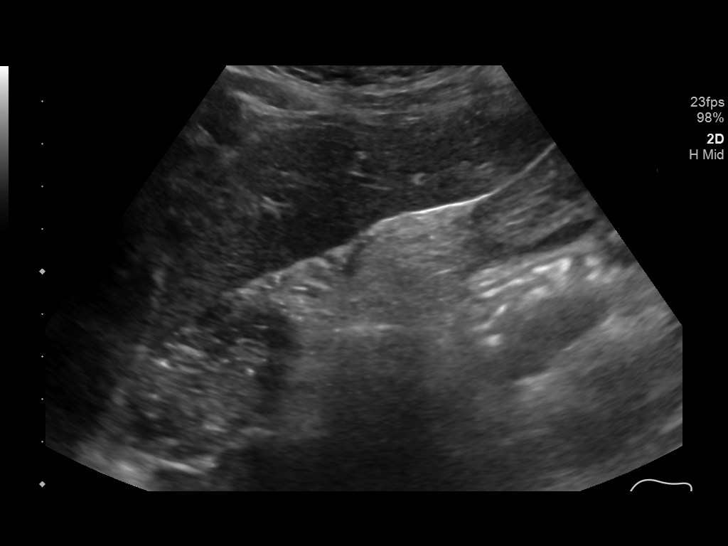
[im 55/109]
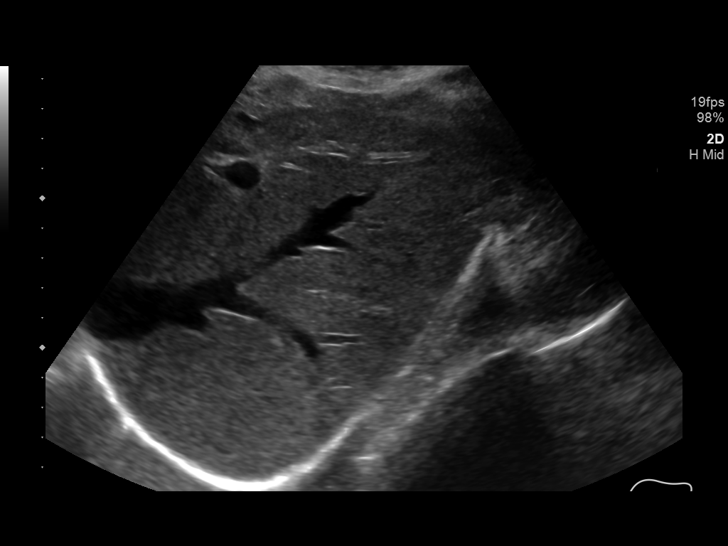
[im 65/109]
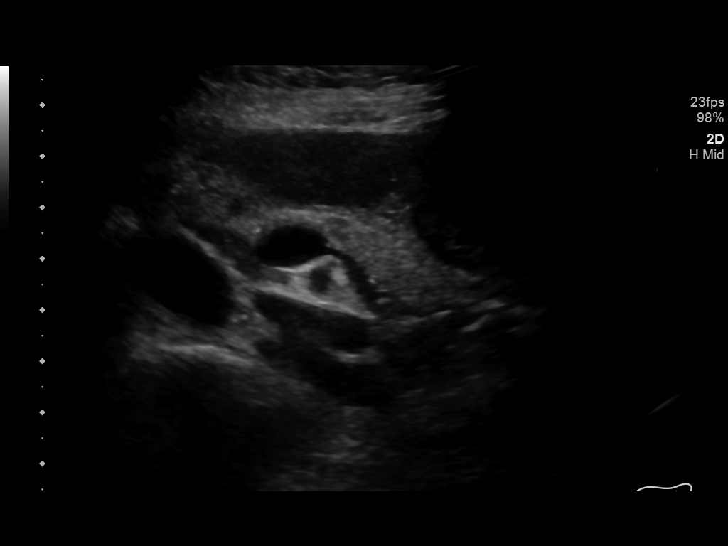
[im 76/109]
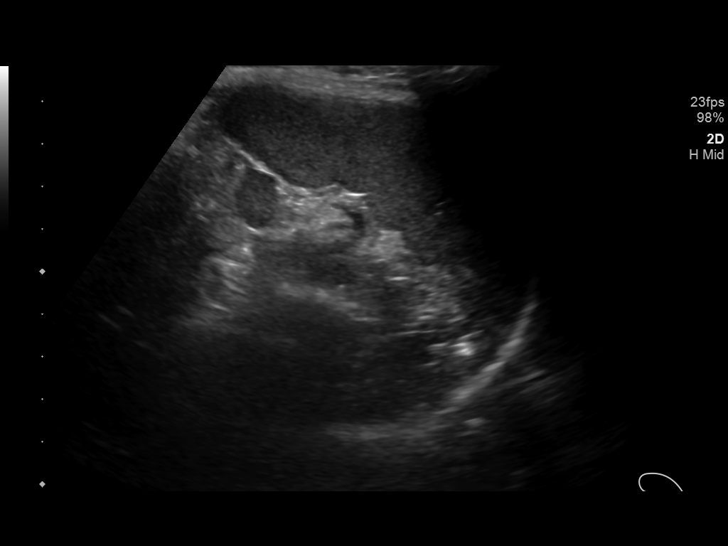
[im 87/109]
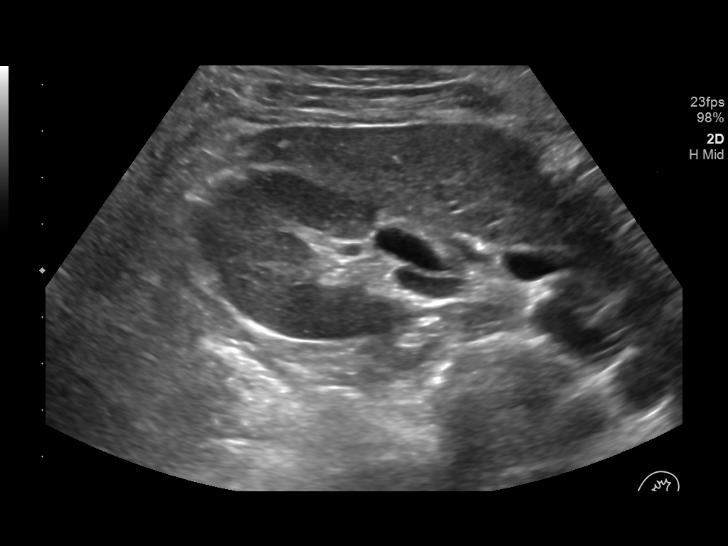
[im 98/109]
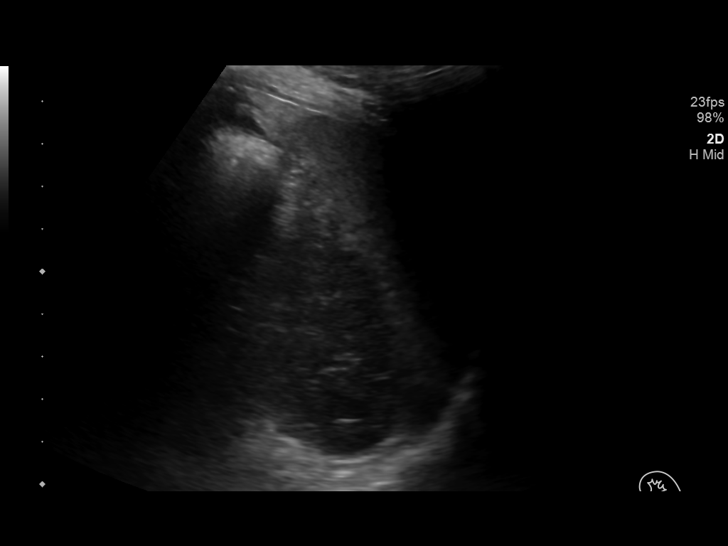
[im 109/109]
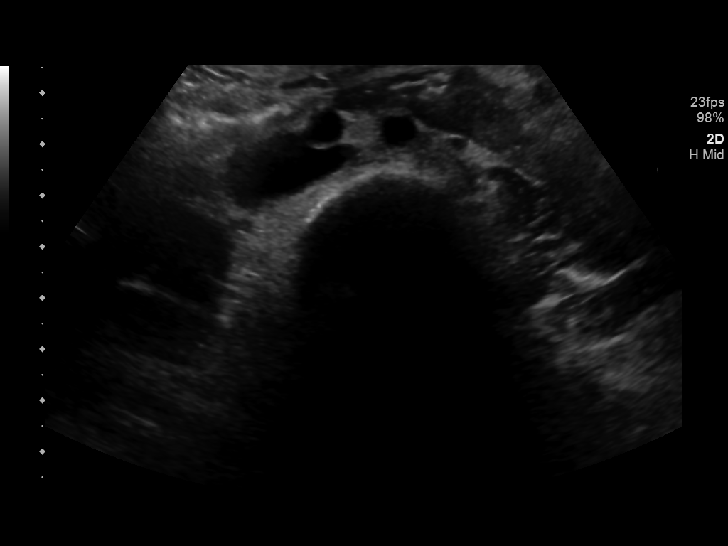

[Series 2: us abdomen complete · 2 of 23 slices shown (2 of 2)]
[im 8/23]
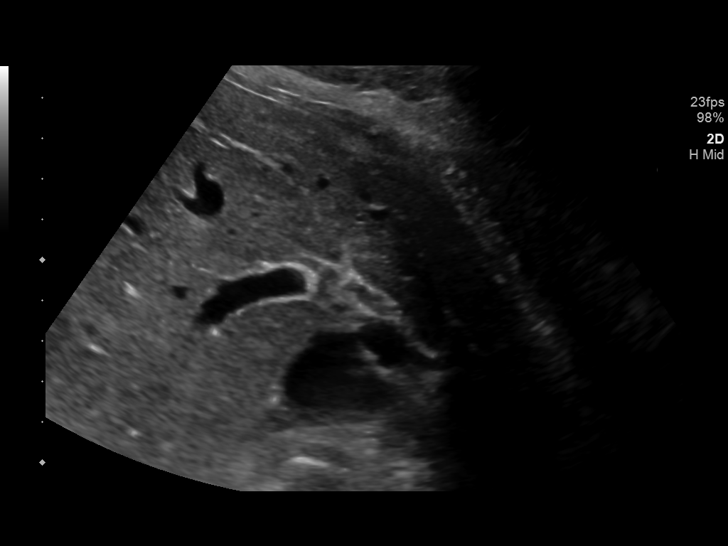
[im 23/23]
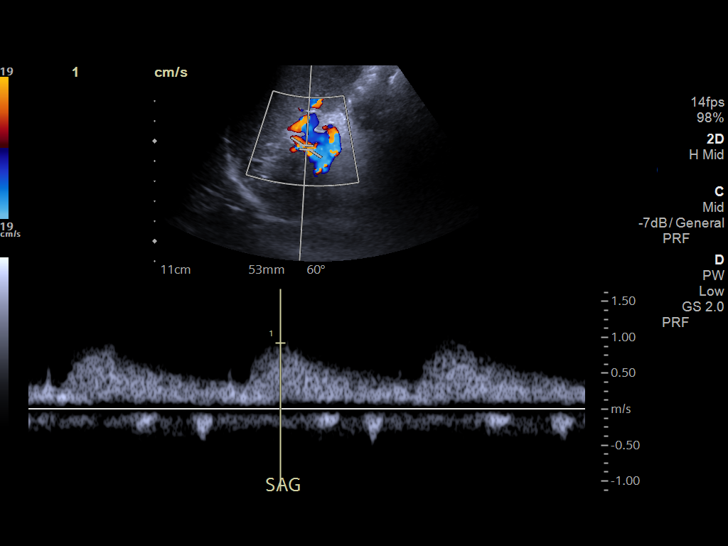

[13 of 25 positions shown; findings below may reference images not displayed]

FINDINGS: Gallbladder: Physiologically distended without stones, wall
thickening, or pericholecystic fluid. Sonographer reports no
sonographic Murphy's sign.

Common bile duct:  Normal in caliber, 3.4mm diameter.

Liver: Homogeneous in echotexture without focal lesion or
intrahepatic bile duct dilatation.

Portal Vein 1.2 cm diameter. No occlusion or thrombus. Velocities
(all hepatopetal):

Main:  43-58 cm/sec

Right: 27 cm/sec

Left:  17 cm/sec

Hepatic Vein Velocities (all hepatofugal):

Right:  25 cm/sec

Middle:  30 cm/sec

Left:  39 cm/sec

Hepatic Artery Velocity: 147 cm/sec

IVC:  Patent, 2.3 cm diameter intrahepatic

Pancreas:  Negative

Spleen: No focal lesion, 7 x 2.5 x 8.1 cm (volume = 70 cm^3).
cm probable accessory splenule incidentally noted.

Splenic Vein: No occlusion or thrombus; velocity: 40 cm/sec

Right Kidney:  No mass or hydronephrosis, 9.9cm in length.

Left Kidney:  No lesion or hydronephrosis, 10.6cm in length.

Abdominal aorta:  Negative

Varices: None identified

Ascites: None seen
IMPRESSION: 1. Normal hepatic vascular Doppler evaluation.
2. Unremarkable ultrasound abdomen. No stigmata of cirrhosis or
portal venous hypertension.

## 2020-08-04 ENCOUNTER — Ambulatory Visit (INDEPENDENT_AMBULATORY_CARE_PROVIDER_SITE_OTHER): Payer: Medicare Other | Admitting: Psychology

## 2020-08-04 DIAGNOSIS — F4321 Adjustment disorder with depressed mood: Secondary | ICD-10-CM | POA: Diagnosis not present

## 2020-08-11 ENCOUNTER — Other Ambulatory Visit: Payer: Self-pay | Admitting: Cardiology

## 2020-08-11 DIAGNOSIS — I341 Nonrheumatic mitral (valve) prolapse: Secondary | ICD-10-CM

## 2020-08-11 DIAGNOSIS — R002 Palpitations: Secondary | ICD-10-CM

## 2020-08-18 ENCOUNTER — Ambulatory Visit (INDEPENDENT_AMBULATORY_CARE_PROVIDER_SITE_OTHER): Payer: Medicare Other | Admitting: Psychology

## 2020-08-18 DIAGNOSIS — F4321 Adjustment disorder with depressed mood: Secondary | ICD-10-CM

## 2020-09-08 ENCOUNTER — Ambulatory Visit (INDEPENDENT_AMBULATORY_CARE_PROVIDER_SITE_OTHER): Payer: Medicare Other | Admitting: Psychology

## 2020-09-08 DIAGNOSIS — F4321 Adjustment disorder with depressed mood: Secondary | ICD-10-CM

## 2020-10-02 ENCOUNTER — Ambulatory Visit (INDEPENDENT_AMBULATORY_CARE_PROVIDER_SITE_OTHER): Payer: Medicare Other | Admitting: Psychology

## 2020-10-02 DIAGNOSIS — F4321 Adjustment disorder with depressed mood: Secondary | ICD-10-CM

## 2020-10-15 ENCOUNTER — Ambulatory Visit: Payer: Medicare Other | Admitting: Psychology

## 2020-10-16 ENCOUNTER — Ambulatory Visit: Payer: Medicare Other | Admitting: Psychology

## 2020-10-24 DIAGNOSIS — Z23 Encounter for immunization: Secondary | ICD-10-CM | POA: Diagnosis not present

## 2020-10-30 ENCOUNTER — Ambulatory Visit: Payer: Medicare Other | Admitting: Cardiology

## 2020-10-30 ENCOUNTER — Ambulatory Visit: Payer: Medicare Other | Admitting: Psychology

## 2020-11-13 ENCOUNTER — Other Ambulatory Visit: Payer: Self-pay | Admitting: Cardiology

## 2020-11-13 DIAGNOSIS — R002 Palpitations: Secondary | ICD-10-CM

## 2020-11-13 DIAGNOSIS — I341 Nonrheumatic mitral (valve) prolapse: Secondary | ICD-10-CM

## 2020-11-15 DIAGNOSIS — Z20822 Contact with and (suspected) exposure to covid-19: Secondary | ICD-10-CM | POA: Diagnosis not present

## 2020-11-19 ENCOUNTER — Ambulatory Visit: Payer: Medicare Other | Admitting: Psychology

## 2020-11-27 ENCOUNTER — Ambulatory Visit (INDEPENDENT_AMBULATORY_CARE_PROVIDER_SITE_OTHER): Payer: Medicare Other | Admitting: Psychology

## 2020-11-27 DIAGNOSIS — F4321 Adjustment disorder with depressed mood: Secondary | ICD-10-CM

## 2020-12-10 ENCOUNTER — Ambulatory Visit: Payer: Medicare Other | Admitting: Cardiology

## 2020-12-31 ENCOUNTER — Ambulatory Visit (INDEPENDENT_AMBULATORY_CARE_PROVIDER_SITE_OTHER): Payer: Medicare Other | Admitting: Psychology

## 2020-12-31 DIAGNOSIS — F4321 Adjustment disorder with depressed mood: Secondary | ICD-10-CM | POA: Diagnosis not present

## 2021-01-15 ENCOUNTER — Ambulatory Visit (INDEPENDENT_AMBULATORY_CARE_PROVIDER_SITE_OTHER): Payer: Medicare Other | Admitting: Psychology

## 2021-01-15 DIAGNOSIS — F4321 Adjustment disorder with depressed mood: Secondary | ICD-10-CM

## 2021-01-16 ENCOUNTER — Ambulatory Visit: Payer: Medicare Other | Admitting: Family Medicine

## 2021-01-16 DIAGNOSIS — J019 Acute sinusitis, unspecified: Secondary | ICD-10-CM | POA: Diagnosis not present

## 2021-01-19 ENCOUNTER — Ambulatory Visit: Payer: Medicare Other

## 2021-01-29 ENCOUNTER — Ambulatory Visit: Payer: Medicare Other | Admitting: Psychology

## 2021-02-04 ENCOUNTER — Ambulatory Visit (INDEPENDENT_AMBULATORY_CARE_PROVIDER_SITE_OTHER): Payer: Medicare Other | Admitting: Psychology

## 2021-02-04 DIAGNOSIS — F4321 Adjustment disorder with depressed mood: Secondary | ICD-10-CM

## 2021-02-26 ENCOUNTER — Ambulatory Visit: Payer: Medicare Other | Admitting: Psychology

## 2021-03-06 ENCOUNTER — Ambulatory Visit: Payer: Medicare Other | Admitting: Psychology

## 2021-03-16 ENCOUNTER — Ambulatory Visit: Payer: Medicare Other | Admitting: Psychology

## 2021-03-27 DIAGNOSIS — Z23 Encounter for immunization: Secondary | ICD-10-CM | POA: Diagnosis not present

## 2021-03-30 ENCOUNTER — Ambulatory Visit (INDEPENDENT_AMBULATORY_CARE_PROVIDER_SITE_OTHER): Payer: Medicare Other | Admitting: Psychology

## 2021-03-30 DIAGNOSIS — F4321 Adjustment disorder with depressed mood: Secondary | ICD-10-CM

## 2021-04-02 ENCOUNTER — Ambulatory Visit: Payer: Medicare Other | Admitting: Cardiology

## 2021-04-06 ENCOUNTER — Ambulatory Visit: Payer: Medicare Other | Admitting: Psychology

## 2021-04-08 DIAGNOSIS — M8588 Other specified disorders of bone density and structure, other site: Secondary | ICD-10-CM | POA: Diagnosis not present

## 2021-04-08 DIAGNOSIS — Z01419 Encounter for gynecological examination (general) (routine) without abnormal findings: Secondary | ICD-10-CM | POA: Diagnosis not present

## 2021-04-08 DIAGNOSIS — Z7689 Persons encountering health services in other specified circumstances: Secondary | ICD-10-CM | POA: Diagnosis not present

## 2021-04-08 DIAGNOSIS — Z1231 Encounter for screening mammogram for malignant neoplasm of breast: Secondary | ICD-10-CM | POA: Diagnosis not present

## 2021-04-08 DIAGNOSIS — R102 Pelvic and perineal pain: Secondary | ICD-10-CM | POA: Diagnosis not present

## 2021-04-08 DIAGNOSIS — N958 Other specified menopausal and perimenopausal disorders: Secondary | ICD-10-CM | POA: Diagnosis not present

## 2021-04-08 DIAGNOSIS — Z681 Body mass index (BMI) 19 or less, adult: Secondary | ICD-10-CM | POA: Diagnosis not present

## 2021-04-10 ENCOUNTER — Ambulatory Visit: Payer: Medicare Other | Admitting: Cardiology

## 2021-04-15 ENCOUNTER — Ambulatory Visit (INDEPENDENT_AMBULATORY_CARE_PROVIDER_SITE_OTHER): Payer: Medicare Other | Admitting: Psychology

## 2021-04-15 DIAGNOSIS — F4323 Adjustment disorder with mixed anxiety and depressed mood: Secondary | ICD-10-CM

## 2021-04-20 ENCOUNTER — Ambulatory Visit (INDEPENDENT_AMBULATORY_CARE_PROVIDER_SITE_OTHER): Payer: Medicare Other | Admitting: Psychology

## 2021-04-20 ENCOUNTER — Ambulatory Visit: Payer: Medicare Other

## 2021-04-20 DIAGNOSIS — F4323 Adjustment disorder with mixed anxiety and depressed mood: Secondary | ICD-10-CM | POA: Diagnosis not present

## 2021-04-21 ENCOUNTER — Other Ambulatory Visit: Payer: Self-pay | Admitting: *Deleted

## 2021-04-21 DIAGNOSIS — I493 Ventricular premature depolarization: Secondary | ICD-10-CM

## 2021-04-21 DIAGNOSIS — I341 Nonrheumatic mitral (valve) prolapse: Secondary | ICD-10-CM

## 2021-04-21 DIAGNOSIS — R002 Palpitations: Secondary | ICD-10-CM

## 2021-04-21 MED ORDER — FLECAINIDE ACETATE 100 MG PO TABS: ORAL_TABLET | ORAL | 0 refills | Status: DC

## 2021-04-21 MED ORDER — METOPROLOL SUCCINATE ER 25 MG PO TB24: 1.0000 | ORAL_TABLET | Freq: Every day | ORAL | 0 refills | Status: DC

## 2021-04-27 DIAGNOSIS — M8580 Other specified disorders of bone density and structure, unspecified site: Secondary | ICD-10-CM | POA: Diagnosis not present

## 2021-04-28 DIAGNOSIS — R102 Pelvic and perineal pain: Secondary | ICD-10-CM | POA: Diagnosis not present

## 2021-05-04 ENCOUNTER — Ambulatory Visit: Payer: Medicare Other

## 2021-05-04 ENCOUNTER — Ambulatory Visit: Payer: Medicare Other | Admitting: Psychology

## 2021-05-11 ENCOUNTER — Ambulatory Visit (INDEPENDENT_AMBULATORY_CARE_PROVIDER_SITE_OTHER): Payer: Medicare Other | Admitting: Psychology

## 2021-05-11 ENCOUNTER — Other Ambulatory Visit: Payer: Self-pay

## 2021-05-11 ENCOUNTER — Ambulatory Visit (INDEPENDENT_AMBULATORY_CARE_PROVIDER_SITE_OTHER): Payer: Medicare Other

## 2021-05-11 ENCOUNTER — Ambulatory Visit: Payer: Medicare Other

## 2021-05-11 VITALS — BP 90/54 | HR 56 | Temp 97.0°F | Wt 109.4 lb

## 2021-05-11 DIAGNOSIS — F4321 Adjustment disorder with depressed mood: Secondary | ICD-10-CM | POA: Diagnosis not present

## 2021-05-11 DIAGNOSIS — Z0000 Encounter for general adult medical examination without abnormal findings: Secondary | ICD-10-CM

## 2021-05-11 NOTE — Patient Instructions (Signed)
Ms. Fischbach , Thank you for taking time to come for your Medicare Wellness Visit. I appreciate your ongoing commitment to your health goals. Please review the following plan we discussed and let me know if I can assist you in the future.   Screening recommendations/referrals: Colonoscopy: Done 11/24/18 Mammogram: Done 05/25/18 Bone Density: Done 08/09/18 Recommended yearly ophthalmology/optometry visit for glaucoma screening and checkup Recommended yearly dental visit for hygiene and checkup  Vaccinations: Influenza vaccine: Due 06/15/21 Pneumococcal vaccine: Due and discussed  Tdap vaccine: Done 04/25/15 repeat 04/24/25 Shingles vaccine: Shingrix discussed. Please contact your pharmacy for coverage information.    Covid-19:Completed 2/7, 3/4, 08/13/20 & 03/27/21  Advanced directives: Please bring a copy of your health care power of attorney and living will to the office at your convenience.  Conditions/risks identified: Stay Healthy  Next appointment: Follow up in one year for your annual wellness visit    Preventive Care 65 Years and Older, Female Preventive care refers to lifestyle choices and visits with your health care provider that can promote health and wellness. What does preventive care include? A yearly physical exam. This is also called an annual well check. Dental exams once or twice a year. Routine eye exams. Ask your health care provider how often you should have your eyes checked. Personal lifestyle choices, including: Daily care of your teeth and gums. Regular physical activity. Eating a healthy diet. Avoiding tobacco and drug use. Limiting alcohol use. Practicing safe sex. Taking low-dose aspirin every day. Taking vitamin and mineral supplements as recommended by your health care provider. What happens during an annual well check? The services and screenings done by your health care provider during your annual well check will depend on your age, overall health,  lifestyle risk factors, and family history of disease. Counseling  Your health care provider may ask you questions about your: Alcohol use. Tobacco use. Drug use. Emotional well-being. Home and relationship well-being. Sexual activity. Eating habits. History of falls. Memory and ability to understand (cognition). Work and work Statistician. Reproductive health. Screening  You may have the following tests or measurements: Height, weight, and BMI. Blood pressure. Lipid and cholesterol levels. These may be checked every 5 years, or more frequently if you are over 75 years old. Skin check. Lung cancer screening. You may have this screening every year starting at age 10 if you have a 30-pack-year history of smoking and currently smoke or have quit within the past 15 years. Fecal occult blood test (FOBT) of the stool. You may have this test every year starting at age 54. Flexible sigmoidoscopy or colonoscopy. You may have a sigmoidoscopy every 5 years or a colonoscopy every 10 years starting at age 63. Hepatitis C blood test. Hepatitis B blood test. Sexually transmitted disease (STD) testing. Diabetes screening. This is done by checking your blood sugar (glucose) after you have not eaten for a while (fasting). You may have this done every 1-3 years. Bone density scan. This is done to screen for osteoporosis. You may have this done starting at age 84. Mammogram. This may be done every 1-2 years. Talk to your health care provider about how often you should have regular mammograms. Talk with your health care provider about your test results, treatment options, and if necessary, the need for more tests. Vaccines  Your health care provider may recommend certain vaccines, such as: Influenza vaccine. This is recommended every year. Tetanus, diphtheria, and acellular pertussis (Tdap, Td) vaccine. You may need a Td booster every 10 years.  Zoster vaccine. You may need this after age  32. Pneumococcal 13-valent conjugate (PCV13) vaccine. One dose is recommended after age 71. Pneumococcal polysaccharide (PPSV23) vaccine. One dose is recommended after age 57. Talk to your health care provider about which screenings and vaccines you need and how often you need them. This information is not intended to replace advice given to you by your health care provider. Make sure you discuss any questions you have with your health care provider. Document Released: 11/28/2015 Document Revised: 07/21/2016 Document Reviewed: 09/02/2015 Elsevier Interactive Patient Education  2017 [place] Prevention in the Home Falls can cause injuries. They can happen to people of all ages. There are many things you can do to make your home safe and to help prevent falls. What can I do on the outside of my home? Regularly fix the edges of walkways and driveways and fix any cracks. Remove anything that might make you trip as you walk through a door, such as a raised step or threshold. Trim any bushes or trees on the path to your home. Use bright outdoor lighting. Clear any walking paths of anything that might make someone trip, such as rocks or tools. Regularly check to see if handrails are loose or broken. Make sure that both sides of any steps have handrails. Any raised decks and porches should have guardrails on the edges. Have any leaves, snow, or ice cleared regularly. Use sand or salt on walking paths during winter. Clean up any spills in your garage right away. This includes oil or grease spills. What can I do in the bathroom? Use night lights. Install grab bars by the toilet and in the tub and shower. Do not use towel bars as grab bars. Use non-skid mats or decals in the tub or shower. If you need to sit down in the shower, use a plastic, non-slip stool. Keep the floor dry. Clean up any water that spills on the floor as soon as it happens. Remove soap buildup in the tub or shower  regularly. Attach bath mats securely with double-sided non-slip rug tape. Do not have throw rugs and other things on the floor that can make you trip. What can I do in the bedroom? Use night lights. Make sure that you have a light by your bed that is easy to reach. Do not use any sheets or blankets that are too big for your bed. They should not hang down onto the floor. Have a firm chair that has side arms. You can use this for support while you get dressed. Do not have throw rugs and other things on the floor that can make you trip. What can I do in the kitchen? Clean up any spills right away. Avoid walking on wet floors. Keep items that you use a lot in easy-to-reach places. If you need to reach something above you, use a strong step stool that has a grab bar. Keep electrical cords out of the way. Do not use floor polish or wax that makes floors slippery. If you must use wax, use non-skid floor wax. Do not have throw rugs and other things on the floor that can make you trip. What can I do with my stairs? Do not leave any items on the stairs. Make sure that there are handrails on both sides of the stairs and use them. Fix handrails that are broken or loose. Make sure that handrails are as long as the stairways. Check any carpeting to make sure that  it is firmly attached to the stairs. Fix any carpet that is loose or worn. Avoid having throw rugs at the top or bottom of the stairs. If you do have throw rugs, attach them to the floor with carpet tape. Make sure that you have a light switch at the top of the stairs and the bottom of the stairs. If you do not have them, ask someone to add them for you. What else can I do to help prevent falls? Wear shoes that: Do not have high heels. Have rubber bottoms. Are comfortable and fit you well. Are closed at the toe. Do not wear sandals. If you use a stepladder: Make sure that it is fully opened. Do not climb a closed stepladder. Make sure that  both sides of the stepladder are locked into place. Ask someone to hold it for you, if possible. Clearly mark and make sure that you can see: Any grab bars or handrails. First and last steps. Where the edge of each step is. Use tools that help you move around (mobility aids) if they are needed. These include: Canes. Walkers. Scooters. Crutches. Turn on the lights when you go into a dark area. Replace any light bulbs as soon as they burn out. Set up your furniture so you have a clear path. Avoid moving your furniture around. If any of your floors are uneven, fix them. If there are any pets around you, be aware of where they are. Review your medicines with your doctor. Some medicines can make you feel dizzy. This can increase your chance of falling. Ask your doctor what other things that you can do to help prevent falls. This information is not intended to replace advice given to you by your health care provider. Make sure you discuss any questions you have with your health care provider. Document Released: 08/28/2009 Document Revised: 04/08/2016 Document Reviewed: 12/06/2014 Elsevier Interactive Patient Education  2017 [employer].

## 2021-05-11 NOTE — Progress Notes (Addendum)
Subjective:   QUANTAVIA FRITH is a 70 y.o. female who presents for Medicare Annual (Subsequent) preventive examination.  Review of Systems     Cardiac Risk Factors include: advanced age (>38men, >65 women)     Objective:    Today's Vitals   05/11/21 0927  BP: (!) 90/54  Pulse: (!) 56  Temp: (!) 97 F (36.1 C)  SpO2: 96%  Weight: 109 lb 6.4 oz (49.6 kg)  PainSc: 4    Body mass index is 19.38 kg/m.  Advanced Directives 05/11/2021 12/21/2019 08/08/2014  Does Patient Have a Medical Advance Directive? Yes Yes Yes  Type of Advance Directive Living will Living will;Healthcare Power of [place]  Does patient want to make changes to medical advance directive? - No - Patient declined -  Copy of [place] in Chart? - No - copy requested -    Current Medications (verified) Outpatient Encounter Medications as of 05/11/2021  Medication Sig   ACETAMINOPHEN PO Take by mouth as needed.   Cholecalciferol (VITAMIN D3) 50 MCG (2000 UT) CAPS Take by mouth.   flecainide (TAMBOCOR) 100 MG tablet TAKE 1 TABLET BY MOUTH 2 TIMES DAILY. PLEASE KEEP UPCOMING APPT IN JUNE   guaiFENesin (MUCINEX) 600 MG 12 hr tablet Take 600 mg by mouth 2 (two) times daily. As needed for allergies   Ibuprofen 200 MG CAPS Take 400 mg by mouth in the morning and at bedtime.    metoprolol succinate (TOPROL-XL) 25 MG 24 hr tablet Take 1 tablet (25 mg total) by mouth daily.   [DISCONTINUED] fluticasone (FLONASE) 50 MCG/ACT nasal spray Place 1 spray into the nose daily.   No facility-administered encounter medications on file as of 05/11/2021.    Allergies (verified) Cefaclor, Penicillins, and Sulfonamide derivatives   History: Past Medical History:  Diagnosis Date   Allergy    Anxiety    Arthritis    Diverticulosis    Frequent PVCs    and PAC per pt/has had along time   GERD (gastroesophageal reflux disease)    Heart murmur    Hx of echocardiogram    a.  Echo 12/13:  EF 55-60%, mild MR, normal diast fxn, no MVP   IBS (irritable bowel syndrome)    MVP (mitral valve prolapse)    Osteoporosis    Palpitations    Polymyalgia rheumatica (HCC)    Rectal pain    Spinal stenosis    Past Surgical History:  Procedure Laterality Date   COLONOSCOPY     DILATION AND CURETTAGE OF UTERUS     TONSILLECTOMY     TUBAL LIGATION     Family History  Problem Relation Age of Onset   Sleep apnea Mother    Diabetes Father        in 55s   Kidney disease Father        DM related, renal failure   Heart disease Father        CVA young age, Smoker, overweight   Colon polyps Father    Esophageal cancer Neg Hx    Rectal cancer Neg Hx    Stomach cancer Neg Hx    Colon cancer Neg Hx    Social History   Socioeconomic History   Marital status: Widowed    Spouse name: Not on file   Number of children: 2   Years of education: Not on file   Highest education level: Not on file  Occupational History  Occupation: Pharmacist, hospital    Comment: Special Education   Tobacco Use   Smoking status: Never   Smokeless tobacco: Never  Substance and Sexual Activity   Alcohol use: No   Drug use: No   Sexual activity: Not on file  Other Topics Concern   Not on file  Social History Narrative   Widowed in 02-04-2013 (husband sudden death MI despite being in great shape). 2 daughters. Oldest daughter pregnant in 09/2014-Phd student at [employer] in early [place]. Younger daughter [name] grad student [employer] [place] in [place].       [employer]. Both she and her husband. Husband went to [employer] for lawschool. 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, regular walking, reading, dogs (2) 6 horses    Social Determinants of Radio broadcast assistant Strain: Low Risk    Difficulty of Paying Living Expenses: Not hard at all  Food  Insecurity: No Food Insecurity   Worried About Charity fundraiser in the Last Year: Never true   Arboriculturist in the Last Year: Never true  Transportation Needs: No Transportation Needs   Lack of Transportation (Medical): No   Lack of Transportation (Non-Medical): No  Physical Activity: Sufficiently Active   Days of Exercise per Week: 7 days   Minutes of Exercise per Session: 40 min  Stress: No Stress Concern Present   Feeling of Stress : Not at all  Social Connections: Socially Isolated   Frequency of Communication with Friends and Family: More than three times a week   Frequency of Social Gatherings with Friends and Family: More than three times a week   Attends Religious Services: Never   Marine scientist or Organizations: No   Attends Archivist Meetings: Never   Marital Status: Widowed    Tobacco Counseling Counseling given: Not Answered   Clinical Intake:  Pre-visit preparation completed: Yes  Pain : 0-10 Pain Score: 4  Pain Type: Chronic pain Pain Location: Back Pain Orientation: Lower Pain Descriptors / Indicators: Sharp (with movement) Pain Onset: More than a month ago Pain Frequency: Constant     BMI - recorded: 19.38 Nutritional Status: BMI of 19-24  Normal Nutritional Risks: None Diabetes: No  How often do you need to have someone help you when you read instructions, pamphlets, or other written materials from your doctor or pharmacy?: 1 - Never  Diabetic?no  Interpreter Needed?: No  Information entered by :: Charlott Rakes, LPN   Activities of Daily Living In your present state of health, do you have any difficulty performing the following activities: 05/11/2021  Hearing? N  Vision? N  Difficulty concentrating or making decisions? N  Walking or climbing stairs? N  Dressing or bathing? N  Doing errands, shopping? N  Preparing Food and eating ? N  Using the Toilet? N  In the past six months, have you accidently leaked urine?  Y  Comment at night  Do you have problems with loss of bowel control? N  Managing your Medications? N  Managing your Finances? N  Housekeeping or managing your Housekeeping? N  Some recent data might be hidden    Patient Care Team: [name], MD as PCP - General (Family Medicine) Dorothy Spark, MD (Inactive) as PCP - Cardiology (Cardiology) Jovita Gamma, MD as Consulting Physician (Neurosurgery) Jolene Provost, PA-C  as Librarian, academic (Otolaryngology) Dian Queen, MD as Consulting Physician (Obstetrics and Gynecology) Mauri Pole, MD as Consulting Physician (Gastroenterology)  Indicate any recent Medical Services you may have received from other than [facility] providers in the past year (date may be approximate).     Assessment:   This is a routine wellness examination for Rylan.  Hearing/Vision screen Hearing Screening - Comments:: Pt denies any hearing issues Vision Screening - Comments:: Pt follow up with [facility] annually for eye exams   Dietary issues and exercise activities discussed: Current Exercise Habits: Home exercise routine, Type of exercise: walking, Time (Minutes): > 60, Frequency (Times/Week): 7, Weekly Exercise (Minutes/Week): 0   Goals Addressed             This Visit's Progress    Patient Stated       Stay healthy        Depression Screen PHQ 2/9 Scores 05/11/2021 12/21/2019 08/21/2019 03/23/2019  PHQ - 2 Score 0 0 0 0    Fall Risk Fall Risk  05/11/2021 12/21/2019 05/04/2019  Falls in the past year? 0 0 0  Number falls in past yr: 0 0 -  Injury with Fall? 0 0 -  Risk for fall due to : Impaired vision - -  Follow up Falls prevention discussed Education provided;Falls prevention discussed;Falls evaluation completed -    FALL RISK PREVENTION PERTAINING TO THE HOME:  Any stairs in or around the home? Yes  If so, are there any without handrails? No  Home free of loose throw rugs in walkways, pet beds,  electrical cords, etc? Yes  Adequate lighting in your home to reduce risk of falls? Yes   ASSISTIVE DEVICES UTILIZED TO PREVENT FALLS:  Life alert? No  Use of a cane, walker or w/c? No  Grab bars in the bathroom? Yes  Shower chair or bench in shower? No  Elevated toilet seat or a handicapped toilet? No   TIMED UP AND GO:  Was the test performed? Yes .  Length of time to ambulate 10 feet: 10 sec.   Gait steady and fast without use of assistive device  Cognitive Function:     6CIT Screen 05/11/2021 12/21/2019  What Year? 0 points 0 points  What month? 0 points 0 points  What time? 0 points 0 points  Count back from 20 0 points 0 points  Months in reverse 0 points 0 points  Repeat phrase 0 points 0 points  Total Score 0 0    Immunizations Immunization History  Administered Date(s) Administered   Fluad Quad(high Dose 65+) 08/21/2019, 10/24/2020   Influenza Inj Mdck Quad With Preservative 11/15/2018   Influenza Split 11/25/2011   Influenza,inj,Quad PF,6+ Mos 08/28/2013, 09/10/2014, 08/05/2016, 09/22/2017   PFIZER(Purple Top)SARS-COV-2 Vaccination 12/23/2019, 01/17/2020   Pneumococcal Conjugate-13 08/21/2019   Td 03/21/2008   Tdap 03/21/2008, 04/25/2015   Zoster, Live 09/07/2013    TDAP status: Up to date  Flu Vaccine status: Up to date  Pneumococcal vaccine status: Due, Education has been provided regarding the importance of this vaccine. Advised may receive this vaccine at local pharmacy or Health Dept. Aware to provide a copy of the vaccination record if obtained from local pharmacy or Health Dept. Verbalized acceptance and understanding.  Covid-19 vaccine status: Completed vaccines  Qualifies for Shingles Vaccine? Yes   Zostavax completed Yes   Shingrix Completed?: No.    Education has been provided regarding the importance of this vaccine. Patient has been advised to call insurance company to  determine out of pocket expense if they have not yet received this  vaccine. Advised may also receive vaccine at local pharmacy or Health Dept. Verbalized acceptance and understanding.  Screening Tests Health Maintenance  Topic Date Due   Zoster Vaccines- Shingrix (1 of 2) Never done   COVID-19 Vaccine (3 - Pfizer risk series) 02/14/2020   PNA vac Low Risk Adult (2 of 2 - PPSV23) 08/20/2020   INFLUENZA VACCINE  06/15/2021   MAMMOGRAM  03/16/2023   TETANUS/TDAP  04/24/2025   COLONOSCOPY (Pts 45-22yrs Insurance coverage will need to be confirmed)  11/24/2028   DEXA SCAN  Completed   Hepatitis C Screening  Completed   HPV VACCINES  Aged Out    Health Maintenance  Health Maintenance Due  Topic Date Due   Zoster Vaccines- Shingrix (1 of 2) Never done   COVID-19 Vaccine (3 - Pfizer risk series) 02/14/2020   PNA vac Low Risk Adult (2 of 2 - PPSV23) 08/20/2020    Colorectal cancer screening: Type of screening: Colonoscopy. Completed 11/24/18. Repeat every 10 years  Mammogram status: Completed 03/15/21. Repeat every year  Bone Density status: Completed 08/09/18. Results reflect: Bone density results: OSTEOPENIA. Repeat every 2 years.   Additional Screening:  Hepatitis C Screening:  Completed 07/07/18  Vision Screening: Recommended annual ophthalmology exams for early detection of glaucoma and other disorders of the eye. Is the patient up to date with their annual eye exam?  Yes  Who is the provider or what is the name of the office in which the patient attends annual eye exams? [facility]  If pt is not established with a provider, would they like to be referred to a provider to establish care? No .   Dental Screening: Recommended annual dental exams for proper oral hygiene  Community Resource Referral / Chronic Care Management: CRR required this visit?  No   CCM required this visit?  No      Plan:     I have personally reviewed and noted the following in the patient's chart:   Medical and social history Use of alcohol, tobacco or  illicit drugs  Current medications and supplements including opioid prescriptions.  Functional ability and status Nutritional status Physical activity Advanced directives List of other physicians Hospitalizations, surgeries, and ER visits in previous 12 months Vitals Screenings to include cognitive, depression, and falls Referrals and appointments  In addition, I have reviewed and discussed with patient certain preventive protocols, quality metrics, and best practice recommendations. A written personalized care plan for preventive services as well as general preventive health recommendations were provided to patient.     Willette Brace, LPN   05/10/9484   Nurse Notes: None

## 2021-05-13 ENCOUNTER — Ambulatory Visit: Payer: Medicare Other | Admitting: Physician Assistant

## 2021-05-13 ENCOUNTER — Other Ambulatory Visit: Payer: Self-pay

## 2021-05-13 DIAGNOSIS — R002 Palpitations: Secondary | ICD-10-CM

## 2021-05-13 DIAGNOSIS — I341 Nonrheumatic mitral (valve) prolapse: Secondary | ICD-10-CM

## 2021-05-13 MED ORDER — METOPROLOL SUCCINATE ER 25 MG PO TB24: 25.0000 mg | ORAL_TABLET | Freq: Every day | ORAL | 0 refills | Status: DC

## 2021-05-16 ENCOUNTER — Other Ambulatory Visit: Payer: Self-pay | Admitting: Cardiology

## 2021-05-16 DIAGNOSIS — I493 Ventricular premature depolarization: Secondary | ICD-10-CM

## 2021-05-19 ENCOUNTER — Telehealth: Payer: Self-pay

## 2021-05-19 DIAGNOSIS — I493 Ventricular premature depolarization: Secondary | ICD-10-CM

## 2021-05-19 MED ORDER — FLECAINIDE ACETATE 100 MG PO TABS: ORAL_TABLET | ORAL | 0 refills | Status: DC

## 2021-05-19 NOTE — Telephone Encounter (Signed)
Patient called and requested a refill on her Flecainide. She states she has a follow up appointment scheduled in October with Estella Husk, PA.   Patient requested a a ninety day supply.   Reviewed chart.   Rx(s) sent to pharmacy electronically.

## 2021-05-24 DIAGNOSIS — Z20822 Contact with and (suspected) exposure to covid-19: Secondary | ICD-10-CM | POA: Diagnosis not present

## 2021-05-25 ENCOUNTER — Ambulatory Visit: Payer: Medicare Other | Admitting: Psychology

## 2021-05-28 ENCOUNTER — Other Ambulatory Visit: Payer: Self-pay | Admitting: Cardiology

## 2021-05-28 DIAGNOSIS — R002 Palpitations: Secondary | ICD-10-CM

## 2021-05-28 DIAGNOSIS — I341 Nonrheumatic mitral (valve) prolapse: Secondary | ICD-10-CM

## 2021-06-08 ENCOUNTER — Ambulatory Visit (INDEPENDENT_AMBULATORY_CARE_PROVIDER_SITE_OTHER): Payer: Medicare Other | Admitting: Psychology

## 2021-06-08 DIAGNOSIS — F4321 Adjustment disorder with depressed mood: Secondary | ICD-10-CM | POA: Diagnosis not present

## 2021-06-24 ENCOUNTER — Ambulatory Visit: Payer: Medicare Other | Admitting: Psychology

## 2021-07-06 ENCOUNTER — Ambulatory Visit (INDEPENDENT_AMBULATORY_CARE_PROVIDER_SITE_OTHER): Payer: Medicare Other | Admitting: Psychology

## 2021-07-06 DIAGNOSIS — F4321 Adjustment disorder with depressed mood: Secondary | ICD-10-CM | POA: Diagnosis not present

## 2021-07-22 ENCOUNTER — Ambulatory Visit (INDEPENDENT_AMBULATORY_CARE_PROVIDER_SITE_OTHER): Payer: Medicare Other | Admitting: Psychology

## 2021-07-22 DIAGNOSIS — F4321 Adjustment disorder with depressed mood: Secondary | ICD-10-CM | POA: Diagnosis not present

## 2021-07-26 DIAGNOSIS — Z23 Encounter for immunization: Secondary | ICD-10-CM | POA: Diagnosis not present

## 2021-07-27 DIAGNOSIS — E559 Vitamin D deficiency, unspecified: Secondary | ICD-10-CM | POA: Diagnosis not present

## 2021-08-06 ENCOUNTER — Ambulatory Visit (INDEPENDENT_AMBULATORY_CARE_PROVIDER_SITE_OTHER): Payer: Medicare Other | Admitting: Psychology

## 2021-08-06 DIAGNOSIS — F4321 Adjustment disorder with depressed mood: Secondary | ICD-10-CM

## 2021-08-11 NOTE — Progress Notes (Signed)
Cardiology Office Note    Date:  08/18/2021   ID:  Brenda, Hudson 12/14/1950, MRN [id number]   PCP:  [name], [facility]  Cardiologist:  Ena Dawley, MD (Inactive)   Advanced Practice Provider:  No care team member to display Electrophysiologist:  Cristopher Peru, MD   [phone]   Chief Complaint  Patient presents with   Follow-up     History of Present Illness:  Brenda Hudson is a 70 y.o. female with history of palpitations for nonsustained atrial tachycardia, frequent PVCs, high burden PACs on monitor in 2015 followed by Dr. Lovena Le and treated with flecainide.  Heart rate slow on low-dose Toprol but asymptomatic. Echo 06/2020 normal LVEF 60 to 65%, trivial to mild MR. Holter monitor 06/19/2020 sinus bradycardia normal sinus rhythm and sinus tachycardia nonsustained atrial tachycardia 8 beats fastest 141 bpm.  Patient followed up with Dr. Lovena Le who discussed other antiarrhythmics but continued flecainide and Toprol.  Patient comes in for f/u. Has palpitations if she doesn't sleep well or stressed. Doesn't last long, comes and goes. Overall well controlled. Walks a couple miles daily, stays busy with dogs, cats, rabbits, and horses.    Past Medical History:  Diagnosis Date   Allergy    Anxiety    Arthritis    Diverticulosis    Frequent PVCs    and PAC per pt/has had along time   GERD (gastroesophageal reflux disease)    Heart murmur    Hx of echocardiogram    a. Echo 12/13:  EF 55-60%, mild MR, normal diast fxn, no MVP   IBS (irritable bowel syndrome)    MVP (mitral valve prolapse)    Osteoporosis    Palpitations    Polymyalgia rheumatica (HCC)    Rectal pain    Spinal stenosis     Past Surgical History:  Procedure Laterality Date   COLONOSCOPY     DILATION AND CURETTAGE OF UTERUS     TONSILLECTOMY     TUBAL LIGATION      Current Medications: Current Meds  Medication Sig   ACETAMINOPHEN PO Take by  mouth as needed.   Cholecalciferol (VITAMIN D3) 50 MCG (2000 UT) CAPS Take by mouth.   guaiFENesin (MUCINEX) 600 MG 12 hr tablet Take 600 mg by mouth 2 (two) times daily. As needed for allergies   Ibuprofen 200 MG CAPS Take 400 mg by mouth in the morning and at bedtime.    loratadine (CLARITIN) 10 MG tablet Take 10 mg by mouth daily as needed for allergies.   metoprolol succinate (TOPROL-XL) 25 MG 24 hr tablet TAKE 1 TABLET (25 MG TOTAL) BY MOUTH DAILY.   [DISCONTINUED] flecainide (TAMBOCOR) 100 MG tablet TAKE 1 TABLET BY MOUTH 2 TIMES DAILY. PLEASE KEEP UPCOMING APPT IN JUNE     Allergies:   Cefaclor, Penicillins, and Sulfonamide derivatives   Social History   Socioeconomic History   Marital status: Widowed    Spouse name: Not on file   Number of children: 2   Years of education: Not on file   Highest education level: Not on file  Occupational History   Occupation: Teacher    Comment: Special Education   Tobacco Use   Smoking status: Never   Smokeless tobacco: Never  Substance and Sexual Activity   Alcohol use: No   Drug use: No   Sexual activity: Not on file  Other Topics Concern   Not on file  Social History  Narrative   Widowed in 2014 (husband sudden death MI despite being in great shape). 2 daughters. Oldest daughter pregnant in 09/2014-Phd student at [employer] in early [place]. Younger daughter [name] grad student [employer] [place] in [place].       [employer]. Both she and her husband. Husband went to [employer] for lawschool. 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, regular walking, reading, dogs (2) 6 horses    Social Determinants of Radio broadcast assistant Strain: Low Risk    Difficulty of Paying Living Expenses: Not hard at all  Food Insecurity: No Food Insecurity   Worried About Charity fundraiser in the Last Year:  Never true   Arboriculturist in the Last Year: Never true  Transportation Needs: No Transportation Needs   Lack of Transportation (Medical): No   Lack of Transportation (Non-Medical): No  Physical Activity: Sufficiently Active   Days of Exercise per Week: 7 days   Minutes of Exercise per Session: 40 min  Stress: No Stress Concern Present   Feeling of Stress : Not at all  Social Connections: Socially Isolated   Frequency of Communication with Friends and Family: More than three times a week   Frequency of Social Gatherings with Friends and Family: More than three times a week   Attends Religious Services: Never   Marine scientist or Organizations: No   Attends Archivist Meetings: Never   Marital Status: Widowed     Family History:  The patient's  family history includes Colon polyps in her father; Diabetes in her father; Heart disease in her father; Kidney disease in her father; Sleep apnea in her mother.   ROS:   Please see the history of present illness.    ROS All other systems reviewed and are negative.   PHYSICAL EXAM:   VS:  BP 116/70   Pulse (!) 54   Ht 5\' 3"  (1.6 m)   Wt 111 lb 6.4 oz (50.5 kg)   SpO2 99%   BMI 19.73 kg/m   Physical Exam  GEN: Thin, in no acute distress  Neck: no JVD, carotid bruits, or masses Cardiac:RRR; no murmurs, rubs, or gallops  Respiratory:  clear to auscultation bilaterally, normal work of breathing GI: soft, nontender, nondistended, + BS Ext: without cyanosis, clubbing, or edema, Good distal pulses bilaterally Neuro:  Alert and Oriented x 3, Psych: euthymic mood, full affect  Wt Readings from Last 3 Encounters:  08/18/21 111 lb 6.4 oz (50.5 kg)  05/11/21 109 lb 6.4 oz (49.6 kg)  07/15/20 112 lb (50.8 kg)      Studies/Labs Reviewed:   EKG:  EKG is ordered today.  The ekg ordered today demonstrates sinus bradycardia, IRBBB, nonspecific ST changes, no change from prior tracings  Recent Labs: No results found for  requested labs within last 8760 hours.   Lipid Panel    Component Value Date/Time   CHOL 205 (H) 03/11/2020 1217   TRIG 87.0 03/11/2020 1217   HDL 71.70 03/11/2020 1217   CHOLHDL 3 03/11/2020 1217   VLDL 17.4 03/11/2020 1217   LDLCALC 116 (H) 03/11/2020 1217   LDLDIRECT 149.9 01/20/2011 0815    Additional studies/ records that were reviewed today include:  Holter monitor 06/2020 Sinus bradycardia, normal sinus rhythm and sinus tachycardia. The average heart rate was 65bpm  and ranged from 43-115bpm. Rare PVCs Occasional PACs and rare atrial couplets and triplets. Nonsustained atrial tachycardia up to 8 beats with fastest episode 141bpm.  2D echo 06/2020 IMPRESSIONS     1. Left ventricular ejection fraction, by estimation, is 60 to 65%. The  left ventricle has normal function. The left ventricle has no regional  wall motion abnormalities. Left ventricular diastolic parameters were  normal.   2. Right ventricular systolic function is normal. The right ventricular  size is normal. There is normal pulmonary artery systolic pressure.   3. The mitral valve is abnormal, mildly thickened leaflets. Trivial to  mild regurgitation. No prolapse.   4. The aortic valve is tricuspid. Aortic valve regurgitation is not  visualized.   5. The inferior vena cava is normal in size with greater than 50%  respiratory variability, suggesting right atrial pressure of 3 mmHg.   Comparison(s): 10/30/2012: LVEF 55-60%, normal MV with mild regurgitation,  no prolapse.  Risk Assessment/Calculations:         ASSESSMENT:    1. Atrial tachycardia (HCC)   2. Palpitations   3. Frequent PVCs   4. Mitral valve prolapse      PLAN:  In order of problems listed above:  Nonsustained atrial tachycardia/bradycardia maintained on flecainide and Toprol and doing well. Occasional palpitations but overall well controlled. She has appt next week with Dr. Yong Channel and will have blood work there(not fasting  today)  Freq PVC's controlled on above meds  History of MVP and mild MR-murmur mild, asymptomatic  Shared Decision Making/Informed Consent        Medication Adjustments/Labs and Tests Ordered: Current medicines are reviewed at length with the patient today.  Concerns regarding medicines are outlined above.  Medication changes, Labs and Tests ordered today are listed in the Patient Instructions below. Patient Instructions  Medication Instructions:  Your physician recommends that you continue on your current medications as directed. Please refer to the Current Medication list given to you today.  *If you need a refill on your cardiac medications before your next appointment, please call your pharmacy*   Lab Work: None If you have labs (blood work) drawn today and your tests are completely normal, you will receive your results only by: [place] (if you have MyChart) OR A paper copy in the mail If you have any lab test that is abnormal or we need to change your treatment, we will call you to review the results.   Follow-Up: At [facility], you and your health needs are our priority.  As part of our continuing mission to provide you with exceptional heart care, we have created designated Provider Care Teams.  These Care Teams include your primary Cardiologist (physician) and Advanced Practice Providers (APPs -  Physician Assistants and Nurse Practitioners) who all work together to provide you with the care you need, when you need it.   Your next appointment:   1 year(s)  The format for your next appointment:   In Person  Provider:   Cristopher Peru, MD     Signed, Ermalinda Barrios, PA-C  08/18/2021 10:53 AM    [address], [place], [place]  [address] Phone: [phone]; Fax: [phone]

## 2021-08-17 ENCOUNTER — Ambulatory Visit: Payer: Medicare Other | Admitting: Family Medicine

## 2021-08-18 ENCOUNTER — Ambulatory Visit (INDEPENDENT_AMBULATORY_CARE_PROVIDER_SITE_OTHER): Payer: Medicare Other | Admitting: Physician Assistant

## 2021-08-18 ENCOUNTER — Other Ambulatory Visit: Payer: Self-pay

## 2021-08-18 ENCOUNTER — Encounter: Payer: Self-pay | Admitting: Physician Assistant

## 2021-08-18 VITALS — BP 116/70 | HR 54 | Ht 63.0 in | Wt 111.4 lb

## 2021-08-18 DIAGNOSIS — I471 Supraventricular tachycardia: Secondary | ICD-10-CM | POA: Diagnosis not present

## 2021-08-18 DIAGNOSIS — R002 Palpitations: Secondary | ICD-10-CM

## 2021-08-18 DIAGNOSIS — I493 Ventricular premature depolarization: Secondary | ICD-10-CM

## 2021-08-18 DIAGNOSIS — I341 Nonrheumatic mitral (valve) prolapse: Secondary | ICD-10-CM | POA: Diagnosis not present

## 2021-08-18 MED ORDER — METOPROLOL SUCCINATE ER 25 MG PO TB24: 25.0000 mg | ORAL_TABLET | Freq: Every day | ORAL | 3 refills | Status: DC

## 2021-08-18 MED ORDER — FLECAINIDE ACETATE 100 MG PO TABS: 100.0000 mg | ORAL_TABLET | Freq: Two times a day (BID) | ORAL | 0 refills | Status: DC

## 2021-08-18 NOTE — Patient Instructions (Signed)
Medication Instructions:  Your physician recommends that you continue on your current medications as directed. Please refer to the Current Medication list given to you today.  *If you need a refill on your cardiac medications before your next appointment, please call your pharmacy*   Lab Work: None If you have labs (blood work) drawn today and your tests are completely normal, you will receive your results only by: MyChart Message (if you have MyChart) OR A paper copy in the mail If you have any lab test that is abnormal or we need to change your treatment, we will call you to review the results.   Follow-Up: At [facility], you and your health needs are our priority.  As part of our continuing mission to provide you with exceptional heart care, we have created designated Provider Care Teams.  These Care Teams include your primary Cardiologist (physician) and Advanced Practice Providers (APPs -  Physician Assistants and Nurse Practitioners) who all work together to provide you with the care you need, when you need it.  Your next appointment:   1 year(s)  The format for your next appointment:   In Person  Provider:   Gregg Taylor, MD   

## 2021-08-19 NOTE — Progress Notes (Signed)
Phone [phone] In person visit   Subjective:   Brenda Hudson is a 70 y.o. year old very pleasant female patient who presents for/with See problem oriented charting Chief Complaint  Patient presents with   Arthritis    Back, neck and hands.    Labs Only    Patient states that she is fasting and would like to have lab work done per the cardiologist that she seen last week.    Insomnia    Patient states that she's not sleeping well and she feels like she is in a brain fog due to all the stress in her life right now.     This visit occurred during the SARS-CoV-2 public health emergency.  Safety protocols were in place, including screening questions prior to the visit, additional usage of staff PPE, and extensive cleaning of exam room while observing appropriate contact time as indicated for disinfecting solutions.   Past Medical History-  Patient Active Problem List   Diagnosis Date Noted   Spinal stenosis of lumbar region with neurogenic claudication 08/21/2019    Priority: 1.   PAC (premature atrial contraction) 09/10/2014    Priority: 1.   Osteopenia 01/02/2020    Priority: 2.   Cervical spinal stenosis 08/21/2019    Priority: 2.   Headache, unspecified 08/21/2019    Priority: 3.   PVC's (premature ventricular contractions) 04/04/2018    Priority: 3.   Low back pain 09/30/2014    Priority: 3.   IBS (irritable bowel syndrome) 09/30/2014    Priority: 3.   Mitral valve prolapse 09/30/2014    Priority: 3.   Palpitations 09/10/2014    Priority: 3.   History of polymyalgia rheumatica 03/21/2008    Priority: 3.    Medications- reviewed and updated Current Outpatient Medications  Medication Sig Dispense Refill   ACETAMINOPHEN PO Take by mouth as needed.     Cholecalciferol (VITAMIN D3) 50 MCG (2000 UT) CAPS Take by mouth.     flecainide (TAMBOCOR) 100 MG tablet Take 1 tablet (100 mg total) by mouth 2 (two) times daily. TAKE 1 TABLET BY MOUTH 2 TIMES DAILY. 180  tablet 0   guaiFENesin (MUCINEX) 600 MG 12 hr tablet Take 600 mg by mouth 2 (two) times daily. As needed for allergies     Ibuprofen 200 MG CAPS Take 400 mg by mouth in the morning and at bedtime.      loratadine (CLARITIN) 10 MG tablet Take 10 mg by mouth daily as needed for allergies.     metoprolol succinate (TOPROL-XL) 25 MG 24 hr tablet Take 1 tablet (25 mg total) by mouth daily. 90 tablet 3   No current facility-administered medications for this visit.     Objective:  BP 106/72   Pulse (!) 52   Temp 98 F (36.7 C) (Temporal)   Ht 5\' 4"  (1.626 m)   Wt 110 lb 3.2 oz (50 kg)   SpO2 99%   BMI 18.92 kg/m  Gen: NAD, resting comfortably CV: Slightly bradycardic no murmurs rubs or gallops Lungs: CTAB no crackles, wheeze, rhonchi Ext: no edema Skin: warm, dry     Assessment and Plan   # Osteoarthritis of bilateral hands/bilateral hand pain S:I suspected underlying arthritis for hand pain in 03/11/2020 visit.  Patient had some stiffness in the morning but only last for a few minutes-did not strongly suspect rheumatoid arthritis.  Patient was worried about enlargement of joints and deviation of fingers  DG HAND COMPLETE RIGHT 03/11/2020:  "  FINDINGS: No fracture or malalignment. Mild joint space narrowing at the second and third MCP joints. No definitive erosions.   IMPRESSION: 1. No acute osseous abnormality 2. Arthritis at the second and third MCP joints."  Today patient mentions pain is worsening. Struggling opening a milk carton or trouble writing due to pain. Pain worsens if cold in particular- struggles to zip or unzip clothes for instance. Her concern is for long term trajectory. She is worried about voltaren gel and her cats- is using at night and that helps slightly. No morning stiffness but more weakness.    A/P: Patient with osteoarthritis of the hands-fortunately has been using Voltaren at night and has had some mild relief-she still has some significant  weakness/trouble with day-to-day functions at times-she would like rheumatology's opinion.  We also discussed possible sports medicine referral.  We will start with a referral to [facility]-if rejected would consider referral to [facility] rheumatology or [facility] but patient would like to stay within the [facility] network if possible -Continue Voltaren if she can tolerate using it more often (with her Situation) I think that would be helpful as well  #hyperlipidemia S: Medication:None  Lab Results  Component Value Date   CHOL 205 (H) 03/11/2020   HDL 71.70 03/11/2020   LDLCALC 116 (H) 03/11/2020   LDLDIRECT 149.9 01/20/2011   TRIG 87.0 03/11/2020   CHOLHDL 3 03/11/2020   A/P: 10-year ASCVD risk based off last labs, current age, current blood pressure only 5.9%-does have family history of CVA in father at a younger age but he was a smoker and overweight and diabetic.  We discussed if her risk gets above 7.5% considering coronary artery calcium scoring to get more information but for now we will continue to monitor and continue her excellent healthy lifestyle choices  #Insomnia S: a lot of family stress. Has been seeing Dr. Cheryln Manly for 6 years and continues this. Grief and other issues in particular.   Doesn't sleep well and when she doesn't feels more forgetful/foggy.   She states "lightweight" with alcohol and medication.   Has not tried melatonin yet.   In bed by 11.   Phone or tv up to bedtime.   Daytimes sleepiness A/P: Patient with insomnia issues likely related to significant stress.  She was worried about her memory but mini cog today was 3 out of 3 recall with normal clock draw-I think most of her memory issues are related to stress and poor sleep.  Patient would be very concerned about stronger medication such as Ambien  We opted to try melatonin 1 mg and discussed sleep hygiene-see after visit summary.  If she fails to improve we discussed  possibly trial of trazodone or possible OSA testing-she has excessive daytime sleepiness plus has been reported in the past to have some snoring-does not have the typical phenotype but this is still possible  #Atrial tachycardia/PACs S: Metoprolol 25 mg daily, flecainide 100 mg twice daily A/P: Overall stable-recently had cardiology visit.  They suggested we update labs this time and she is due anyway-we ordered CBC, CMP, lipid panel.  For atrial tachycardia-continue current medications as well as cardiology follow-up  Lumbar stenosis : S: In the past surgery recommended by Dr. Sherwood Gambler. 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 She was working on getting things lined up to be able to move forward with that.  Was using ibuprofen in the past A/P: This was not a primary issue for patient today.  Continue to monitor  #Irritable bowel syndrome/history of colitis-noted.  Patient does not report any significant worsening of symptoms at this time  #Prior headaches-patient did not report significant issues at this time  Recommended follow up: Return in about 3 months (around 11/26/2021) for follow up- or sooner if needed. Future Appointments  Date Time Provider [place]  09/08/2021  9:30 AM Oren Binet, PhD [facility] None  05/14/2022  9:30 AM [facility] HEALTH COACH [facility]   Lab/Order associations: Fasting   ICD-10-CM   1. Primary osteoarthritis of both hands  M19.041 Ambulatory referral to Rheumatology   M19.042     2. Bilateral hand pain  M79.641 Ambulatory referral to Rheumatology   M79.642     3. Insomnia, unspecified type  G47.00     4. Atrial tachycardia (HCC)  I47.1     5. Hyperlipidemia, unspecified hyperlipidemia type  E78.5 CBC with Differential/Platelet    Comprehensive metabolic panel    Lipid panel    POCT Urinalysis Dipstick (Automated)    TSH    6. PAC (premature atrial contraction)  I49.1       Time Spent: 36 minutes of total  time (10:55 AM- 11:31 AM) was spent on the date of the encounter performing the following actions: chart review prior to seeing the patient, obtaining history, performing a medically necessary exam, counseling on the treatment plan, placing orders, and documenting in our EHR.    I,Jada Bradford,acting as a scribe for Garret Reddish, MD.,have documented all relevant documentation on the behalf of Garret Reddish, MD,as directed by  Garret Reddish, MD while in the presence of Garret Reddish, MD.    I, Garret Reddish, MD, have reviewed all documentation for this visit. The documentation on 08/26/21 for the exam, diagnosis, procedures, and orders are all accurate and complete.   Return precautions advised.  Garret Reddish, MD

## 2021-08-21 NOTE — Addendum Note (Signed)
Addended by: Juventino Slovak on: 08/21/2021 08:51 AM   Modules accepted: Orders

## 2021-08-25 ENCOUNTER — Ambulatory Visit (INDEPENDENT_AMBULATORY_CARE_PROVIDER_SITE_OTHER): Payer: Medicare Other | Admitting: Psychology

## 2021-08-25 DIAGNOSIS — F4321 Adjustment disorder with depressed mood: Secondary | ICD-10-CM | POA: Diagnosis not present

## 2021-08-26 ENCOUNTER — Encounter: Payer: Self-pay | Admitting: Family Medicine

## 2021-08-26 ENCOUNTER — Other Ambulatory Visit: Payer: Self-pay

## 2021-08-26 ENCOUNTER — Ambulatory Visit (INDEPENDENT_AMBULATORY_CARE_PROVIDER_SITE_OTHER): Payer: Medicare Other | Admitting: Family Medicine

## 2021-08-26 VITALS — BP 106/72 | HR 52 | Temp 98.0°F | Ht 64.0 in | Wt 110.2 lb

## 2021-08-26 DIAGNOSIS — I491 Atrial premature depolarization: Secondary | ICD-10-CM

## 2021-08-26 DIAGNOSIS — M79642 Pain in left hand: Secondary | ICD-10-CM | POA: Diagnosis not present

## 2021-08-26 DIAGNOSIS — M79641 Pain in right hand: Secondary | ICD-10-CM | POA: Diagnosis not present

## 2021-08-26 DIAGNOSIS — G4700 Insomnia, unspecified: Secondary | ICD-10-CM

## 2021-08-26 DIAGNOSIS — I471 Supraventricular tachycardia: Secondary | ICD-10-CM

## 2021-08-26 DIAGNOSIS — M48062 Spinal stenosis, lumbar region with neurogenic claudication: Secondary | ICD-10-CM

## 2021-08-26 DIAGNOSIS — M1990 Unspecified osteoarthritis, unspecified site: Secondary | ICD-10-CM

## 2021-08-26 DIAGNOSIS — K589 Irritable bowel syndrome without diarrhea: Secondary | ICD-10-CM

## 2021-08-26 DIAGNOSIS — Z8719 Personal history of other diseases of the digestive system: Secondary | ICD-10-CM

## 2021-08-26 DIAGNOSIS — M25562 Pain in left knee: Secondary | ICD-10-CM

## 2021-08-26 DIAGNOSIS — M19042 Primary osteoarthritis, left hand: Secondary | ICD-10-CM

## 2021-08-26 DIAGNOSIS — E785 Hyperlipidemia, unspecified: Secondary | ICD-10-CM

## 2021-08-26 DIAGNOSIS — M19041 Primary osteoarthritis, right hand: Secondary | ICD-10-CM | POA: Diagnosis not present

## 2021-08-26 LAB — CBC WITH DIFFERENTIAL/PLATELET
Basophils Absolute: 0 10*3/uL (ref 0.0–0.1)
Basophils Relative: 0.2 % (ref 0.0–3.0)
Eosinophils Absolute: 0.1 10*3/uL (ref 0.0–0.7)
Eosinophils Relative: 1.9 % (ref 0.0–5.0)
HCT: 40.3 % (ref 36.0–46.0)
Hemoglobin: 13.5 g/dL (ref 12.0–15.0)
Lymphocytes Relative: 20.4 % (ref 12.0–46.0)
Lymphs Abs: 1.4 10*3/uL (ref 0.7–4.0)
MCHC: 33.5 g/dL (ref 30.0–36.0)
MCV: 91.2 fl (ref 78.0–100.0)
Monocytes Absolute: 0.5 10*3/uL (ref 0.1–1.0)
Monocytes Relative: 6.9 % (ref 3.0–12.0)
Neutro Abs: 5 10*3/uL (ref 1.4–7.7)
Neutrophils Relative %: 70.6 % (ref 43.0–77.0)
Platelets: 241 10*3/uL (ref 150.0–400.0)
RBC: 4.42 Mil/uL (ref 3.87–5.11)
RDW: 13.4 % (ref 11.5–15.5)
WBC: 7.1 10*3/uL (ref 4.0–10.5)

## 2021-08-26 LAB — COMPREHENSIVE METABOLIC PANEL
ALT: 12 U/L (ref 0–35)
AST: 24 U/L (ref 0–37)
Albumin: 4.4 g/dL (ref 3.5–5.2)
Alkaline Phosphatase: 25 U/L — ABNORMAL LOW (ref 39–117)
BUN: 14 mg/dL (ref 6–23)
CO2: 31 mEq/L (ref 19–32)
Calcium: 9.7 mg/dL (ref 8.4–10.5)
Chloride: 102 mEq/L (ref 96–112)
Creatinine, Ser: 0.83 mg/dL (ref 0.40–1.20)
GFR: 72.66 mL/min (ref >60.00)
Glucose, Bld: 94 mg/dL (ref 70–99)
Potassium: 4.4 mEq/L (ref 3.5–5.1)
Sodium: 141 mEq/L (ref 135–145)
Total Bilirubin: 0.5 mg/dL (ref 0.2–1.2)
Total Protein: 7.1 g/dL (ref 6.0–8.3)

## 2021-08-26 LAB — LIPID PANEL
Cholesterol: 197 mg/dL (ref 0–200)
HDL: 81.3 mg/dL (ref >39.00)
LDL Cholesterol: 103 mg/dL — ABNORMAL HIGH (ref 0–99)
NonHDL: 115.87
Total CHOL/HDL Ratio: 2
Triglycerides: 62 mg/dL (ref 0.0–149.0)
VLDL: 12.4 mg/dL (ref 0.0–40.0)

## 2021-08-26 LAB — POC URINALSYSI DIPSTICK (AUTOMATED)
Bilirubin, UA: NEGATIVE
Blood, UA: NEGATIVE
Glucose, UA: NEGATIVE
Ketones, UA: NEGATIVE
Leukocytes, UA: NEGATIVE
Nitrite, UA: NEGATIVE
Protein, UA: NEGATIVE
Spec Grav, UA: <=1.005 — AB (ref 1.010–1.025)
Urobilinogen, UA: 0.2 E.U./dL
pH, UA: 6.5 (ref 5.0–8.0)

## 2021-08-26 LAB — TSH: TSH: 0.95 u[IU]/mL (ref 0.35–5.50)

## 2021-08-26 NOTE — Addendum Note (Signed)
Addended by: Loura Back on: 08/26/2021 11:44 AM   Modules accepted: Orders

## 2021-08-26 NOTE — Patient Instructions (Addendum)
Health Maintenance Due  Topic Date Due   Zoster Vaccines- Shingrix (1 of 2)   Please check with your pharmacy to see if they have the shingrix vaccine. If they do- please get this immunization and update [place] by phone call or mychart with dates you receive the vaccine  Never done   Please stop by lab before you go If you have mychart- we will send your results within 3 business days of [place] receiving them.  If you do not have mychart- we will call you about results within 5 business days of [place] receiving them.  *please also note that you will see labs on mychart as soon as they post. I will later go in and write notes on them- will say "notes from Dr. Yong Channel"  We will call you within two weeks about your referral to rheumatology for hand pain. If you do not hear within 2 weeks, give [place] a call.   Please try Melatonin 1 mg for 2 weeks.  Take pill 30 minutes-1 hour before bedtime. No screens after taking melatonin. Try to have consistent bedtime every night and wake time.   Recommended follow up: Return in about 3 months (around 11/26/2021) for follow up- or sooner if needed.

## 2021-09-08 ENCOUNTER — Ambulatory Visit: Payer: Medicare Other | Admitting: Psychology

## 2021-09-16 ENCOUNTER — Ambulatory Visit (INDEPENDENT_AMBULATORY_CARE_PROVIDER_SITE_OTHER): Payer: Medicare Other | Admitting: Psychology

## 2021-09-16 DIAGNOSIS — F4321 Adjustment disorder with depressed mood: Secondary | ICD-10-CM

## 2021-09-22 ENCOUNTER — Ambulatory Visit (INDEPENDENT_AMBULATORY_CARE_PROVIDER_SITE_OTHER): Payer: Medicare Other | Admitting: Psychology

## 2021-09-22 DIAGNOSIS — F4321 Adjustment disorder with depressed mood: Secondary | ICD-10-CM

## 2021-09-22 IMAGING — DX DG HAND COMPLETE 3+V*R*
3 series · 3 of 3 positions shown · non-contrast
Comparison: None.

CLINICAL DATA: Hand pain

EXAM:
RIGHT HAND - COMPLETE 3+ VIEW

[hand pa]
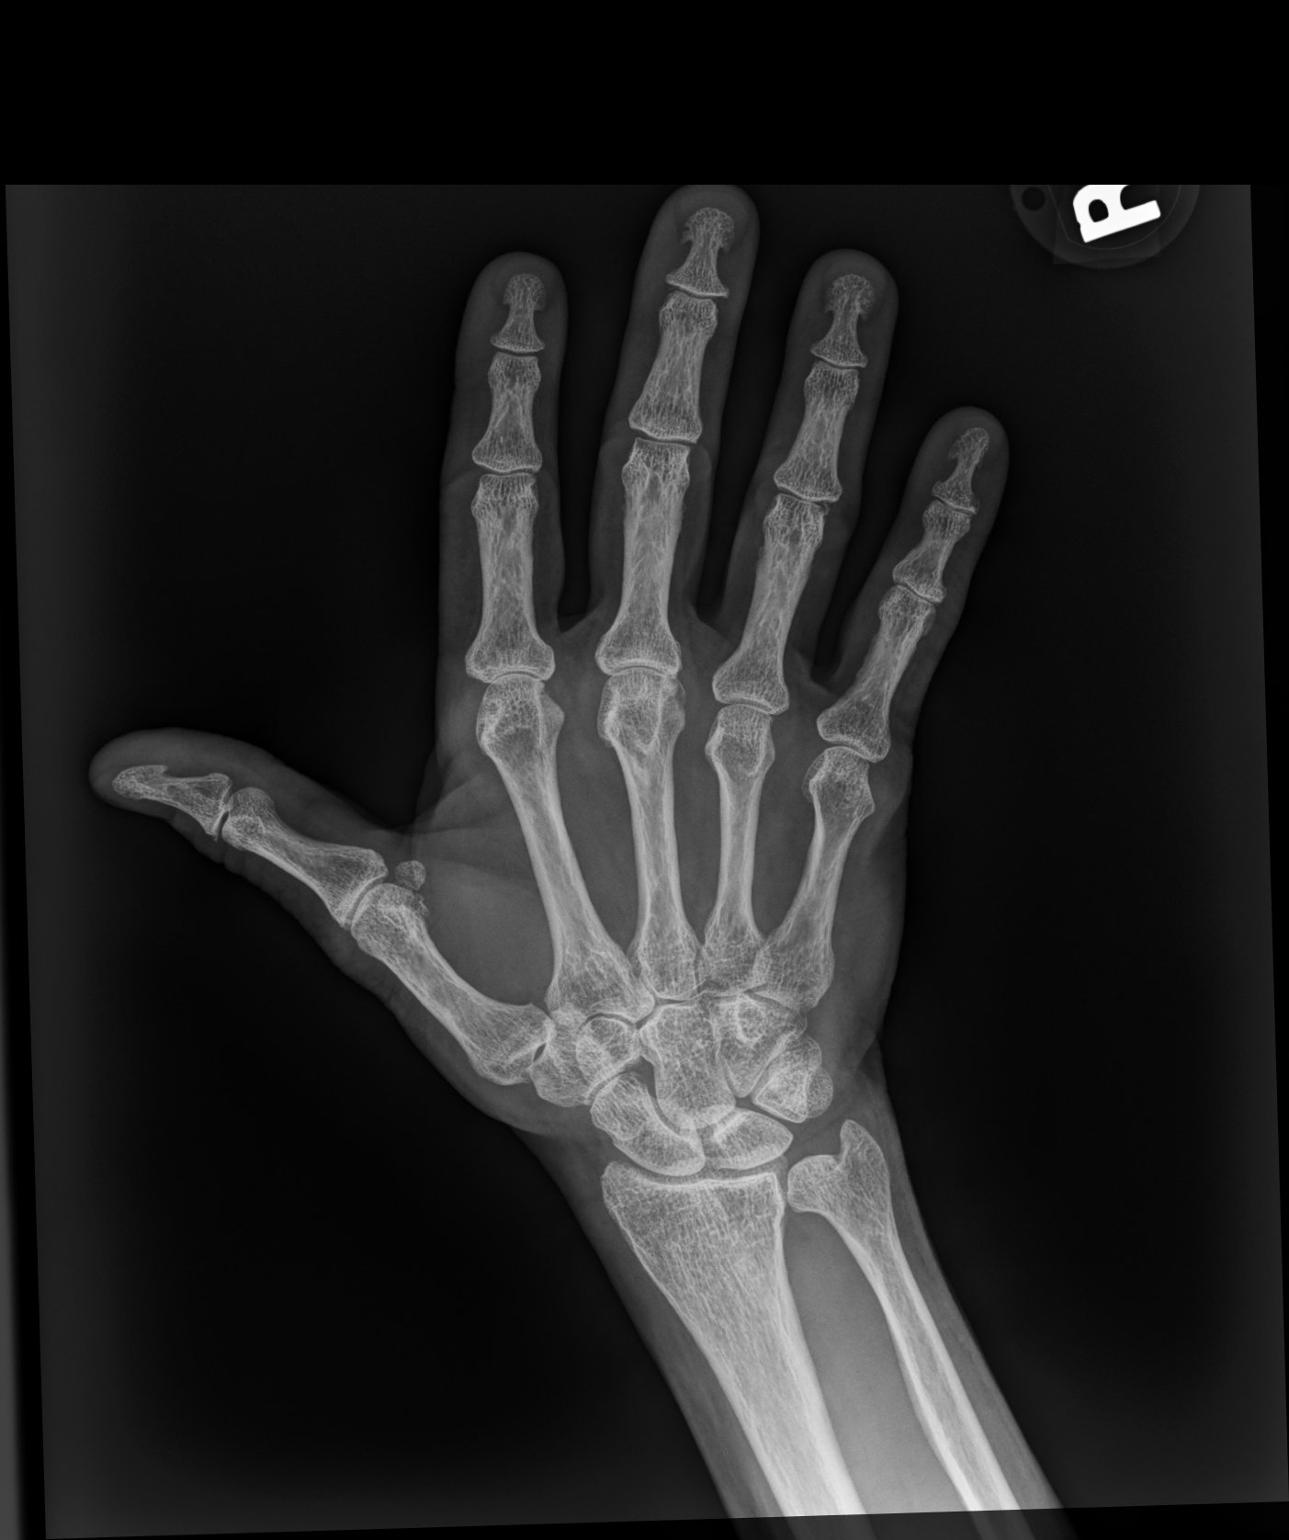

[hand oblique]
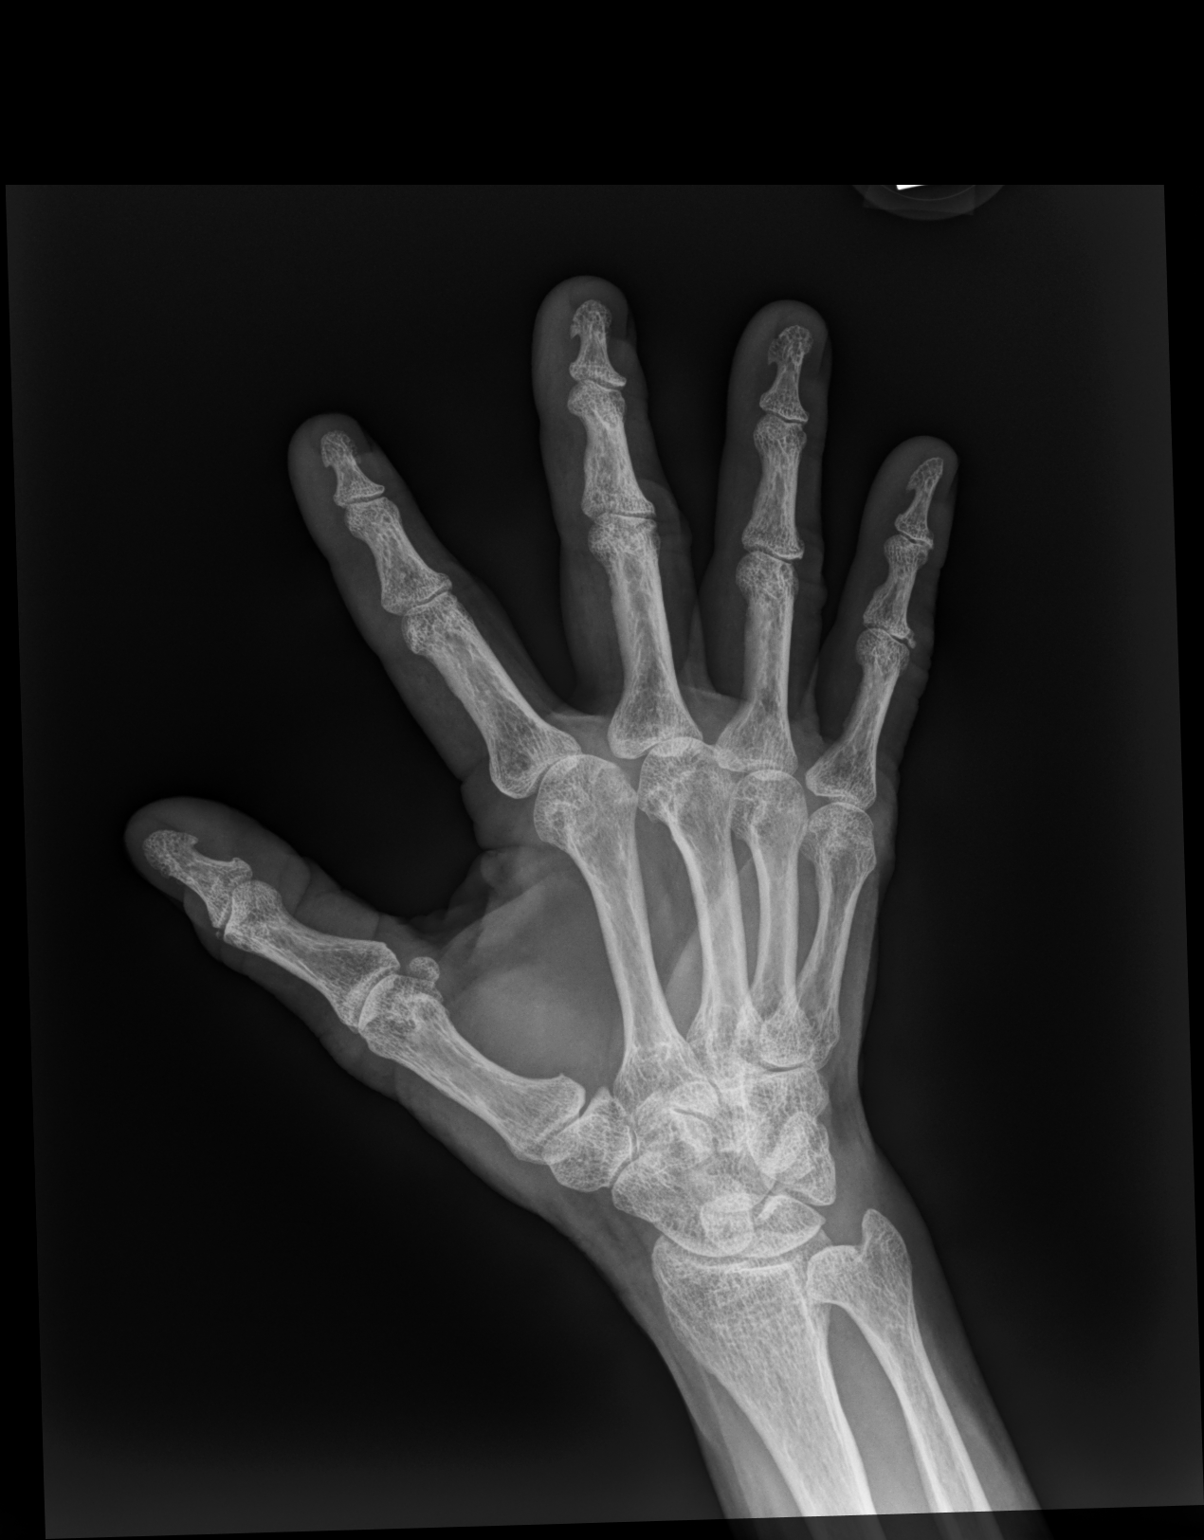

[hand lat]
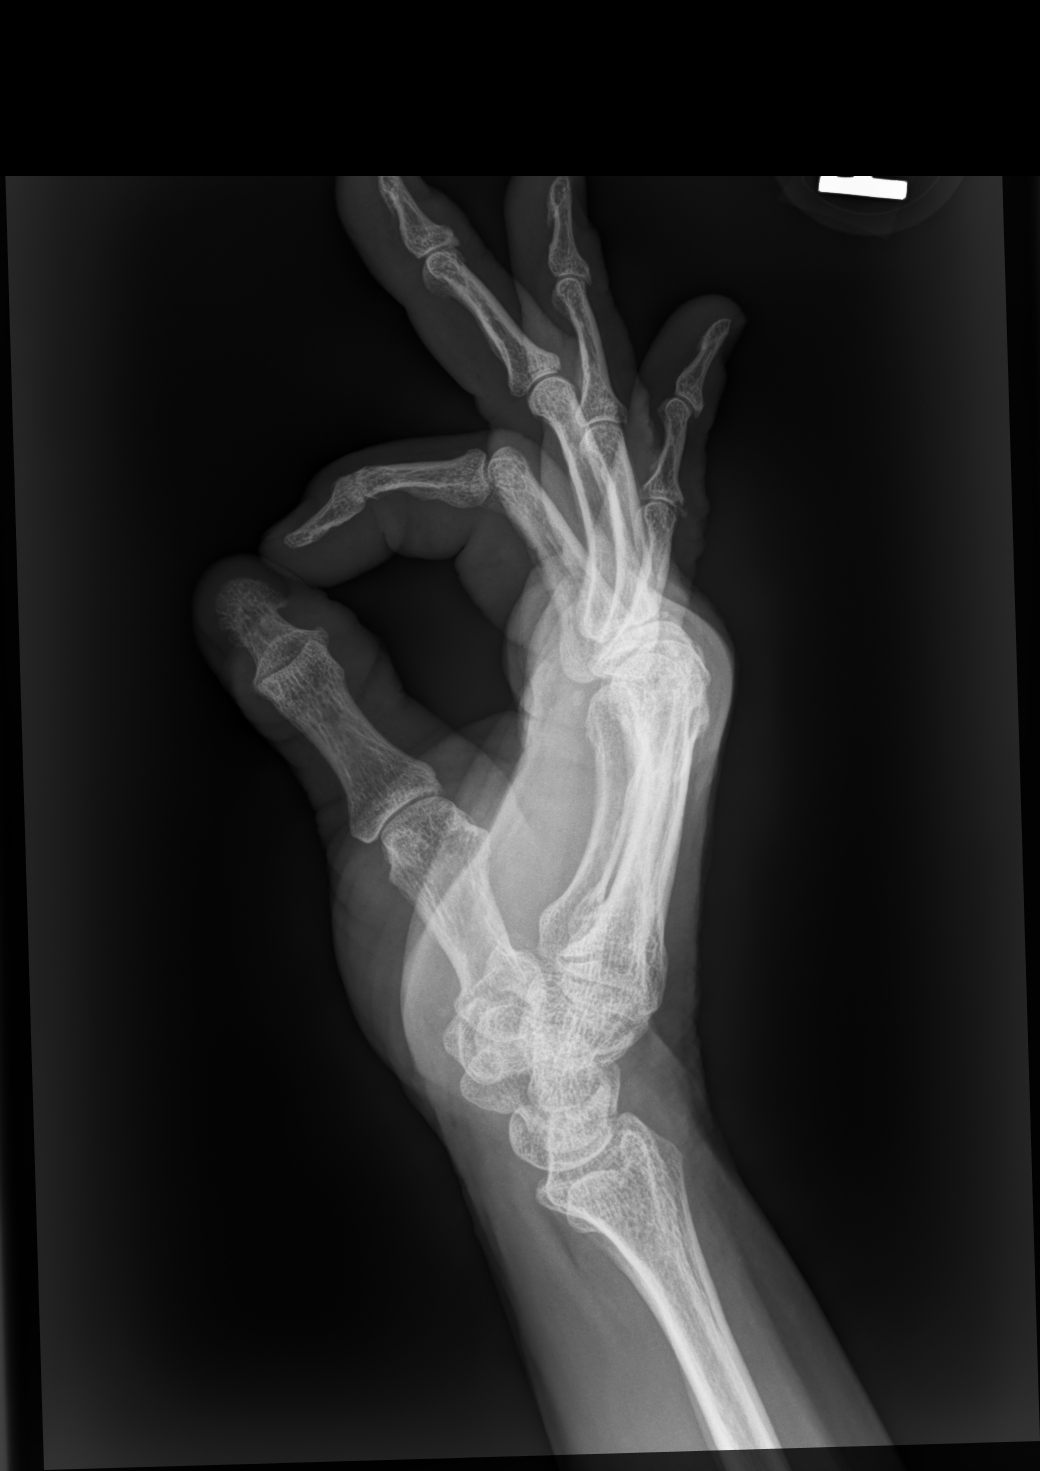

[3 of 3 positions shown; findings below may reference images not displayed]

FINDINGS: No fracture or malalignment. Mild joint space narrowing at the
second and third MCP joints. No definitive erosions.
IMPRESSION: 1. No acute osseous abnormality
2. Arthritis at the second and third MCP joints.

## 2021-09-29 NOTE — Progress Notes (Signed)
Office Visit Note  Patient: Brenda Hudson             Date of Birth: 18-Nov-1950           MRN: [id number]             PCP: [name], MD Referring: [name], MD Visit Date: 09/30/2021 Occupation: Former Pharmacist, hospital  Subjective:  New Patient (Initial Visit) (Bil hand pain and weakness, neck and back pain, bil foot pain, left shoulder and left knee pain)   History of Present Illness: Brenda Hudson is a 70 y.o. female here for osteoarthritis of multiple sites particularly joint pain and decreased strength in bilateral hands. She has a history of PMR for which she took low dose prednisone a total of about 6 months duration about 15 years ago. This improved well but her complaint now is joint pain in multiple areas that is persistent and worsening over time. She has chronic back pain for which she has seen a surgeon and with imaging of disc disease but opting for conservative treatment so far. She also has chronic pain in multiple sites including shoulders, hips, knees, and bilateral hands. She notices swelling in the hands sometimes and just below the right knee sometimes. She has increased difficulty gripping and loss of strength. She had a left shoulder injection about a year ago which was helpful, and no medical interventions since that time. She takes advil and tylenol alternating very regularly on a daily basis which partially help. She took naproxen in the past but topped due to GI irritation.  Labs reviewed 08/2021 CBC wnl CMP alk phos 25 TSH wnl  02/2020 ANA neg RF neg   Imaging reviewed 03/11/2020 Xray right hand IMPRESSION: 1. No acute osseous abnormality 2. Arthritis at the second and third MCP joints.   Activities of Daily Living:  Patient reports morning stiffness for 3 hours.   Patient Reports nocturnal pain.  Difficulty dressing/grooming: Denies Difficulty climbing stairs: Denies Difficulty getting out of chair: Denies Difficulty using hands  for taps, buttons, cutlery, and/or writing: Reports  Review of Systems  Constitutional:  Positive for fatigue.  HENT:  Positive for mouth dryness.   Eyes:  Negative for dryness.  Respiratory:  Positive for shortness of breath.   Cardiovascular:  Positive for swelling in legs/feet.  Gastrointestinal:  Positive for constipation and diarrhea.  Endocrine: Positive for cold intolerance and increased urination.  Genitourinary:  Negative for difficulty urinating.  Musculoskeletal:  Positive for joint pain, gait problem, joint pain, joint swelling, muscle weakness and morning stiffness.  Skin:  Negative for rash.  Allergic/Immunologic: Negative for susceptible to infections.  Neurological:  Positive for numbness and weakness.  Hematological:  Positive for bruising/bleeding tendency.  Psychiatric/Behavioral:  Positive for sleep disturbance.    PMFS History:  Patient Active Problem List   Diagnosis Date Noted   Generalized osteoarthritis of multiple sites 09/30/2021   Migraine 03/13/2020   Osteopenia 01/02/2020   Headache, unspecified 08/21/2019   Spinal stenosis of lumbar region with neurogenic claudication 08/21/2019   Cervical spinal stenosis 08/21/2019   Impacted cerumen of left ear 06/20/2019   PVC's (premature ventricular contractions) 04/04/2018   Low back pain 09/30/2014   IBS (irritable bowel syndrome) 09/30/2014   Mitral valve prolapse 09/30/2014   Palpitations 09/10/2014   PAC (premature atrial contraction) 09/10/2014   History of polymyalgia rheumatica 03/21/2008    Past Medical History:  Diagnosis Date   Allergy    Anxiety  Arthritis    Diverticulosis    Frequent PVCs    and PAC per pt/has had along time   GERD (gastroesophageal reflux disease)    Heart murmur    Hx of echocardiogram    a. Echo 12/13:  EF 55-60%, mild MR, normal diast fxn, no MVP   IBS (irritable bowel syndrome)    MVP (mitral valve prolapse)    Osteoporosis    Palpitations    Polymyalgia  rheumatica (HCC)    Rectal pain    Spinal stenosis     Family History  Problem Relation Age of Onset   Sleep apnea Mother    Arthritis Mother    Diabetes Father        in 36s   Kidney disease Father        DM related, renal failure   Heart disease Father        CVA young age, Smoker, overweight   Colon polyps Father    Esophageal cancer Neg Hx    Rectal cancer Neg Hx    Stomach cancer Neg Hx    Colon cancer Neg Hx    Past Surgical History:  Procedure Laterality Date   COLONOSCOPY     DILATION AND CURETTAGE OF UTERUS     TONSILLECTOMY     TUBAL LIGATION     Social History   Social History Narrative   Widowed in 12-18-12 (husband sudden death MI despite being in great shape). 2 daughters. Oldest daughter pregnant in 09/2014-Phd student at [employer] in early [place]. Younger daughter [name] grad student [employer] in [place].       [employer]. Both she and her husband. Husband went to [employer] for lawschool. 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, regular walking, reading, dogs (2) 6 horses    Immunization History  Administered Date(s) Administered   Fluad Quad(high Dose 65+) 08/21/2019, 10/24/2020   Influenza Inj Mdck Quad With Preservative 11/15/2018   Influenza Split 11/25/2011   Influenza, High Dose Seasonal PF 07/26/2021   Influenza,inj,Quad PF,6+ Mos 08/28/2013, 09/10/2014, 08/05/2016, 09/22/2017   PFIZER Comirnaty(Gray Top)Covid-19 Tri-Sucrose Vaccine 08/13/2020, 03/27/2021   PFIZER(Purple Top)SARS-COV-2 Vaccination 12/23/2019, 01/17/2020   Pfizer Covid-19 Vaccine Bivalent Booster 38yr & up 07/26/2021   Pneumococcal Conjugate-13 08/21/2019   Td 03/21/2008   Tdap 03/21/2008, 04/25/2015   Unspecified SARS-COV-2 Vaccination 07/26/2021   Zoster, Live 09/07/2013     Objective: Vital Signs: BP 96/60 (BP Location: Right  Arm, Patient Position: Sitting, Cuff Size: Normal)   Pulse 60   Resp 15   Ht 5' 2.5" (1.588 m)   Wt 112 lb (50.8 kg)   BMI 20.16 kg/m    Physical Exam HENT:     Right Ear: External ear normal.     Left Ear: External ear normal.  Eyes:     Conjunctiva/sclera: Conjunctivae normal.  Cardiovascular:     Rate and Rhythm: Normal rate and regular rhythm.  Pulmonary:     Effort: Pulmonary effort is normal.     Breath sounds: Normal breath sounds.  Musculoskeletal:     Right lower leg: No edema.     Left lower leg: No edema.  Skin:    General: Skin is warm and dry.     Findings: No rash.  Psychiatric:        Mood and Affect: Mood normal.  Musculoskeletal Exam:  Neck full ROM no tenderness Shoulders full ROM no tenderness or swelling some pain with overhead abduction bilaterally Elbows full ROM no tenderness or swelling Wrists full ROM no tenderness or swelling Fingers bony enlargement of 2nd-3rd MCP with decreased ROM and some crepitus present Hip normal internal and external rotation, pain with internal rotation, no tenderness to lateral hip palpation Knees full ROM no tenderness or swelling bilateral patellofemoral crepitus present Ankles full ROM no tenderness or swelling    Investigation: No additional findings.  Imaging: No results found.  Recent Labs: Lab Results  Component Value Date   WBC 7.1 08/26/2021   HGB 13.5 08/26/2021   PLT 241.0 08/26/2021   NA 141 08/26/2021   K 4.4 08/26/2021   CL 102 08/26/2021   CO2 31 08/26/2021   GLUCOSE 94 08/26/2021   BUN 14 08/26/2021   CREATININE 0.83 08/26/2021   BILITOT 0.5 08/26/2021   ALKPHOS 25 (L) 08/26/2021   AST 24 08/26/2021   ALT 12 08/26/2021   PROT 7.1 08/26/2021   ALBUMIN 4.4 08/26/2021   CALCIUM 9.7 08/26/2021   GFRAA 94 04/12/2019    Speciality Comments: No specialty comments available.  Procedures:  No procedures performed Allergies: Cefaclor, Penicillins, and Sulfonamide derivatives    Assessment / Plan:     Visit Diagnoses: Generalized osteoarthritis of multiple sites - Plan: XR Hand 2 View Right, XR Hand 2 View Left, Ambulatory referral to Occupational Therapy  Joint pains in multiple areas there are no inflammatory changes on exam or strongly indicated by history.  X-ray of bilateral hands was checked and reviewed in clinic with the patient today is consistent with osteoarthritis changes.  She has generalized osteopenia consistent with previous bone density testing may or may not be contributed to from her past steroid treatment for PMR.  We discussed treatment options for osteoarthritis symptoms will refer to occupational therapy for her hands for recommendations on symptom management function and joint preservation.  History of polymyalgia rheumatica  I do not see any signs of recurrence of PMR or subsequent development of inflammatory arthritis at this time.  Orders: Orders Placed This Encounter  Procedures   XR Hand 2 View Right   XR Hand 2 View Left   Ambulatory referral to Occupational Therapy    No orders of the defined types were placed in this encounter.    Follow-Up Instructions: Return if symptoms worsen or fail to improve.    W , MD  Note - This record has been created using Dragon software.  Chart creation errors have been sought, but may not always  have been located. Such creation errors do not reflect on  the standard of medical care.  

## 2021-09-30 ENCOUNTER — Encounter: Payer: Self-pay | Admitting: Internal Medicine

## 2021-09-30 ENCOUNTER — Ambulatory Visit (INDEPENDENT_AMBULATORY_CARE_PROVIDER_SITE_OTHER): Payer: Medicare Other | Admitting: Internal Medicine

## 2021-09-30 ENCOUNTER — Ambulatory Visit: Payer: Self-pay

## 2021-09-30 ENCOUNTER — Other Ambulatory Visit: Payer: Self-pay

## 2021-09-30 VITALS — BP 96/60 | HR 60 | Resp 15 | Ht 62.5 in | Wt 112.0 lb

## 2021-09-30 DIAGNOSIS — M79642 Pain in left hand: Secondary | ICD-10-CM | POA: Diagnosis not present

## 2021-09-30 DIAGNOSIS — Z8739 Personal history of other diseases of the musculoskeletal system and connective tissue: Secondary | ICD-10-CM | POA: Diagnosis not present

## 2021-09-30 DIAGNOSIS — M79641 Pain in right hand: Secondary | ICD-10-CM | POA: Diagnosis not present

## 2021-09-30 DIAGNOSIS — M159 Polyosteoarthritis, unspecified: Secondary | ICD-10-CM | POA: Diagnosis not present

## 2021-09-30 NOTE — Patient Instructions (Addendum)
I think you would benefit with an evaluation by the occupational therapist. I will put a referral in the system today you should hear form them about scheduling sometime in the next week or so.  For osteoarthritis of the hand several treatments may be beneficial: - Topical antiinflammatory medicine such as diclofenac or Voltaren can be applied to  affected area as needed but may be less effective than oral antiinflammatory medicine. Topical analgesics containing CBD, menthol, or lidocaine can be tried. Capsaicin containing treatments are recommended against for the hand.  - Oral nonsteroidal antiinflammatory medication such as ibuprofen, aleve, celebrex, or mobic are very helpful for osteoarthritis but can cause side effects such as stomach ulcers or hypertension with prolonged use. These should be taken intermittently or as needed, and always taken with food.  - Other oral supplements such as glucosamine chondroitin containing OTC treatments such as osteo bi-flex or other brands do not have strong data supporting effectiveness but can be helpful for some individuals and have no major side effects. Turmeric has some antiinflammatory effect similar to NSAID medications and may help, if taken as a supplement should not be taken above recommended doses.  - Compressive gloves can be helpful to support the thumb joint especially if hurting with certain activities.  - Local steroid injection is an option if symptoms become worse and not controlled by the above options.

## 2021-10-07 ENCOUNTER — Ambulatory Visit: Payer: Medicare Other | Admitting: Psychology

## 2021-10-19 ENCOUNTER — Ambulatory Visit: Payer: Medicare Other | Admitting: Occupational Therapy

## 2021-10-20 ENCOUNTER — Ambulatory Visit: Payer: Medicare Other | Admitting: Internal Medicine

## 2021-10-22 ENCOUNTER — Ambulatory Visit (INDEPENDENT_AMBULATORY_CARE_PROVIDER_SITE_OTHER): Payer: Medicare Other | Admitting: Psychology

## 2021-10-22 DIAGNOSIS — Z20822 Contact with and (suspected) exposure to covid-19: Secondary | ICD-10-CM | POA: Diagnosis not present

## 2021-10-22 DIAGNOSIS — F4321 Adjustment disorder with depressed mood: Secondary | ICD-10-CM | POA: Diagnosis not present

## 2021-10-22 NOTE — Progress Notes (Addendum)
Progress Note Date: 10/22/2021  Diagnosis O87.86 (Uncomplicated bereavement) [n/a]  F43.21 (Adjustment Disorder, With depressed mood) [n/a]  F41.1 (Generalized anxiety disorder) [n/a]  Symptoms Excessive and/or unrealistic worry that is difficult to control occurring more days than not for at least 6 months about a number of events or activities. (Status: maintained) -- No Description Entered  Strong emotional response of sadness exhibited when losses are discussed. (Status: maintained) -- No Description Entered  Thoughts dominated by loss coupled with poor concentration, tearful spells, and confusion about the future. (Status: maintained) -- No Description Entered  Medication Status na  Safety none  If Suicidal or Homicidal State Action Taken: unspecified  Current Risk: low Medications unspecified Objectives Related Problem: Complete the process of letting go of the lost significant other. Description: Verbalize resolution of feelings of guilt and regret associated with the loss. Target Date: 2021-11-23 Frequency: Daily Modality: individual Progress: 80%  Related Problem: Complete the process of letting go of the lost significant other. Description: Participate in a therapy that addresses issues beyond grief that have arisen as a result of the loss. Target Date: 2021-11-23 Frequency: Daily Modality: individual Progress: 50%  Related Problem: Learn and implement coping skills that result in a reduction of anxiety and worry, and improved daily functioning. Description: Learn and implement problem-solving strategies for realistically addressing worries. Target Date: 2021-11-23 Frequency: Daily Modality: individual Progress: 50%  Related Problem: Learn and implement coping skills that result in a reduction of anxiety and worry, and improved daily functioning. Description: Learn and implement calming skills to reduce overall anxiety and manage anxiety symptoms. Target Date:  2021-11-23 Frequency: Daily Modality: individual Progress: 40%  Related Problem: Learn and implement coping skills that result in a reduction of anxiety and worry, and improved daily functioning. Description: Describe situations, thoughts, feelings, and actions associated with anxieties and worries, their impact on functioning, and attempts to resolve them. Target Date: 2021-11-23 Frequency: Daily Modality: individual Progress: 70%  Client Response full compliance  Service Location Location, [address] [place], [place] [address]  Service Code cpt [phone]  Lifestyle change (exercise, nutrition)  Self care activities  Provide education, information  Identify/label emotions  Facilitate problem solving  Normalize/Reframe  Validate/empathize  Related past to present  Comments  Adj. Disorder with Depression  Meds.: none  Goals: Spirit lost her husband suddenly several years ago when he collapsed while jogging. She has struggled to adjust to this traumatic loss, which has left a devastating void in her life. She is hoping to use counseling as a way to re-define her life and find both purpose and happiness. She relied on husband for many things and she tends to suffer from a lack of confidence. She also relied on him to co-parent their daughter, who has some special needs and presents her with multiple challenges. She is also seeking additional guidance on parenting this adult daughter. Needs to adjust to having her daughter live with her, likely for the long-term. Goal date 1-23.  Patient agreed to a video session due to the Coronavirus. She is at her home and I am at my home office.   Shakya says she prefers to have session in the office so she can have greater privacy. Told her we will consider in January. She says it has been a couple of difficult weeks. Kit had an MRI because of her results of genetic testing. Needed to be sedated for the procedure due to her seizures.  Kit has  been very unreasonable in her  expectations of what others should do for her during the holidays. Prudence has been very stressed and there has been considerable conflict among all family members in trying to determine plans. We talked about how much of Ajahnae's time is consumed by Kit and how unfair it is to [place]. It is also unfair to [place]. I told her that she needs to make choices to satisfy her own needs. She cries as she talks about spending her life taking care of others. Solstice continues to fear Kit's anger and does whatever it takes to accomodate and calm Kit down. Will focus on self-care moving forward.    [name], PhD Time:5:00p-5:55p 55 minutes

## 2021-10-26 ENCOUNTER — Other Ambulatory Visit: Payer: Self-pay

## 2021-10-26 ENCOUNTER — Ambulatory Visit: Payer: Medicare Other | Attending: Internal Medicine | Admitting: Occupational Therapy

## 2021-10-26 DIAGNOSIS — R278 Other lack of coordination: Secondary | ICD-10-CM

## 2021-10-26 DIAGNOSIS — M25642 Stiffness of left hand, not elsewhere classified: Secondary | ICD-10-CM

## 2021-10-26 DIAGNOSIS — M79642 Pain in left hand: Secondary | ICD-10-CM

## 2021-10-26 DIAGNOSIS — M25641 Stiffness of right hand, not elsewhere classified: Secondary | ICD-10-CM

## 2021-10-26 DIAGNOSIS — M79641 Pain in right hand: Secondary | ICD-10-CM

## 2021-10-26 DIAGNOSIS — M6281 Muscle weakness (generalized): Secondary | ICD-10-CM

## 2021-10-26 NOTE — Therapy (Signed)
[facility] [address] [place], [place], [address] Phone: [phone]   Fax:  [phone]  Occupational Therapy Evaluation  Patient Details  Name: Brenda Hudson MRN: [id number] Date of Birth: 1950-12-15 Referring Provider (OT): Dr. Vernelle Emerald   Encounter Date: 10/26/2021   OT End of Session - 10/26/21 1414     Visit Number 1    Number of Visits 13    Date for OT Re-Evaluation 12/25/20    Authorization Type MCR    Progress Note Due on Visit 10    OT Start Time 0803    OT Stop Time 0845    OT Time Calculation (min) 42 min    Activity Tolerance Patient tolerated treatment well    Behavior During Therapy [facility] for tasks assessed/performed             Past Medical History:  Diagnosis Date   Allergy    Anxiety    Arthritis    Diverticulosis    Frequent PVCs    and PAC per pt/has had along time   GERD (gastroesophageal reflux disease)    Heart murmur    Hx of echocardiogram    a. Echo 12/13:  EF 55-60%, mild MR, normal diast fxn, no MVP   IBS (irritable bowel syndrome)    MVP (mitral valve prolapse)    Osteoporosis    Palpitations    Polymyalgia rheumatica (HCC)    Rectal pain    Spinal stenosis     Past Surgical History:  Procedure Laterality Date   COLONOSCOPY     DILATION AND CURETTAGE OF UTERUS     TONSILLECTOMY     TUBAL LIGATION      There were no vitals filed for this visit.   Subjective Assessment - 10/26/21 0807     Pertinent History OA multiple sites, spinal stenosis, h/o PMR, IBS    Currently in Pain? Yes    Pain Score 5     Pain Location Hand    Pain Orientation Right;Left    Pain Descriptors / Indicators Aching    Pain Type Chronic pain    Pain Onset More than a month ago    Pain Frequency Constant    Aggravating Factors  cold weather    Pain Relieving Factors voltaren gel, advil               [facility] OT Assessment - 10/26/21 0001       Assessment    Medical Diagnosis OA bilateral hands    Referring Provider (OT) Dr. Vernelle Emerald    Onset Date/Surgical Date --   worse over the last year, going on for several years   Hand Dominance Right    Prior Therapy none for hands      Precautions   Precautions None      Restrictions   Weight Bearing Restrictions No      Balance Screen   Has the patient fallen in the past 6 months Yes    How many times? 1      Home  Environment   Additional Comments Pt's daughter staying with her, 2 story home but bedroom/bathroom on first floor    Lives With Daughter      Prior Function   Level of Independence Independent    Vocation Retired   Chief Technology Officer   Leisure walking/hiking, reading      ADL   Eating/Feeding Independent    Grooming Independent    Upper  Body Bathing Independent    Lower Body Bathing Independent    Upper Body Dressing Increased time   difficulty w/ buttons, zippers   Lower Body Dressing Modified independent   difficulty w/ zippers and buttons   Toilet Transfer Independent    Toileting - Clothing Manipulation Independent    Toileting -  Product/process development scientist Independent    ADL comments writing difficult w/ decr legibility      IADL   Shopping Takes care of all shopping needs independently    [place] house alone or with occasional assistance    Meal Prep Plans, prepares and serves adequate meals independently   difficulty/pain chopping/mincing, difficulty holding pot, unable to open jar w/o AE   Programmer, applications own vehicle    Medication Management Is responsible for taking medication in correct dosages at correct time   Difficulty opening medicine bottles     Mobility   Mobility Status Independent      Written Expression   Dominant Hand Right    Handwriting --   decreased legibility w/ pain     Vision - History   Baseline Vision Wears glasses all the time      Cognition   Overall Cognitive Status  Within Functional Limits for tasks assessed      Observation/Other Assessments   Observations Noted MP swelling bilateral hands, Rt hand MP ulnar drift noted, Lt long finger pain worse than anywhere in hands, but reports lost more function Rt hand      Sensation   Light Touch Appears Intact      Coordination   9 Hole Peg Test Right;Left    Right 9 Hole Peg Test 40.50 sec    Left 9 Hole Peg Test 25.32 sec    Coordination Pt reports anything fine motor difficult or unable (threading needle)      Edema   Edema mild bilateral hands, worse at MP joints      ROM / Strength   AROM / PROM / Strength AROM      AROM   Overall AROM Comments BUE AROM WNL's      Hand Function   Right Hand Grip (lbs) 38.5 lbs    Right Hand Lateral Pinch 7 lbs    Right Hand 3 Point Pinch 7 lbs    Left Hand Grip (lbs) 32.8 lbs    Left Hand Lateral Pinch 7 lbs    Left 3 point pinch 6 lbs    Comment zig zag deformity of Rt thumb only noted w/ lateral pinch                              OT Education - 10/26/21 1427     Education Details OT POC, introduction to splinting options    Person(s) Educated Patient    Methods Explanation    Comprehension Verbalized understanding              OT Short Term Goals - 10/26/21 1421       OT SHORT TERM GOAL #1   Title Independent with HEP for ROM    Time 3    Period Weeks    Status New      OT SHORT TERM GOAL #2   Title Pt to verbalize understanding with task modifications and A/E recommendations for ADLs/functional tasks (including writing, opening jars/cans, chopping, etc)    Time 3    Period  Weeks    Status New      OT SHORT TERM GOAL #3   Title Pt to verbalize understanding with joint protection techniques and pain management strategies including splinting and use of heat modalities    Time 3    Period Weeks    Status New               OT Long Term Goals - 10/26/21 1423       OT LONG TERM GOAL #1   Title  Independent with strengthening HEP for bilateral hands    Time 6    Period Weeks    Status New      OT LONG TERM GOAL #2   Title Pt to improve coordination Rt hand as evidenced by performing 9 hole peg test in 30 sec or less    Baseline 40.50 sec    Time 6    Period Weeks    Status New      OT LONG TERM GOAL #3   Title Pt to improve bilateral grip strength by 5 lbs or more    Baseline Rt = 38.5 lbs, Lt = 32.8 lbs    Time 6    Period Weeks    Status New      OT LONG TERM GOAL #4   Title Pt to improve bilateral 3 tip and lateral pinch by 2 lbs    Baseline Rt = 7 lbs for both, Lt = 7 lbs lat, 6 lbs for 3 tip    Time 6    Period Weeks    Status New                   Plan - 10/26/21 1415     Clinical Impression Statement Pt is a 70 y.o. female who presents to OPOT w/ OA bilateral hands and decreased strength. Pt also has OA multiple sites including neck, back, shoulders, and knees w/ PMH: polymyalgia rheumatica, IBS. Pt presents today with increased pain, decreased strength bilateral hands and decreased coordination Rt hand. Noted MP drift Rt hand, but reports more pain Lt hand along long finger and extensor tendon of long finger dorsally. Pt would benefit from O.T. to address these deficits, as well as education in joint protection techniques, task modifications, A/E recommendations and pain management.    OT Occupational Profile and History Problem Focused Assessment - Including review of records relating to presenting problem    Occupational performance deficits (Please refer to evaluation for details): IADL's    Body Structure / Function / Physical Skills Coordination;UE functional use;Pain;IADL;Dexterity;ROM;Strength;Decreased knowledge of use of DME    Rehab Potential Fair   chronic   Comorbidities Affecting Occupational Performance: May have comorbidities impacting occupational performance    Modification or Assistance to Complete Evaluation  No modification of tasks or  assist necessary to complete eval    OT Frequency 2x / week    OT Duration 6 weeks   (over 8 weeks timeframe d/t potential scheduling conflicts)   OT Treatment/Interventions Self-care/ADL training;Paraffin;Therapeutic exercise;DME and/or AE instruction;Fluidtherapy;Splinting;Manual Therapy;Moist Heat;Passive range of motion;Therapeutic activities;Patient/family education    Plan order splint for Rt hand MP drift, paraffin, joint protection techniques and A/E recommendations    Consulted and Agree with Plan of Care Patient             Patient will benefit from skilled therapeutic intervention in order to improve the following deficits and impairments:   Body Structure / Function / Physical Skills: Coordination,  UE functional use, Pain, IADL, Dexterity, ROM, Strength, Decreased knowledge of use of DME       Visit Diagnosis: Pain in left hand  Pain in right hand  Stiffness of right hand, not elsewhere classified  Stiffness of left hand, not elsewhere classified  Muscle weakness (generalized)  Other lack of coordination    Problem List Patient Active Problem List   Diagnosis Date Noted   Generalized osteoarthritis of multiple sites 09/30/2021   Migraine 03/13/2020   Osteopenia 01/02/2020   Headache, unspecified 08/21/2019   Spinal stenosis of lumbar region with neurogenic claudication 08/21/2019   Cervical spinal stenosis 08/21/2019   Impacted cerumen of left ear 06/20/2019   PVC's (premature ventricular contractions) 04/04/2018   Low back pain 09/30/2014   IBS (irritable bowel syndrome) 09/30/2014   Mitral valve prolapse 09/30/2014   Palpitations 09/10/2014   PAC (premature atrial contraction) 09/10/2014   History of polymyalgia rheumatica 03/21/2008    Carey Bullocks, OTR/L 10/26/2021, 2:28 PM  [facility] [address] [place], [place], [address] Phone: [phone]   Fax:   [phone]  Name: Brenda Hudson MRN: [id number] Date of Birth: May 02, 1951

## 2021-11-02 ENCOUNTER — Ambulatory Visit: Payer: Medicare Other | Admitting: Occupational Therapy

## 2021-11-03 ENCOUNTER — Ambulatory Visit: Payer: Medicare Other | Admitting: Occupational Therapy

## 2021-11-04 ENCOUNTER — Ambulatory Visit (INDEPENDENT_AMBULATORY_CARE_PROVIDER_SITE_OTHER): Payer: Medicare Other | Admitting: Psychology

## 2021-11-04 DIAGNOSIS — F4321 Adjustment disorder with depressed mood: Secondary | ICD-10-CM | POA: Diagnosis not present
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
## 2021-11-10 ENCOUNTER — Ambulatory Visit: Payer: Medicare Other | Admitting: Occupational Therapy

## 2021-11-12 ENCOUNTER — Ambulatory Visit: Payer: Medicare Other | Admitting: Occupational Therapy

## 2021-11-17 ENCOUNTER — Other Ambulatory Visit: Payer: Self-pay

## 2021-11-17 ENCOUNTER — Ambulatory Visit: Payer: Medicare Other | Attending: Internal Medicine | Admitting: Occupational Therapy

## 2021-11-17 DIAGNOSIS — M79641 Pain in right hand: Secondary | ICD-10-CM

## 2021-11-17 DIAGNOSIS — M6281 Muscle weakness (generalized): Secondary | ICD-10-CM

## 2021-11-17 DIAGNOSIS — M79642 Pain in left hand: Secondary | ICD-10-CM | POA: Diagnosis not present

## 2021-11-17 DIAGNOSIS — M25642 Stiffness of left hand, not elsewhere classified: Secondary | ICD-10-CM | POA: Diagnosis not present

## 2021-11-17 DIAGNOSIS — M25641 Stiffness of right hand, not elsewhere classified: Secondary | ICD-10-CM | POA: Diagnosis not present

## 2021-11-17 DIAGNOSIS — R278 Other lack of coordination: Secondary | ICD-10-CM

## 2021-11-17 NOTE — Therapy (Signed)
[facility] [address] [place], [place], [address] Phone: [phone]   Fax:  [phone]  Occupational Therapy Treatment  Patient Details  Name: Brenda Hudson MRN: [id number] Date of Birth: Oct 07, 1951 Referring Provider (OT): Dr. Vernelle Emerald   Encounter Date: 11/17/2021   OT End of Session - 11/17/21 0811     Visit Number 2    Number of Visits 13    Date for OT Re-Evaluation 12/25/20    Authorization Type MCR    Progress Note Due on Visit 10    OT Start Time 0805    OT Stop Time 0845    OT Time Calculation (min) 40 min             Past Medical History:  Diagnosis Date   Allergy    Anxiety    Arthritis    Diverticulosis    Frequent PVCs    and PAC per pt/has had along time   GERD (gastroesophageal reflux disease)    Heart murmur    Hx of echocardiogram    a. Echo 12/13:  EF 55-60%, mild MR, normal diast fxn, no MVP   IBS (irritable bowel syndrome)    MVP (mitral valve prolapse)    Osteoporosis    Palpitations    Polymyalgia rheumatica (HCC)    Rectal pain    Spinal stenosis     Past Surgical History:  Procedure Laterality Date   COLONOSCOPY     DILATION AND CURETTAGE OF UTERUS     TONSILLECTOMY     TUBAL LIGATION      There were no vitals filed for this visit.   Subjective Assessment - 11/17/21 0810     Subjective  Pt reports left hand pain    Pertinent History OA multiple sites, spinal stenosis, h/o PMR, IBS    Currently in Pain? Yes    Pain Score 5     Pain Location Hand    Pain Orientation Left    Pain Descriptors / Indicators Aching    Pain Type Chronic pain    Pain Onset More than a month ago    Pain Frequency Constant    Aggravating Factors  cold weather    Pain Relieving Factors voltaren, advil                    Treatment: Paraffin x 10 mins to bilateral UE's for pain and stiffness. No adverse reactions. Therapist showed pt several options to  prevent MP drift for RUE. Pt chose softer option and therapist measured pt for splint. Therapist to order. Education initiated regarding AE : Pt was provided with pictures of key grips and button hook. Pt practiced using buttonhook with increased ease following instruction Pt trialed several grips for writing: pen again, foam grip and coban wrap. Pt prefers coban wrap grip and pt was provided with coban wrap. Therapist also recommended pt considers good grips or xoxo knives with larger grips for home use for increased ease and prevention of deformity.                 OT Short Term Goals - 10/26/21 1421       OT SHORT TERM GOAL #1   Title Independent with HEP for ROM    Time 3    Period Weeks    Status New      OT SHORT TERM GOAL #2   Title Pt to verbalize understanding with task modifications and A/E recommendations  for ADLs/functional tasks (including writing, opening jars/cans, chopping, etc)    Time 3    Period Weeks    Status New      OT SHORT TERM GOAL #3   Title Pt to verbalize understanding with joint protection techniques and pain management strategies including splinting and use of heat modalities    Time 3    Period Weeks    Status New               OT Long Term Goals - 10/26/21 1423       OT LONG TERM GOAL #1   Title Independent with strengthening HEP for bilateral hands    Time 6    Period Weeks    Status New      OT LONG TERM GOAL #2   Title Pt to improve coordination Rt hand as evidenced by performing 9 hole peg test in 30 sec or less    Baseline 40.50 sec    Time 6    Period Weeks    Status New      OT LONG TERM GOAL #3   Title Pt to improve bilateral grip strength by 5 lbs or more    Baseline Rt = 38.5 lbs, Lt = 32.8 lbs    Time 6    Period Weeks    Status New      OT LONG TERM GOAL #4   Title Pt to improve bilateral 3 tip and lateral pinch by 2 lbs    Baseline Rt = 7 lbs for both, Lt = 7 lbs lat, 6 lbs for 3 tip    Time 6     Period Weeks    Status New                   Plan - 11/17/21 4580     Clinical Impression Statement Pt is progressing towards goals.she reports decreased pain following use of paraffin.    OT Occupational Profile and History Problem Focused Assessment - Including review of records relating to presenting problem    Occupational performance deficits (Please refer to evaluation for details): IADL's    Body Structure / Function / Physical Skills Coordination;UE functional use;Pain;IADL;Dexterity;ROM;Strength;Decreased knowledge of use of DME    Rehab Potential Fair   chronic   Comorbidities Affecting Occupational Performance: May have comorbidities impacting occupational performance    Modification or Assistance to Complete Evaluation  No modification of tasks or assist necessary to complete eval    OT Frequency 2x / week    OT Duration 6 weeks   (over 8 weeks timeframe d/t potential scheduling conflicts)   OT Treatment/Interventions Self-care/ADL training;Paraffin;Therapeutic exercise;DME and/or AE instruction;Fluidtherapy;Splinting;Manual Therapy;Moist Heat;Passive range of motion;Therapeutic activities;Patient/family education    Plan paraffin, issue inital HEP, continue joint protection techniques and A/E recommendations,    Consulted and Agree with Plan of Care Patient             Patient will benefit from skilled therapeutic intervention in order to improve the following deficits and impairments:   Body Structure / Function / Physical Skills: Coordination, UE functional use, Pain, IADL, Dexterity, ROM, Strength, Decreased knowledge of use of DME       Visit Diagnosis: Pain in left hand  Stiffness of right hand, not elsewhere classified  Stiffness of left hand, not elsewhere classified  Muscle weakness (generalized)  Other lack of coordination  Pain in right hand    Problem List Patient Active Problem List   Diagnosis  Date Noted   Generalized osteoarthritis  of multiple sites 09/30/2021   Migraine 03/13/2020   Osteopenia 01/02/2020   Headache, unspecified 08/21/2019   Spinal stenosis of lumbar region with neurogenic claudication 08/21/2019   Cervical spinal stenosis 08/21/2019   Impacted cerumen of left ear 06/20/2019   PVC's (premature ventricular contractions) 04/04/2018   Low back pain 09/30/2014   IBS (irritable bowel syndrome) 09/30/2014   Mitral valve prolapse 09/30/2014   Palpitations 09/10/2014   PAC (premature atrial contraction) 09/10/2014   History of polymyalgia rheumatica 03/21/2008    Steadman Prosperi, OT 11/17/2021, 8:58 AM  [facility] [address] [place], [place], [address] Phone: [phone]   Fax:  [phone]  Name: Brenda Hudson MRN: [id number] Date of Birth: 06/05/1951

## 2021-11-19 ENCOUNTER — Ambulatory Visit: Payer: Medicare Other | Admitting: Occupational Therapy

## 2021-11-19 ENCOUNTER — Ambulatory Visit: Payer: Medicare Other | Admitting: Psychology

## 2021-11-20 ENCOUNTER — Telehealth: Payer: Self-pay | Admitting: Physician Assistant

## 2021-11-20 DIAGNOSIS — I493 Ventricular premature depolarization: Secondary | ICD-10-CM

## 2021-11-20 MED ORDER — FLECAINIDE ACETATE 100 MG PO TABS: 100.0000 mg | ORAL_TABLET | Freq: Two times a day (BID) | ORAL | 2 refills | Status: DC

## 2021-11-20 NOTE — Telephone Encounter (Signed)
Pt's medication was sent to pt's pharmacy as requested. Confirmation received.  °

## 2021-11-20 NOTE — Telephone Encounter (Signed)
°*  STAT* If patient is at the pharmacy, call can be transferred to refill team.   1. Which medications need to be refilled? (please list name of each medication and dose if known) new prescription for Flecainide  2. Which pharmacy/location (including street and city if local pharmacy) is medication to be sent to?[facility] [address] [facility] [address] [place]  3. Do they need a 30 day or 90 day supply? 90 days, please call today if possible please, no medicine after today

## 2021-11-23 ENCOUNTER — Ambulatory Visit: Payer: Medicare Other | Admitting: Occupational Therapy

## 2021-11-23 ENCOUNTER — Other Ambulatory Visit: Payer: Self-pay

## 2021-11-23 DIAGNOSIS — M79641 Pain in right hand: Secondary | ICD-10-CM | POA: Diagnosis not present

## 2021-11-23 DIAGNOSIS — R278 Other lack of coordination: Secondary | ICD-10-CM | POA: Diagnosis not present

## 2021-11-23 DIAGNOSIS — M79642 Pain in left hand: Secondary | ICD-10-CM | POA: Diagnosis not present

## 2021-11-23 DIAGNOSIS — M25642 Stiffness of left hand, not elsewhere classified: Secondary | ICD-10-CM

## 2021-11-23 DIAGNOSIS — M6281 Muscle weakness (generalized): Secondary | ICD-10-CM | POA: Diagnosis not present

## 2021-11-23 DIAGNOSIS — M25641 Stiffness of right hand, not elsewhere classified: Secondary | ICD-10-CM

## 2021-11-23 NOTE — Patient Instructions (Addendum)
Flexor Tendon Gliding (Active Full Fist)    Straighten all fingers, then make a fist, bending all joints. Repeat _10___ times. Do __3__ sessions per day.  Opposition (Active)    Touch tip of thumb to nail tip of each finger in turn, making an "O" shape. Keep "O" big Repeat __10__ times. Do _3___ sessions per day.    Palmar Adduction/Abduction (Active)    Move thumb in front of palm (like a backwards "C" or "L"). Move back to rest along palm. Repeat __10__ times. Do _3___ sessions per day.   Radial Finger Walk (Active to Counteract Ulnar Deviation    With palm flat on table and held steady, slide fingers one by one towards thumb. Hold fingers in position and lift entire hand up from table to reposition for next repetition. NEVER go towards small finger.  Repeat __10__ times. Do __3__ sessions per day.

## 2021-11-23 NOTE — Therapy (Signed)
[facility] [address] [place], [place], [address] Phone: [phone]   Fax:  [phone]  Occupational Therapy Treatment  Patient Details  Name: Brenda Hudson MRN: [id number] Date of Birth: Feb 05, 1951 Referring Provider (OT): Dr. Vernelle Emerald   Encounter Date: 11/23/2021   OT End of Session - 11/23/21 0819     Visit Number 3    Number of Visits 13    Date for OT Re-Evaluation 12/25/20    Authorization Type MCR    Progress Note Due on Visit 10    OT Start Time 0810   pt arrived late   OT Stop Time 0848    OT Time Calculation (min) 38 min    Activity Tolerance Patient tolerated treatment well    Behavior During Therapy [facility] for tasks assessed/performed             Past Medical History:  Diagnosis Date   Allergy    Anxiety    Arthritis    Diverticulosis    Frequent PVCs    and PAC per pt/has had along time   GERD (gastroesophageal reflux disease)    Heart murmur    Hx of echocardiogram    a. Echo 12/13:  EF 55-60%, mild MR, normal diast fxn, no MVP   IBS (irritable bowel syndrome)    MVP (mitral valve prolapse)    Osteoporosis    Palpitations    Polymyalgia rheumatica (HCC)    Rectal pain    Spinal stenosis     Past Surgical History:  Procedure Laterality Date   COLONOSCOPY     DILATION AND CURETTAGE OF UTERUS     TONSILLECTOMY     TUBAL LIGATION      There were no vitals filed for this visit.   Subjective Assessment - 11/23/21 0816     Subjective  Last time, she ordered me a splint    Pertinent History OA multiple sites, spinal stenosis, h/o PMR, IBS    Currently in Pain? Yes    Pain Score 4     Pain Location Hand   Rt hand stiffness 2/10   Pain Orientation Left    Pain Descriptors / Indicators Aching    Pain Type Chronic pain    Pain Onset More than a month ago    Pain Frequency Constant    Aggravating Factors  cold weather    Pain Relieving Factors voltaren, advil                           OT Treatments/Exercises (OP) - 11/23/21 0001       ADLs   ADL Comments Further discussed A/E recommendations and task modifications to reduce pain and protect joints (including: electric can openers, jar openers, key openers, ergonomic and Rt angled knives, medicine bottle openers, choppers, etc and different ways to hold pots/pans, wring out washcloth). Pt provided handouts on A/E recommendations      Exercises   Exercises Hand      Hand Exercises   Other Hand Exercises Initiated HEP for bilateral hands including: composite flex/ext, thumb opposition and palmer abduction, and radial finger walking - most difficulty w/ last ex (due to MP ulnar drift) - to issue handout following session (due to time constraints today)      Modalities   Modalities Fluidotherapy      LUE Fluidotherapy   Number Minutes Fluidotherapy 10 Minutes    LUE Fluidotherapy Location Hand;Wrist  Comments at beginning of session to decrease stiffness   (paraffin not available today - room occupied)                   OT Education - 11/23/21 0940     Education Details A/E recommendations, task modifications, initial HEP    Person(s) Educated Patient    Methods Explanation;Handout;Demonstration   handouts for A/E only (handouts for HEP to be issued next session)   Comprehension Verbalized understanding;Returned demonstration              OT Short Term Goals - 11/23/21 0820       OT SHORT TERM GOAL #1   Title Independent with HEP for ROM    Time 3    Period Weeks    Status On-going      OT SHORT TERM GOAL #2   Title Pt to verbalize understanding with task modifications and A/E recommendations for ADLs/functional tasks (including writing, opening jars/cans, chopping, etc)    Time 3    Period Weeks    Status On-going      OT SHORT TERM GOAL #3   Title Pt to verbalize understanding with joint protection techniques and pain management strategies including  splinting and use of heat modalities    Time 3    Period Weeks    Status On-going               OT Long Term Goals - 10/26/21 1423       OT LONG TERM GOAL #1   Title Independent with strengthening HEP for bilateral hands    Time 6    Period Weeks    Status New      OT LONG TERM GOAL #2   Title Pt to improve coordination Rt hand as evidenced by performing 9 hole peg test in 30 sec or less    Baseline 40.50 sec    Time 6    Period Weeks    Status New      OT LONG TERM GOAL #3   Title Pt to improve bilateral grip strength by 5 lbs or more    Baseline Rt = 38.5 lbs, Lt = 32.8 lbs    Time 6    Period Weeks    Status New      OT LONG TERM GOAL #4   Title Pt to improve bilateral 3 tip and lateral pinch by 2 lbs    Baseline Rt = 7 lbs for both, Lt = 7 lbs lat, 6 lbs for 3 tip    Time 6    Period Weeks    Status New                   Plan - 11/23/21 1141     Clinical Impression Statement Pt is progressing towards goals. Pt w/ greater understanding of joint protection strategies and task modifications and A/E to help protect joints    OT Occupational Profile and History Problem Focused Assessment - Including review of records relating to presenting problem    Occupational performance deficits (Please refer to evaluation for details): IADL's    Body Structure / Function / Physical Skills Coordination;UE functional use;Pain;IADL;Dexterity;ROM;Strength;Decreased knowledge of use of DME    Rehab Potential Fair   chronic   Comorbidities Affecting Occupational Performance: May have comorbidities impacting occupational performance    Modification or Assistance to Complete Evaluation  No modification of tasks or assist necessary to complete eval    OT  Frequency 2x / week    OT Duration 6 weeks   (over 8 weeks timeframe d/t potential scheduling conflicts)   OT Treatment/Interventions Self-care/ADL training;Paraffin;Therapeutic exercise;DME and/or AE  instruction;Fluidtherapy;Splinting;Manual Therapy;Moist Heat;Passive range of motion;Therapeutic activities;Patient/family education    Plan paraffin, issue inital HEP handout and review, continue joint protection techniques and A/E recommendations prn, begin light strengthening as tolerated, check on if splint arrived    Consulted and Agree with Plan of Care Patient             Patient will benefit from skilled therapeutic intervention in order to improve the following deficits and impairments:   Body Structure / Function / Physical Skills: Coordination, UE functional use, Pain, IADL, Dexterity, ROM, Strength, Decreased knowledge of use of DME       Visit Diagnosis: Pain in left hand  Stiffness of left hand, not elsewhere classified  Stiffness of right hand, not elsewhere classified  Muscle weakness (generalized)  Pain in right hand    Problem List Patient Active Problem List   Diagnosis Date Noted   Generalized osteoarthritis of multiple sites 09/30/2021   Migraine 03/13/2020   Osteopenia 01/02/2020   Headache, unspecified 08/21/2019   Spinal stenosis of lumbar region with neurogenic claudication 08/21/2019   Cervical spinal stenosis 08/21/2019   Impacted cerumen of left ear 06/20/2019   PVC's (premature ventricular contractions) 04/04/2018   Low back pain 09/30/2014   IBS (irritable bowel syndrome) 09/30/2014   Mitral valve prolapse 09/30/2014   Palpitations 09/10/2014   PAC (premature atrial contraction) 09/10/2014   History of polymyalgia rheumatica 03/21/2008    Carey Bullocks, OTR/L 11/23/2021, 11:42 AM  [facility] [address] [place], [place], [address] Phone: [phone]   Fax:  [phone]  Name: Brenda Hudson MRN: [id number] Date of Birth: 1951-05-02

## 2021-11-25 ENCOUNTER — Ambulatory Visit: Payer: Medicare Other | Admitting: Occupational Therapy

## 2021-11-25 ENCOUNTER — Ambulatory Visit (INDEPENDENT_AMBULATORY_CARE_PROVIDER_SITE_OTHER): Payer: Medicare Other | Admitting: Psychology

## 2021-11-25 ENCOUNTER — Other Ambulatory Visit: Payer: Self-pay

## 2021-11-25 DIAGNOSIS — M25642 Stiffness of left hand, not elsewhere classified: Secondary | ICD-10-CM | POA: Diagnosis not present

## 2021-11-25 DIAGNOSIS — M6281 Muscle weakness (generalized): Secondary | ICD-10-CM | POA: Diagnosis not present

## 2021-11-25 DIAGNOSIS — M25641 Stiffness of right hand, not elsewhere classified: Secondary | ICD-10-CM | POA: Diagnosis not present

## 2021-11-25 DIAGNOSIS — F4321 Adjustment disorder with depressed mood: Secondary | ICD-10-CM

## 2021-11-25 DIAGNOSIS — M79642 Pain in left hand: Secondary | ICD-10-CM

## 2021-11-25 DIAGNOSIS — M79641 Pain in right hand: Secondary | ICD-10-CM | POA: Diagnosis not present

## 2021-11-25 DIAGNOSIS — R278 Other lack of coordination: Secondary | ICD-10-CM | POA: Diagnosis not present

## 2021-11-25 NOTE — Progress Notes (Signed)
KAMARI BILEK is a 71 y.o. female patient    Date: 11/25/2021  Treatment Plan: Diagnosis O96.29 (Uncomplicated bereavement) [n/a]  F43.21 (Adjustment Disorder, With depressed mood) [n/a]  F41.1 (Generalized anxiety disorder) [n/a]  Symptoms Excessive and/or unrealistic worry that is difficult to control occurring more days than not for at least 6 months about a number of events or activities. (Status: maintained) -- No Description Entered  Strong emotional response of sadness exhibited when losses are discussed. (Status: maintained) -- No Description Entered  Thoughts dominated by loss coupled with poor concentration, tearful spells, and confusion about the future. (Status: maintained) -- No Description Entered  Medication Status na  Safety none  If Suicidal or Homicidal State Action Taken: unspecified  Current Risk: low Medications unspecified Objectives Related Problem: Complete the process of letting go of the lost significant other. Description: Verbalize resolution of feelings of guilt and regret associated with the loss. Target Date: 2021-11-23 Frequency: Daily Modality: individual Progress: 80%  Related Problem: Complete the process of letting go of the lost significant other. Description: Participate in a therapy that addresses issues beyond grief that have arisen as a result of the loss. Target Date: 2021-11-23 Frequency: Daily Modality: individual Progress: 50%  Related Problem: Learn and implement coping skills that result in a reduction of anxiety and worry, and improved daily functioning. Description: Learn and implement problem-solving strategies for realistically addressing worries. Target Date: 2021-11-23 Frequency: Daily Modality: individual Progress: 50%  Related Problem: Learn and implement coping skills that result in a reduction of anxiety and worry, and improved daily functioning. Description: Learn and implement calming skills to reduce overall  anxiety and manage anxiety symptoms. Target Date: 2021-11-23 Frequency: Daily Modality: individual Progress: 40%  Related Problem: Learn and implement coping skills that result in a reduction of anxiety and worry, and improved daily functioning. Description: Describe situations, thoughts, feelings, and actions associated with anxieties and worries, their impact on functioning, and attempts to resolve them. Target Date: 2021-11-23 Frequency: Daily Modality: individual Progress: 70%  Client Response full compliance  Service Location Location, [address] [place], [place] [address]  Service Code cpt [phone]  Lifestyle change (exercise, nutrition)  Self care activities  Provide education, information  Identify/label emotions  Facilitate problem solving  Normalize/Reframe  Validate/empathize  Related past to present  Comments  Adj. Disorder with Depression  Meds.: none  Goals: Delonda lost her husband suddenly several years ago when he collapsed while jogging. She has struggled to adjust to this traumatic loss, which has left a devastating void in her life. She is hoping to use counseling as a way to re-define her life and find both purpose and happiness. She relied on husband for many things and she tends to suffer from a lack of confidence. She also relied on him to co-parent their daughter, who has some special needs and presents her with multiple challenges. She is also seeking additional guidance on parenting this adult daughter. Needs to adjust to having her daughter live with her, likely for the long-term. Goal date 1-23.  Patient agreed to a video session due to the Coronavirus. She is at her home and I am at my home office.   Kaysha states that the holiday went well, but was "low key" . It was just she and Kit on Christmas and then [name] and family came on the following Monday. The surrogate for Kit has now been approved and now there are many legal steps to take before  any medical procedures.  Morgane says she is overwhelmed with all that has to be done and what will be coming. We discussed the impact oh her and her daughter [name] when Kit has the baby.  Richard, Bill's brother was visiting. Corabelle has a strong relationship with him. He shared a lot with Jakaylee about his [place], which gave Alleyne insight into Bill's personality.She said Rush Landmark was chronically anxious and the only time she saw him angry was at Kit. Janaria says that she feels she lives a "double life" because only a limited number of people know what they have dealt with and are currently dealing with. Keonda admits she is "scared to death" of Kit and her reactions. She feared Kit's temper and her reactions. Talked about need to set time with [name] and grandchildren, but she is fearful of Kit's response. Working to empower her to assert herself with Kit.            [name], PhD Time:5:05p-6:00p 55 minutes

## 2021-11-25 NOTE — Therapy (Signed)
[facility] [address] [place], [place], [address] Phone: [phone]   Fax:  [phone]  Occupational Therapy Treatment  Patient Details  Name: Brenda Hudson MRN: [id number] Date of Birth: [birth year] Referring Provider (OT): Dr. Vernelle Emerald   Encounter Date: 11/25/2021   OT End of Session - 11/25/21 0812     Visit Number 4    Number of Visits 13    Date for OT Re-Evaluation 12/25/20    Authorization Type MCR    Progress Note Due on Visit 10    OT Start Time 0800    OT Stop Time 0845    OT Time Calculation (min) 45 min    Activity Tolerance Patient tolerated treatment well    Behavior During Therapy [facility] for tasks assessed/performed             Past Medical History:  Diagnosis Date   Allergy    Anxiety    Arthritis    Diverticulosis    Frequent PVCs    and PAC per pt/has had along time   GERD (gastroesophageal reflux disease)    Heart murmur    Hx of echocardiogram    a. Echo 12/13:  EF 55-60%, mild MR, normal diast fxn, no MVP   IBS (irritable bowel syndrome)    MVP (mitral valve prolapse)    Osteoporosis    Palpitations    Polymyalgia rheumatica (HCC)    Rectal pain    Spinal stenosis     Past Surgical History:  Procedure Laterality Date   COLONOSCOPY     DILATION AND CURETTAGE OF UTERUS     TONSILLECTOMY     TUBAL LIGATION      There were no vitals filed for this visit.   Subjective Assessment - 11/25/21 0809     Pertinent History OA multiple sites, spinal stenosis, h/o PMR, IBS    Currently in Pain? Yes    Pain Score 4     Pain Location Hand   Rt hand 2/10   Pain Orientation Left    Pain Descriptors / Indicators Aching    Pain Type Chronic pain    Pain Onset More than a month ago    Pain Frequency Constant    Aggravating Factors  cold weather    Pain Relieving Factors voltaren, advit             Paraffin x 10 min bilateral hands to decreased stiffness and  pain.   Reviewed A/ROM HEP thoroughly and pt demo each   Pt issued soft neoprene style splint to prevent MP drift Rt hand, however noted skin discoloration/poor circulation Rt index finger only. Adjusted for looser fit and cut neoprene where it hits at base of index finger which helped but still demo potential discoloration at tip of index finger. Pt reports she has Raynauds. Recommended pt closely monitor - only to try for short durations during the day, but if it does discolor index finger or cause any pain or numbness, to remove promptly. Pt agreed. (May try to just cut piece that wraps around index finger next session if not better)                       OT Short Term Goals - 11/25/21 0816       OT SHORT TERM GOAL #1   Title Independent with HEP for ROM    Time 3    Period Weeks    Status  Achieved      OT SHORT TERM GOAL #2   Title Pt to verbalize understanding with task modifications and A/E recommendations for ADLs/functional tasks (including writing, opening jars/cans, chopping, etc)    Time 3    Period Weeks    Status Achieved      OT SHORT TERM GOAL #3   Title Pt to verbalize understanding with joint protection techniques and pain management strategies including splinting and use of heat modalities    Time 3    Period Weeks    Status On-going               OT Long Term Goals - 10/26/21 1423       OT LONG TERM GOAL #1   Title Independent with strengthening HEP for bilateral hands    Time 6    Period Weeks    Status New      OT LONG TERM GOAL #2   Title Pt to improve coordination Rt hand as evidenced by performing 9 hole peg test in 30 sec or less    Baseline 40.50 sec    Time 6    Period Weeks    Status New      OT LONG TERM GOAL #3   Title Pt to improve bilateral grip strength by 5 lbs or more    Baseline Rt = 38.5 lbs, Lt = 32.8 lbs    Time 6    Period Weeks    Status New      OT LONG TERM GOAL #4   Title Pt to improve bilateral  3 tip and lateral pinch by 2 lbs    Baseline Rt = 7 lbs for both, Lt = 7 lbs lat, 6 lbs for 3 tip    Time 6    Period Weeks    Status New                   Plan - 11/25/21 0906     Clinical Impression Statement Pt has met STG #1 and #2. Pt progressing towards remaining STG    OT Occupational Profile and History Problem Focused Assessment - Including review of records relating to presenting problem    Occupational performance deficits (Please refer to evaluation for details): IADL's    Body Structure / Function / Physical Skills Coordination;UE functional use;Pain;IADL;Dexterity;ROM;Strength;Decreased knowledge of use of DME    Rehab Potential Fair   chronic   Comorbidities Affecting Occupational Performance: May have comorbidities impacting occupational performance    Modification or Assistance to Complete Evaluation  No modification of tasks or assist necessary to complete eval    OT Frequency 2x / week    OT Duration 6 weeks   (over 8 weeks timeframe d/t potential scheduling conflicts)   OT Treatment/Interventions Self-care/ADL training;Paraffin;Therapeutic exercise;DME and/or AE instruction;Fluidtherapy;Splinting;Manual Therapy;Moist Heat;Passive range of motion;Therapeutic activities;Patient/family education    Plan continue paraffin, reassess pre-fab splint, issue arthritis booklet    Consulted and Agree with Plan of Care Patient             Patient will benefit from skilled therapeutic intervention in order to improve the following deficits and impairments:   Body Structure / Function / Physical Skills: Coordination, UE functional use, Pain, IADL, Dexterity, ROM, Strength, Decreased knowledge of use of DME       Visit Diagnosis: Pain in left hand  Stiffness of left hand, not elsewhere classified  Stiffness of right hand, not elsewhere classified  Muscle weakness (generalized)  Pain in right hand    Problem List Patient Active Problem List   Diagnosis  Date Noted   Generalized osteoarthritis of multiple sites 09/30/2021   Migraine 03/13/2020   Osteopenia 01/02/2020   Headache, unspecified 08/21/2019   Spinal stenosis of lumbar region with neurogenic claudication 08/21/2019   Cervical spinal stenosis 08/21/2019   Impacted cerumen of left ear 06/20/2019   PVC's (premature ventricular contractions) 04/04/2018   Low back pain 09/30/2014   IBS (irritable bowel syndrome) 09/30/2014   Mitral valve prolapse 09/30/2014   Palpitations 09/10/2014   PAC (premature atrial contraction) 09/10/2014   History of polymyalgia rheumatica 03/21/2008    Carey Bullocks, OTR/L 11/25/2021, 9:15 AM  [facility] [address] [place], [place], [address] Phone: [phone]   Fax:  [phone]  Name: WILMARIE SPARLIN MRN: [id number] Date of Birth: 1951/05/31

## 2021-12-02 ENCOUNTER — Ambulatory Visit: Payer: Medicare Other | Admitting: Occupational Therapy

## 2021-12-02 ENCOUNTER — Other Ambulatory Visit: Payer: Self-pay

## 2021-12-02 DIAGNOSIS — M6281 Muscle weakness (generalized): Secondary | ICD-10-CM | POA: Diagnosis not present

## 2021-12-02 DIAGNOSIS — R278 Other lack of coordination: Secondary | ICD-10-CM | POA: Diagnosis not present

## 2021-12-02 DIAGNOSIS — M25641 Stiffness of right hand, not elsewhere classified: Secondary | ICD-10-CM | POA: Diagnosis not present

## 2021-12-02 DIAGNOSIS — M79642 Pain in left hand: Secondary | ICD-10-CM | POA: Diagnosis not present

## 2021-12-02 DIAGNOSIS — M25642 Stiffness of left hand, not elsewhere classified: Secondary | ICD-10-CM

## 2021-12-02 DIAGNOSIS — M79641 Pain in right hand: Secondary | ICD-10-CM | POA: Diagnosis not present

## 2021-12-02 NOTE — Patient Instructions (Signed)
°  1. Grip Strengthening (Resistive Putty)   Squeeze putty using thumb and all fingers. Repeat _15___ times. Do __2__ sessions per day.   2. Roll putty into tube on table and pinch between first two fingers and thumb x 10 reps. Do 2 sessions per day. Make sure to keep natural curve in fingers and thumb    3. MP Flexion (Resistive Putty)    Bending only at large knuckles, press putty down against thumb. Pull putty apart like taffy with both hands.  Repeat _10___ times. Do __2__ sessions per day. Watch thumb position and big knucles

## 2021-12-02 NOTE — Therapy (Signed)
[facility] [address] [place], [place], [address] Phone: [phone]   Fax:  [phone]  Occupational Therapy Treatment  Patient Details  Name: Brenda Hudson MRN: [id number] Date of Birth: Jul 25, 1951 Referring Provider (OT): Dr. Vernelle Emerald   Encounter Date: 12/02/2021   OT End of Session - 12/02/21 0817     Visit Number 5    Number of Visits 13    Date for OT Re-Evaluation 12/25/20    Authorization Type MCR    Progress Note Due on Visit 10    OT Start Time 0805    OT Stop Time 0845    OT Time Calculation (min) 40 min    Activity Tolerance Patient tolerated treatment well    Behavior During Therapy [facility] for tasks assessed/performed             Past Medical History:  Diagnosis Date   Allergy    Anxiety    Arthritis    Diverticulosis    Frequent PVCs    and PAC per pt/has had along time   GERD (gastroesophageal reflux disease)    Heart murmur    Hx of echocardiogram    a. Echo 12/13:  EF 55-60%, mild MR, normal diast fxn, no MVP   IBS (irritable bowel syndrome)    MVP (mitral valve prolapse)    Osteoporosis    Palpitations    Polymyalgia rheumatica (HCC)    Rectal pain    Spinal stenosis     Past Surgical History:  Procedure Laterality Date   COLONOSCOPY     DILATION AND CURETTAGE OF UTERUS     TONSILLECTOMY     TUBAL LIGATION      There were no vitals filed for this visit.   Subjective Assessment - 12/02/21 0816     Subjective  The splint isn't working too well - I can't get the velcro off myself because my other hand is so weak    Pertinent History OA multiple sites, spinal stenosis, h/o PMR, IBS    Currently in Pain? Yes    Pain Score 4     Pain Location Hand    Pain Orientation Right;Left   left 4/10, right 3/10   Pain Descriptors / Indicators Aching    Pain Type Chronic pain    Pain Onset More than a month ago    Pain Frequency Constant    Aggravating Factors  cold  weather    Pain Relieving Factors voltaren, advit                          OT Treatments/Exercises (OP) - 12/02/21 0001       ADLs   ADL Comments Pt issued handout on other options for splints to prevent MP ulnar drift and where to purchase if desired. Pt issued arthritis booklet and reviewed joint protection strategies, task modifications, etc.      Hand Exercises   Other Hand Exercises Pt issued putty HEP - see pt instructions. Issued red resistance putty      RUE Fluidotherapy   Number Minutes Fluidotherapy 10 Minutes    RUE Fluidotherapy Location Hand;Wrist    Comments beginning of session (P)       LUE Fluidotherapy   Number Minutes Fluidotherapy 10 Minutes    LUE Fluidotherapy Location Hand;Wrist    Comments simultaneously with Rt (P)             (Paraffin unavailable today)  OT Education - 12/02/21 0846     Education Details Putty HEP    Person(s) Educated Patient    Methods Explanation;Handout;Demonstration;Verbal cues    Comprehension Verbalized understanding;Returned demonstration              OT Short Term Goals - 12/02/21 0850       OT SHORT TERM GOAL #1   Title Independent with HEP for ROM    Time 3    Period Weeks    Status Achieved      OT SHORT TERM GOAL #2   Title Pt to verbalize understanding with task modifications and A/E recommendations for ADLs/functional tasks (including writing, opening jars/cans, chopping, etc)    Time 3    Period Weeks    Status Achieved      OT SHORT TERM GOAL #3   Title Pt to verbalize understanding with joint protection techniques and pain management strategies including splinting and use of heat modalities    Time 3    Period Weeks    Status Achieved               OT Long Term Goals - 12/02/21 0850       OT LONG TERM GOAL #1   Title Independent with strengthening HEP for bilateral hands    Time 6    Period Weeks    Status On-going      OT LONG TERM GOAL #2    Title Pt to improve coordination Rt hand as evidenced by performing 9 hole peg test in 30 sec or less    Baseline 40.50 sec    Time 6    Period Weeks    Status New      OT LONG TERM GOAL #3   Title Pt to improve bilateral grip strength by 5 lbs or more    Baseline Rt = 38.5 lbs, Lt = 32.8 lbs    Time 6    Period Weeks    Status New      OT LONG TERM GOAL #4   Title Pt to improve bilateral 3 tip and lateral pinch by 2 lbs    Baseline Rt = 7 lbs for both, Lt = 7 lbs lat, 6 lbs for 3 tip    Time 6    Period Weeks    Status New                   Plan - 12/02/21 0850     Clinical Impression Statement Pt has met all STG's at this time. Pt progressing towards LTG's    OT Occupational Profile and History Problem Focused Assessment - Including review of records relating to presenting problem    Occupational performance deficits (Please refer to evaluation for details): IADL's    Body Structure / Function / Physical Skills Coordination;UE functional use;Pain;IADL;Dexterity;ROM;Strength;Decreased knowledge of use of DME    Rehab Potential Fair   chronic   Comorbidities Affecting Occupational Performance: May have comorbidities impacting occupational performance    Modification or Assistance to Complete Evaluation  No modification of tasks or assist necessary to complete eval    OT Frequency 2x / week    OT Duration 6 weeks   (over 8 weeks timeframe d/t potential scheduling conflicts)   OT Treatment/Interventions Self-care/ADL training;Paraffin;Therapeutic exercise;DME and/or AE instruction;Fluidtherapy;Splinting;Manual Therapy;Moist Heat;Passive range of motion;Therapeutic activities;Patient/family education    Plan continue paraffin, review putty HEP    Consulted and Agree with Plan of Care Patient  Patient will benefit from skilled therapeutic intervention in order to improve the following deficits and impairments:   Body Structure / Function / Physical Skills:  Coordination, UE functional use, Pain, IADL, Dexterity, ROM, Strength, Decreased knowledge of use of DME       Visit Diagnosis: Pain in left hand  Stiffness of left hand, not elsewhere classified  Stiffness of right hand, not elsewhere classified  Pain in right hand  Muscle weakness (generalized)    Problem List Patient Active Problem List   Diagnosis Date Noted   Generalized osteoarthritis of multiple sites 09/30/2021   Migraine 03/13/2020   Osteopenia 01/02/2020   Headache, unspecified 08/21/2019   Spinal stenosis of lumbar region with neurogenic claudication 08/21/2019   Cervical spinal stenosis 08/21/2019   Impacted cerumen of left ear 06/20/2019   PVC's (premature ventricular contractions) 04/04/2018   Low back pain 09/30/2014   IBS (irritable bowel syndrome) 09/30/2014   Mitral valve prolapse 09/30/2014   Palpitations 09/10/2014   PAC (premature atrial contraction) 09/10/2014   History of polymyalgia rheumatica 03/21/2008    Carey Bullocks, OTR/L 12/02/2021, 8:51 AM  [facility] [address] [place], [place], [address] Phone: [phone]   Fax:  [phone]  Name: SELENI MELLER MRN: [id number] Date of Birth: 1951/01/08

## 2021-12-07 ENCOUNTER — Ambulatory Visit: Payer: Medicare Other | Admitting: Occupational Therapy

## 2021-12-07 ENCOUNTER — Other Ambulatory Visit: Payer: Self-pay

## 2021-12-07 DIAGNOSIS — M79641 Pain in right hand: Secondary | ICD-10-CM | POA: Diagnosis not present

## 2021-12-07 DIAGNOSIS — M79642 Pain in left hand: Secondary | ICD-10-CM

## 2021-12-07 DIAGNOSIS — M6281 Muscle weakness (generalized): Secondary | ICD-10-CM | POA: Diagnosis not present

## 2021-12-07 DIAGNOSIS — M25642 Stiffness of left hand, not elsewhere classified: Secondary | ICD-10-CM

## 2021-12-07 DIAGNOSIS — R278 Other lack of coordination: Secondary | ICD-10-CM | POA: Diagnosis not present

## 2021-12-07 DIAGNOSIS — M25641 Stiffness of right hand, not elsewhere classified: Secondary | ICD-10-CM

## 2021-12-07 NOTE — Therapy (Signed)
[facility] [address] [place], [place], [address] Phone: [phone]   Fax:  [phone]  Occupational Therapy Treatment  Patient Details  Name: Brenda Hudson MRN: [id number] Date of Birth: [birth year] Referring Provider (OT): Dr. Vernelle Emerald   Encounter Date: 12/07/2021   OT End of Session - 12/07/21 0818     Visit Number 6    Number of Visits 13    Date for OT Re-Evaluation 12/25/20    Authorization Type MCR    Progress Note Due on Visit 10    OT Start Time 0808    OT Stop Time 0845    OT Time Calculation (min) 37 min    Activity Tolerance Patient tolerated treatment well    Behavior During Therapy [facility] for tasks assessed/performed             Past Medical History:  Diagnosis Date   Allergy    Anxiety    Arthritis    Diverticulosis    Frequent PVCs    and PAC per pt/has had along time   GERD (gastroesophageal reflux disease)    Heart murmur    Hx of echocardiogram    a. Echo 12/13:  EF 55-60%, mild MR, normal diast fxn, no MVP   IBS (irritable bowel syndrome)    MVP (mitral valve prolapse)    Osteoporosis    Palpitations    Polymyalgia rheumatica (HCC)    Rectal pain    Spinal stenosis     Past Surgical History:  Procedure Laterality Date   COLONOSCOPY     DILATION AND CURETTAGE OF UTERUS     TONSILLECTOMY     TUBAL LIGATION      There were no vitals filed for this visit.   Subjective Assessment - 12/07/21 0817     Subjective  --    Pertinent History OA multiple sites, spinal stenosis, h/o PMR, IBS    Currently in Pain? Yes    Pain Score 3     Pain Location Hand    Pain Orientation Right;Left   Lt > Rt   Pain Descriptors / Indicators Aching    Pain Type Chronic pain    Pain Onset More than a month ago    Pain Frequency Constant    Aggravating Factors  cold weather    Pain Relieving Factors voltaren, advit                          OT  Treatments/Exercises (OP) - 12/07/21 0001       Hand Exercises   Other Hand Exercises reviewed putty HEP - pt required extensive review to perform correctly and protect joints while doing    Other Hand Exercises Coordination activity for Rt hand - copying small peg design w/ occasional assist from Lt hand. Pt also shown in hand manipulation task to perform with coins (pt reports it got more difficult w/ repetition - ? Due to fatigue/pain)     Modalities   Modalities Paraffin      RUE Paraffin   Number Minutes Paraffin 10 Minutes    RUE Paraffin Location Hand    Comments at beginning of session      LUE Paraffin   Number Minutes Paraffin 10 Minutes    LUE Paraffin Location Hand    Comments simultaneously with Rt                    OT  Education - 12/07/21 [phone]     Education Details Reviewed putty HEP, encouraged pt to pick up pennies/small items with Rt hand for practice    Person(s) Educated Patient    Methods Explanation;Demonstration;Verbal cues    Comprehension Verbalized understanding;Returned demonstration              OT Short Term Goals - 12/02/21 0850       OT SHORT TERM GOAL #1   Title Independent with HEP for ROM    Time 3    Period Weeks    Status Achieved      OT SHORT TERM GOAL #2   Title Pt to verbalize understanding with task modifications and A/E recommendations for ADLs/functional tasks (including writing, opening jars/cans, chopping, etc)    Time 3    Period Weeks    Status Achieved      OT SHORT TERM GOAL #3   Title Pt to verbalize understanding with joint protection techniques and pain management strategies including splinting and use of heat modalities    Time 3    Period Weeks    Status Achieved               OT Long Term Goals - 12/07/21 0840       OT LONG TERM GOAL #1   Title Independent with strengthening HEP for bilateral hands    Time 6    Period Weeks    Status Achieved      OT LONG TERM GOAL #2   Title Pt  to improve coordination Rt hand as evidenced by performing 9 hole peg test in 30 sec or less    Baseline 40.50 sec    Time 6    Period Weeks    Status On-going      OT LONG TERM GOAL #3   Title Pt to improve bilateral grip strength by 5 lbs or more    Baseline Rt = 38.5 lbs, Lt = 32.8 lbs    Time 6    Period Weeks    Status On-going      OT LONG TERM GOAL #4   Title Pt to improve bilateral 3 tip and lateral pinch by 2 lbs    Baseline Rt = 7 lbs for both, Lt = 7 lbs lat, 6 lbs for 3 tip    Time 6    Period Weeks    Status On-going                   Plan - 12/07/21 0840     Clinical Impression Statement Pt has met all STG's and 1 LTG. Pt progressing towards remaining LTG's    OT Occupational Profile and History Problem Focused Assessment - Including review of records relating to presenting problem    Occupational performance deficits (Please refer to evaluation for details): IADL's    Body Structure / Function / Physical Skills Coordination;UE functional use;Pain;IADL;Dexterity;ROM;Strength;Decreased knowledge of use of DME    Rehab Potential Fair   chronic   Comorbidities Affecting Occupational Performance: May have comorbidities impacting occupational performance    Modification or Assistance to Complete Evaluation  No modification of tasks or assist necessary to complete eval    OT Frequency 2x / week    OT Duration 6 weeks   (over 8 weeks timeframe d/t potential scheduling conflicts)   OT Treatment/Interventions Self-care/ADL training;Paraffin;Therapeutic exercise;DME and/or AE instruction;Fluidtherapy;Splinting;Manual Therapy;Moist Heat;Passive range of motion;Therapeutic activities;Patient/family education    Plan continue paraffin, assess remaining LTG's, potential  d/c next visit or 2    Consulted and Agree with Plan of Care Patient             Patient will benefit from skilled therapeutic intervention in order to improve the following deficits and  impairments:   Body Structure / Function / Physical Skills: Coordination, UE functional use, Pain, IADL, Dexterity, ROM, Strength, Decreased knowledge of use of DME       Visit Diagnosis: Pain in left hand  Pain in right hand  Stiffness of left hand, not elsewhere classified  Stiffness of right hand, not elsewhere classified  Muscle weakness (generalized)    Problem List Patient Active Problem List   Diagnosis Date Noted   Generalized osteoarthritis of multiple sites 09/30/2021   Migraine 03/13/2020   Osteopenia 01/02/2020   Headache, unspecified 08/21/2019   Spinal stenosis of lumbar region with neurogenic claudication 08/21/2019   Cervical spinal stenosis 08/21/2019   Impacted cerumen of left ear 06/20/2019   PVC's (premature ventricular contractions) 04/04/2018   Low back pain 09/30/2014   IBS (irritable bowel syndrome) 09/30/2014   Mitral valve prolapse 09/30/2014   Palpitations 09/10/2014   PAC (premature atrial contraction) 09/10/2014   History of polymyalgia rheumatica 03/21/2008    Carey Bullocks, OTR/L 12/07/2021, 10:20 AM  [facility] [address] [place], [place], [address] Phone: [phone]   Fax:  [phone]  Name: JINNIE ONLEY MRN: [id number] Date of Birth: 1951/04/12

## 2021-12-09 ENCOUNTER — Other Ambulatory Visit: Payer: Self-pay

## 2021-12-09 ENCOUNTER — Ambulatory Visit: Payer: Medicare Other | Admitting: Occupational Therapy

## 2021-12-09 ENCOUNTER — Ambulatory Visit (INDEPENDENT_AMBULATORY_CARE_PROVIDER_SITE_OTHER): Payer: Medicare Other | Admitting: Psychology

## 2021-12-09 DIAGNOSIS — M25642 Stiffness of left hand, not elsewhere classified: Secondary | ICD-10-CM | POA: Diagnosis not present

## 2021-12-09 DIAGNOSIS — M6281 Muscle weakness (generalized): Secondary | ICD-10-CM

## 2021-12-09 DIAGNOSIS — M79641 Pain in right hand: Secondary | ICD-10-CM

## 2021-12-09 DIAGNOSIS — M79642 Pain in left hand: Secondary | ICD-10-CM | POA: Diagnosis not present

## 2021-12-09 DIAGNOSIS — M25641 Stiffness of right hand, not elsewhere classified: Secondary | ICD-10-CM | POA: Diagnosis not present

## 2021-12-09 DIAGNOSIS — R278 Other lack of coordination: Secondary | ICD-10-CM | POA: Diagnosis not present

## 2021-12-09 DIAGNOSIS — F4321 Adjustment disorder with depressed mood: Secondary | ICD-10-CM | POA: Diagnosis not present

## 2021-12-09 NOTE — Progress Notes (Addendum)
Brenda Hudson is a 71 y.o. female patient    Date: 12/09/2021  Treatment Plan: Diagnosis G62.69 (Uncomplicated bereavement) [n/a]  F43.21 (Adjustment Disorder, With depressed mood) [n/a]  F41.1 (Generalized anxiety disorder) [n/a]  Symptoms Excessive and/or unrealistic worry that is difficult to control occurring more days than not for at least 6 months about a number of events or activities. (Status: maintained) -- No Description Entered  Strong emotional response of sadness exhibited when losses are discussed. (Status: maintained) -- No Description Entered  Thoughts dominated by loss coupled with poor concentration, tearful spells, and confusion about the future. (Status: maintained) -- No Description Entered  Medication Status na  Safety none  If Suicidal or Homicidal State Action Taken: unspecified  Current Risk: low Medications unspecified Objectives Related Problem: Complete the process of letting go of the lost significant other. Description: Verbalize resolution of feelings of guilt and regret associated with the loss. Target Date: 2022-06-23 Frequency: Daily Modality: individual Progress: 80%  Related Problem: Complete the process of letting go of the lost significant other. Description: Participate in a therapy that addresses issues beyond grief that have arisen as a result of the loss. Target Date: 2022-06-23 Frequency: Daily Modality: individual Progress: 50%  Related Problem: Learn and implement coping skills that result in a reduction of anxiety and worry, and improved daily functioning. Description: Learn and implement problem-solving strategies for realistically addressing worries. Target Date: 2022-06-23 Frequency: Daily Modality: individual Progress: 50%  Related Problem: Learn and implement coping skills that result in a reduction of anxiety and worry, and improved daily functioning. Description: Learn and implement calming skills to reduce overall  anxiety and manage anxiety symptoms. Target Date: 2022-06-23 Frequency: Daily Modality: individual Progress: 40%  Related Problem: Learn and implement coping skills that result in a reduction of anxiety and worry, and improved daily functioning. Description: Describe situations, thoughts, feelings, and actions associated with anxieties and worries, their impact on functioning, and attempts to resolve them. Target Date: 2022-06-23 Frequency: Daily Modality: individual Progress: 70%  Client Response full compliance  Service Location Location, [address] [place], [place] [address]  Service Code cpt [id number]  Lifestyle change (exercise, nutrition)  Self care activities  Provide education, information  Identify/label emotions  Facilitate problem solving  Normalize/Reframe  Validate/empathize  Related past to present  Comments  Adj. Disorder with Depression  Meds.: none  Goals: Brenda Hudson lost her husband suddenly several years ago when he collapsed while jogging. She has struggled to adjust to this traumatic loss, which has left a devastating void in her life. She is hoping to use counseling as a way to re-define her life and find both purpose and happiness. She relied on husband for many things and she tends to suffer from a lack of confidence. She also relied on him to co-parent their daughter, who has some special needs and presents her with multiple challenges. She is also seeking additional guidance on parenting this adult daughter. Needs to adjust to having her daughter live with her, likely for the long-term. Goal date 8-23.  Patient agreed to a video session due to the Coronavirus. She is at her home and I am at my home office.   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 Brenda Hudson say "I am excited for her, but I am also stressed out". She feels, however, she "owes" this to Brenda Hudson. Brenda Hudson continually reminds  Brenda Hudson of how she was failed by her and Bill. Brenda Hudson  tells her that she has done very little in the past 15 years to educate herself about what she has experienced. Brenda Hudson realizes she is being manipulated by Brenda Hudson but does not feel control. Brenda Hudson is now having to work out a will and Brenda Hudson tells her that she needs to leave everything to her. She realizes that it is completely unfair and would devastate Brenda Hudson. Brenda Hudson is wanting to take over everything that Brenda Hudson owns. I told her that she needs to talk with her attorney, but that she needs to consider what is fair in her mind. Talked with her about the need for her to assert herself with Brenda Hudson and not allow herself to be mistreated.               [name], PhD Time:5:15p-6:00p 67 minutes                     [name], PhD

## 2021-12-09 NOTE — Therapy (Signed)
[facility] [address] [place], [place], [address] Phone: [phone]   Fax:  [phone]  Occupational Therapy Treatment  Patient Details  Name: Brenda Hudson MRN: [id number] Date of Birth: [birth year] Referring Provider (OT): Dr. Vernelle Emerald   Encounter Date: 12/09/2021   OT End of Session - 12/09/21 0839     Visit Number 7    Number of Visits 13    Date for OT Re-Evaluation 12/25/20    Authorization Type MCR    Progress Note Due on Visit 10    OT Start Time 0800    OT Stop Time 0840    OT Time Calculation (min) 40 min    Activity Tolerance Patient tolerated treatment well    Behavior During Therapy [facility] for tasks assessed/performed             Past Medical History:  Diagnosis Date   Allergy    Anxiety    Arthritis    Diverticulosis    Frequent PVCs    and PAC per pt/has had along time   GERD (gastroesophageal reflux disease)    Heart murmur    Hx of echocardiogram    a. Echo 12/13:  EF 55-60%, mild MR, normal diast fxn, no MVP   IBS (irritable bowel syndrome)    MVP (mitral valve prolapse)    Osteoporosis    Palpitations    Polymyalgia rheumatica (HCC)    Rectal pain    Spinal stenosis     Past Surgical History:  Procedure Laterality Date   COLONOSCOPY     DILATION AND CURETTAGE OF UTERUS     TONSILLECTOMY     TUBAL LIGATION      There were no vitals filed for this visit.   Subjective Assessment - 12/09/21 0813     Pertinent History OA multiple sites, spinal stenosis, h/o PMR, IBS    Currently in Pain? Yes    Pain Score 3    Lt hand > Rt hand   Pain Orientation Right;Left    Pain Descriptors / Indicators Aching    Pain Type Chronic pain    Pain Onset More than a month ago    Pain Frequency Constant    Aggravating Factors  cold weather    Pain Relieving Factors voltaren, advit                          OT Treatments/Exercises (OP) - 12/09/21 0001        RUE Paraffin   Number Minutes Paraffin 10 Minutes    RUE Paraffin Location Hand    Comments at beginning of session      LUE Paraffin   Number Minutes Paraffin 10 Minutes    LUE Paraffin Location Hand    Comments simultaneously with Rt hand            Assessed goals and progress to date - see LTG section.  Reviewed A/E recommendations and joint protection strategies.  Clothespin activity for pinch strength (Yellow to green resistance) Rt and Lt hands. Attempted gripper activity but d/c due to increased MP ulnar drift.          OT Short Term Goals - 12/02/21 0850       OT SHORT TERM GOAL #1   Title Independent with HEP for ROM    Time 3    Period Weeks    Status Achieved      OT SHORT TERM  GOAL #2   Title Pt to verbalize understanding with task modifications and A/E recommendations for ADLs/functional tasks (including writing, opening jars/cans, chopping, etc)    Time 3    Period Weeks    Status Achieved      OT SHORT TERM GOAL #3   Title Pt to verbalize understanding with joint protection techniques and pain management strategies including splinting and use of heat modalities    Time 3    Period Weeks    Status Achieved               OT Long Term Goals - 12/09/21 0831       OT LONG TERM GOAL #1   Title Independent with strengthening HEP for bilateral hands    Time 6    Period Weeks    Status Achieved      OT LONG TERM GOAL #2   Title Pt to improve coordination Rt hand as evidenced by performing 9 hole peg test in 30 sec or less    Baseline 40.50 sec    Time 6    Period Weeks    Status Achieved   21.65 sec     OT LONG TERM GOAL #3   Title Pt to improve bilateral grip strength by 5 lbs or more    Baseline Rt = 38.5 lbs, Lt = 32.8 lbs    Time 6    Period Weeks    Status Achieved   Rt = 48.2 lbs, Lt = 38.5 lbs     OT LONG TERM GOAL #4   Title Pt to improve bilateral 3 tip and lateral pinch by 2 lbs    Baseline Rt = 7 lbs for both, Lt  = 7 lbs lat, 6 lbs for 3 tip    Time 6    Period Weeks    Status Partially Met   Lat: Rt = 10, Lt = 12.  3 tip: RT = 9, Lt = 7                  Plan - 12/09/21 0834     Clinical Impression Statement Pt has met all goals at this time. Pt doing well with all education    OT Occupational Profile and History Problem Focused Assessment - Including review of records relating to presenting problem    Occupational performance deficits (Please refer to evaluation for details): IADL's    Body Structure / Function / Physical Skills Coordination;UE functional use;Pain;IADL;Dexterity;ROM;Strength;Decreased knowledge of use of DME    Rehab Potential Fair   chronic   Comorbidities Affecting Occupational Performance: May have comorbidities impacting occupational performance    Modification or Assistance to Complete Evaluation  No modification of tasks or assist necessary to complete eval    OT Frequency 2x / week    OT Duration 6 weeks   (over 8 weeks timeframe d/t potential scheduling conflicts)   OT Treatment/Interventions Self-care/ADL training;Paraffin;Therapeutic exercise;DME and/or AE instruction;Fluidtherapy;Splinting;Manual Therapy;Moist Heat;Passive range of motion;Therapeutic activities;Patient/family education    Plan D/C O.T.    Consulted and Agree with Plan of Care Patient             Patient will benefit from skilled therapeutic intervention in order to improve the following deficits and impairments:   Body Structure / Function / Physical Skills: Coordination, UE functional use, Pain, IADL, Dexterity, ROM, Strength, Decreased knowledge of use of DME       Visit Diagnosis: Pain in left hand  Pain in right  hand  Stiffness of left hand, not elsewhere classified  Stiffness of right hand, not elsewhere classified  Muscle weakness (generalized)    Problem List Patient Active Problem List   Diagnosis Date Noted   Generalized osteoarthritis of multiple sites  09/30/2021   Migraine 03/13/2020   Osteopenia 01/02/2020   Headache, unspecified 08/21/2019   Spinal stenosis of lumbar region with neurogenic claudication 08/21/2019   Cervical spinal stenosis 08/21/2019   Impacted cerumen of left ear 06/20/2019   PVC's (premature ventricular contractions) 04/04/2018   Low back pain 09/30/2014   IBS (irritable bowel syndrome) 09/30/2014   Mitral valve prolapse 09/30/2014   Palpitations 09/10/2014   PAC (premature atrial contraction) 09/10/2014   History of polymyalgia rheumatica 03/21/2008    OCCUPATIONAL THERAPY DISCHARGE SUMMARY  Visits from Start of Care: 7  Current functional level related to goals / functional outcomes: See above   Remaining deficits: Mild pain bilateral hands Mild strength deficits (however improved)   Education / Equipment: HEP, joint protection techniques, task modification and A/E recommendations   Patient agrees to discharge. Patient goals were met. Patient is being discharged due to maximized rehab potential. .    Carey Bullocks, OTR/L 12/09/2021, 8:41 AM  [facility] [address] [place], [place], [address] Phone: [phone]   Fax:  [phone]  Name: Brenda Hudson MRN: [id number] Date of Birth: 12-05-1950

## 2021-12-21 ENCOUNTER — Encounter: Payer: Medicare Other | Admitting: Occupational Therapy

## 2021-12-23 ENCOUNTER — Ambulatory Visit: Payer: Medicare Other | Admitting: Psychology

## 2021-12-23 ENCOUNTER — Encounter: Payer: Medicare Other | Admitting: Occupational Therapy

## 2022-01-07 ENCOUNTER — Ambulatory Visit (INDEPENDENT_AMBULATORY_CARE_PROVIDER_SITE_OTHER): Payer: Medicare Other | Admitting: Psychology

## 2022-01-07 DIAGNOSIS — F4321 Adjustment disorder with depressed mood: Secondary | ICD-10-CM | POA: Diagnosis not present
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
## 2022-01-11 DIAGNOSIS — Z20822 Contact with and (suspected) exposure to covid-19: Secondary | ICD-10-CM | POA: Diagnosis not present

## 2022-01-14 ENCOUNTER — Ambulatory Visit (INDEPENDENT_AMBULATORY_CARE_PROVIDER_SITE_OTHER): Payer: Medicare Other | Admitting: Psychology

## 2022-01-14 DIAGNOSIS — F4321 Adjustment disorder with depressed mood: Secondary | ICD-10-CM | POA: Diagnosis not present
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
## 2022-01-28 ENCOUNTER — Ambulatory Visit: Payer: Medicare Other | Admitting: Psychology

## 2022-02-03 DIAGNOSIS — Z803 Family history of malignant neoplasm of breast: Secondary | ICD-10-CM | POA: Diagnosis not present

## 2022-02-03 DIAGNOSIS — Z8481 Family history of carrier of genetic disease: Secondary | ICD-10-CM | POA: Diagnosis not present

## 2022-02-11 ENCOUNTER — Ambulatory Visit (INDEPENDENT_AMBULATORY_CARE_PROVIDER_SITE_OTHER): Payer: Medicare Other | Admitting: Psychology

## 2022-02-11 DIAGNOSIS — F4321 Adjustment disorder with depressed mood: Secondary | ICD-10-CM | POA: Diagnosis not present
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
## 2022-02-15 ENCOUNTER — Other Ambulatory Visit: Payer: Self-pay | Admitting: Physician Assistant

## 2022-02-15 DIAGNOSIS — I341 Nonrheumatic mitral (valve) prolapse: Secondary | ICD-10-CM

## 2022-02-15 DIAGNOSIS — R002 Palpitations: Secondary | ICD-10-CM

## 2022-02-18 DIAGNOSIS — Z20822 Contact with and (suspected) exposure to covid-19: Secondary | ICD-10-CM | POA: Diagnosis not present

## 2022-02-25 ENCOUNTER — Ambulatory Visit: Payer: Medicare Other | Admitting: Psychology

## 2022-03-01 DIAGNOSIS — Z20822 Contact with and (suspected) exposure to covid-19: Secondary | ICD-10-CM | POA: Diagnosis not present

## 2022-03-11 ENCOUNTER — Ambulatory Visit: Payer: Medicare Other | Admitting: Psychology

## 2022-03-16 ENCOUNTER — Ambulatory Visit (INDEPENDENT_AMBULATORY_CARE_PROVIDER_SITE_OTHER): Payer: Medicare Other | Admitting: Psychology

## 2022-03-16 DIAGNOSIS — F4321 Adjustment disorder with depressed mood: Secondary | ICD-10-CM | POA: Diagnosis not present

## 2022-03-16 NOTE — Progress Notes (Signed)
? ? ? ? ? ? ? ? ? ? ? ? ? ? ? ?Brenda Hudson is a 71 y.o. female patient  ? ? ?Date: 03/16/2022  ?Treatment Plan: ?Diagnosis ?Y07.37 (Uncomplicated bereavement) [n/a]  ?F43.21 (Adjustment Disorder, With depressed mood) [n/a]  ?F41.1 (Generalized anxiety disorder) [n/a]  ?Symptoms ?Excessive and/or unrealistic worry that is difficult to control occurring more days than not for at least 6 months about a number of events or activities. (Status: maintained) -- No Description Entered  ?Strong emotional response of sadness exhibited when losses are discussed. (Status: maintained) -- No Description Entered  ?Thoughts dominated by loss coupled with poor concentration, tearful spells, and confusion about the future. (Status: maintained) -- No Description Entered  ?Medication Status ?na  ?Safety ?none  ?If Suicidal or Homicidal State Action Taken: unspecified  ?Current Risk: low ?Medications ?unspecified ?Objectives ?Related Problem: Complete the process of letting go of the lost significant other. ?Description: Verbalize resolution of feelings of guilt and regret associated with the loss. ?Target Date: 2022-06-23 ?Frequency: Daily ?Modality: individual ?Progress: 80%  ?Related Problem: Complete the process of letting go of the lost significant other. ?Description: Participate in a therapy that addresses issues beyond grief that have arisen as a result of the loss. ?Target Date: 2022-06-23 ?Frequency: Daily ?Modality: individual ?Progress: 50% ? ?Related Problem: Learn and implement coping skills that result in a reduction of anxiety and worry, and improved daily functioning. ?Description: Learn and implement problem-solving strategies for realistically addressing worries. ?Target Date: 2022-06-23 ?Frequency: Daily ?Modality: individual ?Progress: 50% ? ?Related Problem: Learn and implement coping skills that result in a reduction of anxiety and worry, and improved daily functioning. ?Description: Learn and implement  calming skills to reduce overall anxiety and manage anxiety symptoms. ?Target Date: 2022-06-23 ?Frequency: Daily ?Modality: individual ?Progress: 40%  ?Related Problem: Learn and implement coping skills that result in a reduction of anxiety and worry, and improved daily functioning. ?Description: Describe situations, thoughts, feelings, and actions associated with anxieties and worries, their impact on functioning, and attempts to resolve them. ?Target Date: 2022-06-23 ?Frequency: Daily ?Modality: individual ?Progress: 70%  ?Client Response ?full compliance  ?Service Location ?Location, [address] [place], [place] [address]  ?Service Code ?cpt [id number]  ?Lifestyle change (exercise, nutrition)  ?Self care activities  ?Provide education, information  ?Identify/label emotions  ?Facilitate problem solving  ?Normalize/Reframe  ?Validate/empathize  ?Related past to present  ?Comments  ?Adj. Disorder with Depression ? Meds.: none  ?Goals: Brenda Hudson lost her husband suddenly several years ago when he collapsed while jogging. She has struggled to adjust to this traumatic loss, which has left a devastating void in her life. She is hoping to use counseling as a way to re-define her life and find both purpose and happiness. She relied on husband for many things and she tends to suffer from a lack of confidence. She also relied on him to co-parent their daughter, who has some special needs and presents her with multiple challenges. She is also seeking additional guidance on parenting this adult daughter. Needs to adjust to having her daughter live with her, likely for the long-term. Goal date 8-23.  ?Patient agreed to a video session due to the Coronavirus. She is at her home and I am at my home office.  ? ?Brenda Hudson says that they had the 9th anniversary of [name]. They went to a movie and a dinner. Says Brenda Hudson was "very sweet" on that day. Brenda Hudson is recovering from her surgery and is doing well.  Brenda Hudson is worried about  Brenda Hudson's health and increasing weakness. She says that she and Brenda Hudson are having "ups and downs" and it creates considerable anxiety for her. She will talk to Dr. Yong Channel about medication. She says she cannot talk to Brenda Hudson about anything of substance without it blowing up. She says she cannot even address asking Brenda Hudson's therapist for a fee reduction. Claims " we are walking on a thin line" and is not willing to risk the emotional explosion that will happen. She feels trapped and knows things will get worse. Does not feel there is any way she can address any of the problems with Brenda Hudson. We will work on formulating a plan on how to approach Brenda Hudson. Brenda Hudson is convinced that it is not possible and not she if she wants to "take the risk".                           ? ?Brenda Morel, PhD Time:8:40p-9:30p 45 minutes ? ? ? ? ? ? ? ? ? ? ? ? ? ? ? ? ? ? ? ? ? ?

## 2022-03-18 DIAGNOSIS — L821 Other seborrheic keratosis: Secondary | ICD-10-CM | POA: Diagnosis not present

## 2022-03-18 DIAGNOSIS — L0101 Non-bullous impetigo: Secondary | ICD-10-CM | POA: Diagnosis not present

## 2022-03-18 DIAGNOSIS — L308 Other specified dermatitis: Secondary | ICD-10-CM | POA: Diagnosis not present

## 2022-03-18 DIAGNOSIS — D225 Melanocytic nevi of trunk: Secondary | ICD-10-CM | POA: Diagnosis not present

## 2022-03-18 DIAGNOSIS — L814 Other melanin hyperpigmentation: Secondary | ICD-10-CM | POA: Diagnosis not present

## 2022-03-18 DIAGNOSIS — Z808 Family history of malignant neoplasm of other organs or systems: Secondary | ICD-10-CM | POA: Diagnosis not present

## 2022-03-18 DIAGNOSIS — L570 Actinic keratosis: Secondary | ICD-10-CM | POA: Diagnosis not present

## 2022-03-18 DIAGNOSIS — I8392 Asymptomatic varicose veins of left lower extremity: Secondary | ICD-10-CM | POA: Diagnosis not present

## 2022-03-19 ENCOUNTER — Other Ambulatory Visit: Payer: Self-pay | Admitting: Family Medicine

## 2022-03-19 ENCOUNTER — Encounter: Payer: Self-pay | Admitting: Family Medicine

## 2022-03-19 ENCOUNTER — Ambulatory Visit (INDEPENDENT_AMBULATORY_CARE_PROVIDER_SITE_OTHER): Payer: Medicare Other | Admitting: Family Medicine

## 2022-03-19 VITALS — BP 108/76 | HR 59 | Temp 97.2°F | Ht 62.5 in | Wt 111.2 lb

## 2022-03-19 DIAGNOSIS — F411 Generalized anxiety disorder: Secondary | ICD-10-CM

## 2022-03-19 MED ORDER — ESCITALOPRAM OXALATE 5 MG PO TABS: 5.0000 mg | ORAL_TABLET | Freq: Every day | ORAL | 5 refills | Status: DC

## 2022-03-19 NOTE — Patient Instructions (Addendum)
Let us know when you have gotten your [facility], COVID and [place] vaccines.  ? ?Start lexapro 5 mg daily in the morning ? ?Taking the medicine as directed and not missing any doses is one of the best things you can do to treat your anxiety.  Here are some things to keep in mind: ? ?Side effects (stomach upset, some increased anxiety) may happen before you notice a benefit.  These side effects typically go away over time. ?Changes to your dose of medicine or a change in medication all together is sometimes necessary ?Most people need to be on medication at least 6-12 months ?Many people will notice an improvement within two weeks but the full effect of the medication can take up to 6-8 weeks ?Stopping the medication when you start feeling better often results in a return of symptoms ?If you start having thoughts of hurting yourself or others after starting this medicine, call our office immediately at [phone] or seek care through 911.   ? ? ?Recommended follow up: Return in about 2 months (around 05/19/2022) for followup or sooner if needed.Schedule b4 you leave.  ?

## 2022-03-19 NOTE — Progress Notes (Signed)
?Phone [phone] ?In person visit ?  ?Subjective:  ? ?Brenda Hudson is a 71 y.o. year old very pleasant female patient who presents for/with See problem oriented charting ?Chief Complaint  ?Patient presents with  ? Anxiety  ? ?Past Medical History-  ?Patient Active Problem List  ? Diagnosis Date Noted  ? Spinal stenosis of lumbar region with neurogenic claudication 08/21/2019  ?  Priority: High  ? PAC (premature atrial contraction) 09/10/2014  ?  Priority: High  ? Migraine 03/13/2020  ?  Priority: Medium   ? Osteopenia 01/02/2020  ?  Priority: Medium   ? Cervical spinal stenosis 08/21/2019  ?  Priority: Medium   ? Generalized osteoarthritis of multiple sites 09/30/2021  ?  Priority: Low  ? Headache, unspecified 08/21/2019  ?  Priority: Low  ? PVC's (premature ventricular contractions) 04/04/2018  ?  Priority: Low  ? Low back pain 09/30/2014  ?  Priority: Low  ? IBS (irritable bowel syndrome) 09/30/2014  ?  Priority: Low  ? Mitral valve prolapse 09/30/2014  ?  Priority: Low  ? Palpitations 09/10/2014  ?  Priority: Low  ? History of polymyalgia rheumatica 03/21/2008  ?  Priority: Low  ? ? ?Medications- reviewed and updated ?Current Outpatient Medications  ?Medication Sig Dispense Refill  ? ACETAMINOPHEN PO Take by mouth as needed.    ? Cholecalciferol (VITAMIN D3) 50 MCG (2000 UT) CAPS Take by mouth.    ? flecainide (TAMBOCOR) 100 MG tablet Take 1 tablet (100 mg total) by mouth 2 (two) times daily. 180 tablet 2  ? Ibuprofen 200 MG CAPS Take 400 mg by mouth in the morning and at bedtime.     ? loratadine (CLARITIN) 10 MG tablet Take 10 mg by mouth daily as needed for allergies.    ? MELATONIN PO Take by mouth.    ? metoprolol succinate (TOPROL-XL) 25 MG 24 hr tablet TAKE 1 TABLET (25 MG TOTAL) BY MOUTH DAILY. 90 tablet 1  ? ?No current facility-administered medications for this visit.  ? ?  ?Objective:  ?BP 108/76   Pulse (!) 59   Temp (!) 97.2 ?F (36.2 ?C)   Ht 5' 2.5" (1.588 m)   Wt 111 lb 3.2 oz (50.4  kg)   SpO2 98%   BMI 20.01 kg/m?  ?Gen: NAD, resting comfortably ?  ? ?Assessment and Plan  ? ?# GAD/situational anxiety ?S:Medication: None ?Counseling: working with Dr. Cheryln Manly ?-working through a lot of family issues- daughter has some serious health and life concerns. She had BRCA2- extensive surgeries for prevention, also has epilepsy.  ?-walking regularly- gets 10k steps ? ?  03/19/2022  ?  2:11 PM  ?GAD 7 : Generalized Anxiety Score  ?Nervous, Anxious, on Edge 3  ?Control/stop worrying 2  ?Worry too much - different things 3  ?Trouble relaxing 1  ?Restless 0  ?Easily annoyed or irritable 0  ?Afraid - awful might happen 0  ?Total GAD 7 Score 9  ?Anxiety Difficulty Somewhat difficult  ? ? ?  03/19/2022  ?  2:55 PM 05/11/2021  ?  9:36 AM 12/21/2019  ?  3:09 PM  ?Depression screen PHQ 2/9  ?Decreased Interest 0 0 0  ?Down, Depressed, Hopeless 1 0 0  ?PHQ - 2 Score 1 0 0  ?Altered sleeping 1    ?Tired, decreased energy 2    ?Change in appetite 0    ?Feeling bad or failure about yourself  1    ?Trouble concentrating 1    ?Moving  slowly or fidgety/restless 0    ?Suicidal thoughts 0    ?PHQ-9 Score 6    ?Difficult doing work/chores Not difficult at all    ? ? ?A/P: GAD/situational anxiety poorly controlled despite therapy with behavioral health Dr. Cheryln Manly ?- We discussed several options-initially we chose Lexapro 5 mg daily but we were contacted by pharmacy after the visit-this has Grade c interaction per up-to-date with flecainide versus grade B (preferred) with Zoloft-I am going to reach out by MyChart to see if she will be willing to try Zoloft 25 mg instead ?-45-monthfollow-up planned ?-Continue therapy visits ? ?Recommended follow up: Return in about 2 months (around 05/19/2022) for followup or sooner if needed.Schedule b4 you leave. ?Future Appointments  ?Date Time Provider DElkin ?03/25/2022  5:00 PM GOren Binet PhD [facility] None  ?04/08/2022  5:00 PM GOren Binet PhD [facility] None   ?04/22/2022  5:00 PM GOren Binet PhD [facility] None  ?05/06/2022  5:00 PM GOren Binet PhD [facility] None  ?05/14/2022  9:30 AM [facility] HEALTH COACH [facility]  ?05/20/2022  5:00 PM GOren Binet PhD [facility] None  ?05/21/2022  1:20 PM HMarin Olp MD [facility]  ?06/03/2022  5:00 PM GOren Binet PhD [facility] None  ?06/17/2022  5:00 PM GOren Binet PhD [facility] None  ?07/01/2022  5:00 PM GOren Binet PhD [facility] None  ?07/15/2022  5:00 PM GOren Binet PhD [facility] None  ?07/29/2022  5:00 PM GOren Binet PhD [facility] None  ?08/12/2022  5:00 PM GOren Binet PhD [facility] None  ?08/26/2022  5:00 PM GOren Binet PhD [facility] None  ?09/09/2022  5:00 PM GOren Binet PhD [facility] None  ?09/23/2022  5:00 PM GOren Binet PhD [facility] None  ?10/21/2022  5:00 PM GOren Binet PhD [facility] None  ?11/04/2022  5:00 PM GOren Binet PhD [facility] None  ? ? ?Lab/Order associations: ?  ICD-10-CM   ?1. GAD (generalized anxiety disorder)  F41.1   ?  ? ? ?Meds ordered this encounter  ?Medications  ? escitalopram (LEXAPRO) 5 MG tablet  ?  Sig: Take 1 tablet (5 mg total) by mouth daily.  ?  Dispense:  30 tablet  ?  Refill:  5  ? ? ?Return precautions advised.  ?SGarret Reddish MD ? ?

## 2022-03-19 NOTE — Telephone Encounter (Signed)
See pharmacy note

## 2022-03-20 DIAGNOSIS — Z20822 Contact with and (suspected) exposure to covid-19: Secondary | ICD-10-CM | POA: Diagnosis not present

## 2022-03-22 DIAGNOSIS — Z20822 Contact with and (suspected) exposure to covid-19: Secondary | ICD-10-CM | POA: Diagnosis not present

## 2022-03-22 MED ORDER — SERTRALINE HCL 25 MG PO TABS: 25.0000 mg | ORAL_TABLET | Freq: Every day | ORAL | 5 refills | Status: DC

## 2022-03-22 NOTE — Telephone Encounter (Signed)
Keba please call pharmacy-I did send in sertraline to be taken instead of escitalopram/Lexapro- Lower interaction levelWith her flecainide ?

## 2022-03-22 NOTE — Progress Notes (Signed)
I reviewed my MyChart message with patient by phone-she is willing to try sertraline instead of escitalopram/Lexapro ?

## 2022-03-23 NOTE — Telephone Encounter (Signed)
Called and spoke with pharmacy and they are now aware of the change.  ?

## 2022-03-25 ENCOUNTER — Ambulatory Visit (INDEPENDENT_AMBULATORY_CARE_PROVIDER_SITE_OTHER): Payer: Medicare Other | Admitting: Psychology

## 2022-03-25 DIAGNOSIS — F4321 Adjustment disorder with depressed mood: Secondary | ICD-10-CM

## 2022-03-25 NOTE — Progress Notes (Signed)
? ? ? ? ? ? ? ? ? ? ? ? ? ? ? ? ? ? ? ? ? ? ? ? ? ? ? ? ? ? ?Brenda Hudson is a 71 y.o. female patient  ? ? ?Date: 03/25/2022  ?Treatment Plan: ?Diagnosis ?C78.93 (Uncomplicated bereavement) [n/a]  ?F43.21 (Adjustment Disorder, With depressed mood) [n/a]  ?F41.1 (Generalized anxiety disorder) [n/a]  ?Symptoms ?Excessive and/or unrealistic worry that is difficult to control occurring more days than not for at least 6 months about a number of events or activities. (Status: maintained) -- No Description Entered  ?Strong emotional response of sadness exhibited when losses are discussed. (Status: maintained) -- No Description Entered  ?Thoughts dominated by loss coupled with poor concentration, tearful spells, and confusion about the future. (Status: maintained) -- No Description Entered  ?Medication Status ?na  ?Safety ?none  ?If Suicidal or Homicidal State Action Taken: unspecified  ?Current Risk: low ?Medications ?unspecified ?Objectives ?Related Problem: Complete the process of letting go of the lost significant other. ?Description: Verbalize resolution of feelings of guilt and regret associated with the loss. ?Target Date: 2022-06-23 ?Frequency: Daily ?Modality: individual ?Progress: 80%  ?Related Problem: Complete the process of letting go of the lost significant other. ?Description: Participate in a therapy that addresses issues beyond grief that have arisen as a result of the loss. ?Target Date: 2022-06-23 ?Frequency: Daily ?Modality: individual ?Progress: 50% ? ?Related Problem: Learn and implement coping skills that result in a reduction of anxiety and worry, and improved daily functioning. ?Description: Learn and implement problem-solving strategies for realistically addressing worries. ?Target Date: 2022-06-23 ?Frequency: Daily ?Modality: individual ?Progress: 50% ? ?Related Problem: Learn and implement coping skills that result in a reduction of anxiety and worry, and improved daily  functioning. ?Description: Learn and implement calming skills to reduce overall anxiety and manage anxiety symptoms. ?Target Date: 2022-06-23 ?Frequency: Daily ?Modality: individual ?Progress: 40%  ?Related Problem: Learn and implement coping skills that result in a reduction of anxiety and worry, and improved daily functioning. ?Description: Describe situations, thoughts, feelings, and actions associated with anxieties and worries, their impact on functioning, and attempts to resolve them. ?Target Date: 2022-06-23 ?Frequency: Daily ?Modality: individual ?Progress: 70%  ?Client Response ?full compliance  ?Service Location ?Location, [address] [place], [place] [address]  ?Service Code ?cpt [id number]  ?Lifestyle change (exercise, nutrition)  ?Self care activities  ?Provide education, information  ?Identify/label emotions  ?Facilitate problem solving  ?Normalize/Reframe  ?Validate/empathize  ?Related past to present  ?Comments  ?Adj. Disorder with Depression ? Meds.: none  ?Goals: Brenda Hudson lost her husband suddenly several years ago when he collapsed while jogging. She has struggled to adjust to this traumatic loss, which has left a devastating void in her life. She is hoping to use counseling as a way to re-define her life and find both purpose and happiness. She relied on husband for many things and she tends to suffer from a lack of confidence. She also relied on him to co-parent their daughter, who has some special needs and presents her with multiple challenges. She is also seeking additional guidance on parenting this adult daughter. Needs to adjust to having her daughter live with her, likely for the long-term. Goal date 8-23.  ?Patient agreed to a video session due to the Coronavirus. She is at her home and I am at my home office.  ? ?Brenda Hudson says she talked to her primary care physician about medication and he prescribed Zoloft 76m. We talked about the anxiety she  has being based in "real" situations.  There is going to be a family reunion to bury her brother's ashes. Many family members will be staying at her house and she is worried about how Brenda Hudson will handle the situation. The week after the funeral it is Brenda Hudson's birthday and the weekend of Brenda Hudson's ordination. [name]'s best friend Brenda Hudson will be here and will help if Brenda Hudson has a problem going to the ordination. She worries that Brenda Hudson will want to return quickly after the ordination. We talked about strategies to manage that situation. Told her she needs to stay as long as possible and make arrangements with her friends to bring Brenda Hudson home, if needed. Brenda Hudson feels she is letting her husband Brenda Hudson down because of the way she has catered to Brenda Hudson and spent so much money. Brenda Hudson does not feel she can talk to Brenda Hudson at all about finances and Brenda Hudson has forbidden Brenda Hudson to talk about any of it. She does agree that she ultimately must confront Brenda Hudson's unrealistic perspective, but has great fears. We will discuss more after she gets through these next few weeks of family and ordination.                            ? ?Brenda Morel, PhD Time:5:10p-6:00p 50 minutes ? ? ? ? ? ? ? ? ? ? ? ? ? ? ? ? ? ? ? ? ? ?

## 2022-04-08 ENCOUNTER — Ambulatory Visit (INDEPENDENT_AMBULATORY_CARE_PROVIDER_SITE_OTHER): Payer: Medicare Other | Admitting: Psychology

## 2022-04-08 DIAGNOSIS — F4321 Adjustment disorder with depressed mood: Secondary | ICD-10-CM
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
## 2022-04-16 ENCOUNTER — Other Ambulatory Visit: Payer: Self-pay | Admitting: Family Medicine

## 2022-04-22 ENCOUNTER — Ambulatory Visit: Payer: Medicare Other | Admitting: Psychology

## 2022-04-27 DIAGNOSIS — Z6820 Body mass index (BMI) 20.0-20.9, adult: Secondary | ICD-10-CM | POA: Diagnosis not present

## 2022-04-27 DIAGNOSIS — Z1231 Encounter for screening mammogram for malignant neoplasm of breast: Secondary | ICD-10-CM | POA: Diagnosis not present

## 2022-04-27 DIAGNOSIS — Z1272 Encounter for screening for malignant neoplasm of vagina: Secondary | ICD-10-CM | POA: Diagnosis not present

## 2022-04-27 DIAGNOSIS — Z124 Encounter for screening for malignant neoplasm of cervix: Secondary | ICD-10-CM | POA: Diagnosis not present

## 2022-04-27 DIAGNOSIS — Z1151 Encounter for screening for human papillomavirus (HPV): Secondary | ICD-10-CM | POA: Diagnosis not present

## 2022-04-28 DIAGNOSIS — L570 Actinic keratosis: Secondary | ICD-10-CM | POA: Diagnosis not present

## 2022-05-06 ENCOUNTER — Ambulatory Visit (INDEPENDENT_AMBULATORY_CARE_PROVIDER_SITE_OTHER): Payer: Medicare Other | Admitting: Psychology

## 2022-05-06 DIAGNOSIS — F4321 Adjustment disorder with depressed mood: Secondary | ICD-10-CM | POA: Diagnosis not present
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
## 2022-05-14 ENCOUNTER — Ambulatory Visit: Payer: Medicare Other

## 2022-05-17 ENCOUNTER — Ambulatory Visit (INDEPENDENT_AMBULATORY_CARE_PROVIDER_SITE_OTHER): Payer: Medicare Other | Admitting: Psychology

## 2022-05-17 DIAGNOSIS — F4321 Adjustment disorder with depressed mood: Secondary | ICD-10-CM | POA: Diagnosis not present
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
## 2022-05-19 ENCOUNTER — Telehealth: Payer: Self-pay | Admitting: Family Medicine

## 2022-05-19 NOTE — Telephone Encounter (Signed)
Patient called stating her daughter received a packaged she had to sign for via [employer] for a genetic test - she also received a text message from a "sara" stating Dr Yong Channel ordered a test on her behalf.   She is unsure what this is and would like a call back as soon as possible to further discuss. Patient unsure if she should call phone number on text message.-

## 2022-05-19 NOTE — Telephone Encounter (Signed)
Called and spoke with pt and verified that we did received this form and it was faxed back.

## 2022-05-20 ENCOUNTER — Ambulatory Visit: Payer: Medicare Other | Admitting: Psychology

## 2022-05-21 ENCOUNTER — Ambulatory Visit: Payer: Medicare Other | Admitting: Family Medicine

## 2022-05-24 ENCOUNTER — Ambulatory Visit: Payer: Medicare Other

## 2022-06-03 ENCOUNTER — Ambulatory Visit (INDEPENDENT_AMBULATORY_CARE_PROVIDER_SITE_OTHER): Payer: Medicare Other | Admitting: Psychology

## 2022-06-03 DIAGNOSIS — F4321 Adjustment disorder with depressed mood: Secondary | ICD-10-CM

## 2022-06-03 NOTE — Progress Notes (Signed)
Brenda Hudson is a 71 y.o. female patient    Date: 06/03/2022  Treatment Plan: Diagnosis H74.16 (Uncomplicated bereavement) [n/a]  F43.21 (Adjustment Disorder, With depressed mood) [n/a]  F41.1 (Generalized anxiety disorder) [n/a]  Symptoms Excessive and/or unrealistic worry that is difficult to control occurring more days than not for at least 6 months about a number of events or activities. (Status: maintained) -- No Description Entered  Strong emotional response of sadness exhibited when losses are discussed. (Status: maintained) -- No Description Entered  Thoughts dominated by loss coupled with poor concentration, tearful spells, and confusion about the future. (Status: maintained) -- No Description Entered  Medication Status na  Safety none  If Suicidal or Homicidal State Action Taken: unspecified  Current Risk: low Medications unspecified Objectives Related Problem: Complete the process of letting go of the lost significant other. Description: Verbalize resolution of feelings of guilt and regret associated with the loss. Target Date: 2022-06-23 Frequency: Daily Modality: individual Progress: 80%  Related Problem: Complete the process of letting go of the lost significant other. Description: Participate in a therapy that addresses issues beyond grief that have arisen as a result of the loss. Target Date: 2022-06-23 Frequency: Daily Modality: individual Progress: 50%  Related Problem: Learn and implement coping skills that result in a reduction of anxiety and worry, and improved daily functioning. Description: Learn and implement problem-solving strategies for realistically addressing worries. Target Date: 2022-06-23 Frequency: Daily Modality: individual Progress: 50%  Related Problem: Learn and implement coping skills that result in a reduction of anxiety and worry, and improved daily  functioning. Description: Learn and implement calming skills to reduce overall anxiety and manage anxiety symptoms. Target Date: 2022-06-23 Frequency: Daily Modality: individual Progress: 40%  Related Problem: Learn and implement coping skills that result in a reduction of anxiety and worry, and improved daily functioning. Description: Describe situations, thoughts, feelings, and actions associated with anxieties and worries, their impact on functioning, and attempts to resolve them. Target Date: 2022-06-23 Frequency: Daily Modality: individual Progress: 70%  Client Response full compliance  Service Location Location, [address] [place], [place] [address]  Service Code cpt [phone]  Lifestyle change (exercise, nutrition)  Self care activities  Provide education, information  Identify/label emotions  Facilitate problem solving  Normalize/Reframe  Validate/empathize  Related past to present  Comments  Adj. Disorder with Depression  Meds.: none  Goals: Brenda Hudson lost her husband suddenly several years ago when he collapsed while jogging. She has struggled to adjust to this traumatic loss, which has left a devastating void in her life. She is hoping to use counseling as a way to re-define her life and find both purpose and happiness. She relied on husband for many things and she tends to suffer from a lack of confidence. She also relied on him to co-parent their daughter, who has some special needs and presents her with multiple challenges. She is also seeking additional guidance on parenting this adult daughter. Needs to adjust to having her daughter live with her, likely for the long-term. Goal date 8-23.  Patient agreed to a video session due to the Coronavirus. She is at her home and I am at my home office.   Brenda Hudson and Brenda Hudson talked and both agree that they could never have a family discussion with a  therapist because it would blow apart the family permanently. Brenda Hudson "has to  remain off to the side and neutral". On top of everything else, Brenda Hudson's health has been poor AND she was recently notified that a surrogate has been found. Brenda Hudson states that Brenda Hudson does not look good and is very worried about her health. This is consuming for Brenda Hudson. She says that Brenda Hudson's therapist is aware of Brenda Hudson's physical limitations, but that has not been discussed as a possible problem in moving forward with the pregnancy. I told Brenda Hudson that I have called her trauma therapist but I get a "voicemail full" message. She will send me her e-mail address so I can write her. Trying to orchestrate the best way for Brenda Hudson to have this very difficult conversation with Brenda Hudson about her significant reservations about having a baby. She says that they sold their "superstar" horse and it is a relief. She did, however, purchase another pony because "it was the only way that Brenda Hudson would agree. Brenda Hudson cannot stand up to Brenda Hudson out of fear. The prospect of her having this discussion is upsetting for [facility] and she realizes there is no choice. I have offered to be in the discussion and help guide, but Brenda Hudson says Brenda Hudson will never agree to my involvement.                                       Brenda Morel, PhD Time:5:10p-6:00p 50 minutes

## 2022-06-08 ENCOUNTER — Encounter: Payer: Self-pay | Admitting: Family Medicine

## 2022-06-08 ENCOUNTER — Ambulatory Visit (INDEPENDENT_AMBULATORY_CARE_PROVIDER_SITE_OTHER): Payer: Medicare Other | Admitting: Family Medicine

## 2022-06-08 VITALS — BP 120/78 | HR 47 | Temp 97.3°F | Ht 62.5 in | Wt 110.8 lb

## 2022-06-08 DIAGNOSIS — I4719 Other supraventricular tachycardia: Secondary | ICD-10-CM | POA: Insufficient documentation

## 2022-06-08 DIAGNOSIS — F411 Generalized anxiety disorder: Secondary | ICD-10-CM | POA: Diagnosis not present

## 2022-06-08 DIAGNOSIS — I491 Atrial premature depolarization: Secondary | ICD-10-CM | POA: Diagnosis not present

## 2022-06-08 DIAGNOSIS — I471 Supraventricular tachycardia: Secondary | ICD-10-CM | POA: Diagnosis not present

## 2022-06-08 NOTE — Patient Instructions (Addendum)
[email or web address] -an option for potential support  Lets continue same dose of medicine  Recommended follow up: Return in about 6 months (around 12/09/2022) for followup or sooner if needed.Schedule b4 you leave.
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
## 2022-06-15 ENCOUNTER — Ambulatory Visit: Payer: Medicare Other

## 2022-06-17 ENCOUNTER — Ambulatory Visit (INDEPENDENT_AMBULATORY_CARE_PROVIDER_SITE_OTHER): Payer: Medicare Other | Admitting: Psychology

## 2022-06-17 DIAGNOSIS — F4321 Adjustment disorder with depressed mood: Secondary | ICD-10-CM | POA: Diagnosis not present

## 2022-06-17 NOTE — Progress Notes (Signed)
Brenda Hudson is a 71 y.o. female patient    Date: 06/17/2022  Treatment Plan: Diagnosis W54.62 (Uncomplicated bereavement) [n/a]  F43.21 (Adjustment Disorder, With depressed mood) [n/a]  F41.1 (Generalized anxiety disorder) [n/a]  Symptoms Excessive and/or unrealistic worry that is difficult to control occurring more days than not for at least 6 months about a number of events or activities. (Status: maintained) -- No Description Entered  Strong emotional response of sadness exhibited when losses are discussed. (Status: maintained) -- No Description Entered  Thoughts dominated by loss coupled with poor concentration, tearful spells, and confusion about the future. (Status: maintained) -- No Description Entered  Medication Status na  Safety none  If Suicidal or Homicidal State Action Taken: unspecified  Current Risk: low Medications unspecified Objectives Related Problem: Complete the process of letting go of the lost significant other. Description: Verbalize resolution of feelings of guilt and regret associated with the loss. Target Date: 2022-06-23 Frequency: Daily Modality: individual Progress: 80%  Related Problem: Complete the process of letting go of the lost significant other. Description: Participate in a therapy that addresses issues beyond grief that have arisen as a result of the loss. Target Date: 2022-06-23 Frequency: Daily Modality: individual Progress: 50%  Related Problem: Learn and implement coping skills that result in a reduction of anxiety and worry, and improved daily functioning. Description: Learn and implement problem-solving strategies for realistically addressing worries. Target Date: 2022-06-23 Frequency: Daily Modality: individual Progress: 50%  Related Problem: Learn and implement coping skills that result in a reduction of anxiety and worry,  and improved daily functioning. Description: Learn and implement calming skills to reduce overall anxiety and manage anxiety symptoms. Target Date: 2022-06-23 Frequency: Daily Modality: individual Progress: 40%  Related Problem: Learn and implement coping skills that result in a reduction of anxiety and worry, and improved daily functioning. Description: Describe situations, thoughts, feelings, and actions associated with anxieties and worries, their impact on functioning, and attempts to resolve them. Target Date: 2022-06-23 Frequency: Daily Modality: individual Progress: 70%  Client Response full compliance  Service Location Location, [address] [place], [place] [address]  Service Code cpt [phone]  Lifestyle change (exercise, nutrition)  Self care activities  Provide education, information  Identify/label emotions  Facilitate problem solving  Normalize/Reframe  Validate/empathize  Related past to present  Comments  Adj. Disorder with Depression  Meds.: none  Goals: Brenda Hudson lost her husband suddenly several years ago when he collapsed while jogging. Brenda Hudson has struggled to adjust to this traumatic loss, which has left a devastating void in her life. Brenda Hudson is hoping to use counseling as a way to re-define her life and find both purpose and happiness. Brenda Hudson relied on husband for many things and Brenda Hudson tends to suffer from a lack of confidence. Brenda Hudson also relied on him to co-parent their daughter, who has some special needs and presents her with multiple challenges. Brenda Hudson is also seeking additional guidance on parenting this adult daughter. Needs to adjust to having her daughter live with her, likely for the long-term. Goal date 12-23.  Patient agreed to a video session due to the Coronavirus. Brenda Hudson is at her home and I am at my home office.   Brenda Hudson  that Brenda Hudson went to concert with both girls and Will. It all went well and everyone got along. Brenda Hudson met with her trauma therapist yesterday and  said that Brenda Hudson echoed everything I have been telling her about self care/preservation and setting limits with Brenda Hudson. The therapist Brenda Hudson) does say Brenda Hudson is willing to be the therapist to meet with Brenda Hudson and Brenda Hudson together. 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 That was all very positive. Then Brenda Hudson got enraged with her for sharing that her financial advisor said they could not afford to stay in their current house. Brenda Hudson let Brenda Hudson rage at her for almost 2 hours. Brenda Hudson also told Brenda Hudson that Brenda Hudson needs to think about getting a job because Brenda Hudson will not be able to support the child. Brenda Hudson Hudson that "for now on I just need to leave". After Brenda Hudson rages at her Brenda Hudson apologizes and asks her if Brenda Hudson is loved the most. I coached her in how to respond to the rage and insults that Brenda Hudson throws at her. Brenda Hudson is convinced that Brenda Hudson has a personality disorder. Brenda Hudson has questioned that as well, but does not know what to do. Brenda Hudson is to come stay with Brenda Hudson for 3 days and Brenda Hudson is committed to make sure it happens and that Brenda Hudson will not let Brenda Hudson interfere.                                           Brenda Morel, PhD Time:5:10p-6:00p 50 minutes

## 2022-06-27 DIAGNOSIS — J019 Acute sinusitis, unspecified: Secondary | ICD-10-CM | POA: Diagnosis not present

## 2022-07-01 ENCOUNTER — Ambulatory Visit (INDEPENDENT_AMBULATORY_CARE_PROVIDER_SITE_OTHER): Payer: Medicare Other | Admitting: Psychology

## 2022-07-01 DIAGNOSIS — F4321 Adjustment disorder with depressed mood: Secondary | ICD-10-CM

## 2022-07-01 NOTE — Progress Notes (Addendum)
DANE BLOCH is a 71 y.o. female patient    Date: 07/01/2022  Treatment Plan: Diagnosis Y17.49 (Uncomplicated bereavement) [n/a]  F43.21 (Adjustment Disorder, With depressed mood) [n/a]  F41.1 (Generalized anxiety disorder) [n/a]  Symptoms Excessive and/or unrealistic worry that is difficult to control occurring more days than not for at least 6 months about a number of events or activities. (Status: maintained) -- No Description Entered  Strong emotional response of sadness exhibited when losses are discussed. (Status: maintained) -- No Description Entered  Thoughts dominated by loss coupled with poor concentration, tearful spells, and confusion about the future. (Status: maintained) -- No Description Entered  Medication Status na  Safety none  If Suicidal or Homicidal State Action Taken: unspecified  Current Risk: low Medications unspecified Objectives Related Problem: Complete the process of letting go of the lost significant other. Description: Verbalize resolution of feelings of guilt and regret associated with the loss. Target Date: 2022-10-23 Frequency: Daily Modality: individual Progress: 80%  Related Problem: Complete the process of letting go of the lost significant other. Description: Participate in a therapy that addresses issues beyond grief that have arisen as a result of the loss. Target Date: 2022-10-23 Frequency: Daily Modality: individual Progress: 70%  Related Problem: Learn and implement coping skills that result in a reduction of anxiety and worry, and improved daily functioning. Description: Learn and implement problem-solving strategies for realistically addressing worries. Target Date: 2022-10-23 Frequency: Daily Modality: individual Progress: 60%  Related Problem: Learn and implement coping skills that result in a reduction of anxiety and worry, and improved daily functioning. Description: Learn and implement calming skills to  reduce overall anxiety and manage anxiety symptoms. Target Date: 2022-10-23 Frequency: Daily Modality: individual Progress: 50%  Related Problem: Learn and implement coping skills that result in a reduction of anxiety and worry, and improved daily functioning. Description: Describe situations, thoughts, feelings, and actions associated with anxieties and worries, their impact on functioning, and attempts to resolve them. Target Date: 2022-10-23 Frequency: Daily Modality: individual Progress: 70%  Client Response full compliance  Service Location Location, [address] [place], [place] [address]  Service Code cpt [phone]  Lifestyle change (exercise, nutrition)  Self care activities  Provide education, information  Identify/label emotions  Facilitate problem solving  Normalize/Reframe  Validate/empathize  Related past to present  Comments  Adj. Disorder with Depression  Meds.: none  Goals: Lashone lost her husband suddenly several years ago when he collapsed while jogging. She has struggled to adjust to this traumatic loss, which has left a devastating void in her life. She is hoping to use counseling as a way to re-define her life and find both purpose and happiness. She relied on husband for many things and she tends to suffer from a lack of confidence. She also relied on him to co-parent their daughter, who has some special needs and presents her with multiple challenges. She is also seeking additional guidance on parenting this adult daughter. Needs to adjust to having her daughter live with her, likely for the long-term. Focus on treatment evolving to address the complicated and conflictual relationship with daughter Kit. Alvie expresses many concerns/fears about this relationship, and is paralyzed by these fears and guilt in her efforts to effectively parent and set appropriate boundaries.  Goal date 12-23.  Patient agreed to a video session. She is at her home and I am at  my home office.   Araiyah is fighting off an infection, but  is under the care of her doctor. Kit has also had some GI problems. Her grandson came to stay for 3 days and she says "we all had a great time". She says Lissa Hoard is very smart, but also very sensitive and "too hard on himself". The family is very concerned about him because he will say things about feeling bad about himself. He is seeing a therapist and psychiatrist. Lovie Chol him but is worried about his emotional health. [name] started her new job at [employer] in [place]. [name] is planning to go to hear her first sermon on the 27th. Kit says there is another potential surrogate that [name] says is very Chiropractor. [name] says the way she feels she needs to approach the topic with Kit is health issues. We talked about the approach to take with Kit about the problems with moving forward with having a baby. This discussion is overwhelming for [name] to have with Kit. Juliann Pulse started attending a trauma support group on zoom last night. She found that many of them have similar experiences as she is having with Kit. We will talk next week to talk more specifically about the discussion with Kit.                                              [name], PhD Time:5:15p-6:00p 45 minutes

## 2022-07-15 ENCOUNTER — Ambulatory Visit: Payer: Medicare Other | Admitting: Family Medicine

## 2022-07-15 ENCOUNTER — Ambulatory Visit: Payer: Medicare Other | Admitting: Psychology

## 2022-07-20 ENCOUNTER — Ambulatory Visit: Payer: Medicare Other | Admitting: Psychology

## 2022-07-29 ENCOUNTER — Ambulatory Visit: Payer: Medicare Other | Admitting: Family Medicine

## 2022-07-29 ENCOUNTER — Ambulatory Visit (INDEPENDENT_AMBULATORY_CARE_PROVIDER_SITE_OTHER): Payer: Medicare Other | Admitting: Psychology

## 2022-07-29 DIAGNOSIS — F4321 Adjustment disorder with depressed mood: Secondary | ICD-10-CM | POA: Diagnosis not present

## 2022-07-29 NOTE — Progress Notes (Signed)
Brenda Hudson is a 72 y.o. female patient    Date: 07/29/2022  Treatment Plan: Diagnosis Q46.96 (Uncomplicated bereavement) [n/a]  F43.21 (Adjustment Disorder, With depressed mood) [n/a]  F41.1 (Generalized anxiety disorder) [n/a]  Symptoms Excessive and/or unrealistic worry that is difficult to control occurring more days than not for at least 6 months about a number of events or activities. (Status: maintained) -- No Description Entered  Strong emotional response of sadness exhibited when losses are discussed. (Status: maintained) -- No Description Entered  Thoughts dominated by loss coupled with poor concentration, tearful spells, and confusion about the future. (Status: maintained) -- No Description Entered  Medication Status na  Safety none  If Suicidal or Homicidal State Action Taken: unspecified  Current Risk: low Medications unspecified Objectives Related Problem: Complete the process of letting go of the lost significant other. Description: Verbalize resolution of feelings of guilt and regret associated with the loss. Target Date: 2022-10-23 Frequency: Daily Modality: individual Progress: 80%  Related Problem: Complete the process of letting go of the lost significant other. Description: Participate in a therapy that addresses issues beyond grief that have arisen as a result of the loss. Target Date: 2022-10-23 Frequency: Daily Modality: individual Progress: 70%  Related Problem: Learn and implement coping skills that result in a reduction of anxiety and worry, and improved daily functioning. Description: Learn and implement problem-solving strategies for realistically addressing worries. Target Date: 2022-10-23 Frequency: Daily Modality: individual Progress: 60%  Related Problem: Learn and implement coping skills that result in a reduction of anxiety and worry, and improved daily functioning. Description: Learn and implement calming skills to reduce  overall anxiety and manage anxiety symptoms. Target Date: 2022-10-23 Frequency: Daily Modality: individual Progress: 50%  Related Problem: Learn and implement coping skills that result in a reduction of anxiety and worry, and improved daily functioning. Description: Describe situations, thoughts, feelings, and actions associated with anxieties and worries, their impact on functioning, and attempts to resolve them. Target Date: 2022-10-23 Frequency: Daily Modality: individual Progress: 70%  Client Response full compliance  Service Location Location, [address] [place], [place] [address]  Service Code cpt [phone]  Lifestyle change (exercise, nutrition)  Self care activities  Provide education, information  Identify/label emotions  Facilitate problem solving  Normalize/Reframe  Validate/empathize  Related past to present  Comments  Adj. Disorder with Depression  Meds.: none  Goals: Brenda Hudson lost her husband suddenly several years ago when he collapsed while jogging. She has struggled to adjust to this traumatic loss, which has left a devastating void in her life. She is hoping to use counseling as a way to re-define her life and find both purpose and happiness. She relied on husband for many things and she tends to suffer from a lack of confidence. She also relied on him to co-parent their daughter, who has some special needs and presents her with multiple challenges. She is also seeking additional guidance on parenting this adult daughter. Needs to adjust to having her daughter live with her, likely for the long-term. Focus on treatment evolving to address the complicated and conflictual relationship with daughter Brenda Hudson. Brenda Hudson expresses many concerns/fears about this relationship, and is paralyzed by these fears and guilt in her efforts to effectively parent and set appropriate boundaries.  Goal date 12-23.  Patient agreed to a video session. She is at her home and I am at my home  office.   Janelys says "for the past week we have been doing really well". She has  been to [facility] to see the grandchildren. There was a Conservation officer, nature and Brenda Hudson is not aware of the trigger. She believes there is NO WAY she can block Brenda Hudson's decision to have a child. Says Brenda Hudson has done some positive things like getting some part time work. She says even Brenda Hudson says "it would be cruel" to tell Brenda Hudson no at this point. Brenda Hudson is telling her to write a letter of apology and wants her to say things that Brenda Hudson does not believe are true. Wants Brenda Hudson to say she knew and did things that Brenda Hudson says she did not do. She also tells Brenda Hudson she wants to "integrate the past" into their daily conversation.  Brenda Hudson is very concerned about Brenda Hudson's therapy and whether she is getting the help she needs. Her therapist is seeing her 6-7 hours/week. The therapist comes to the house and insurance only pays a fraction. Brenda Hudson is afraid to address her concerns with Brenda Hudson. She tell Brenda Hudson she should be in jail for abuse and neglect and that she can call social services and have her put in jail. Brenda Hudson is moving forward with securing a surrogate. The fear that Brenda Hudson is experiencing is paralyzing and she is confident it would be the end of the relationship is she doesn't "go along" with the plan.                                                   [name], PhD Time:5:15p-6:00p 45 minutes

## 2022-08-02 DIAGNOSIS — Z23 Encounter for immunization: Secondary | ICD-10-CM | POA: Diagnosis not present

## 2022-08-09 ENCOUNTER — Encounter: Payer: Self-pay | Admitting: *Deleted

## 2022-08-11 ENCOUNTER — Other Ambulatory Visit: Payer: Self-pay | Admitting: Physician Assistant

## 2022-08-11 DIAGNOSIS — I341 Nonrheumatic mitral (valve) prolapse: Secondary | ICD-10-CM

## 2022-08-11 DIAGNOSIS — R002 Palpitations: Secondary | ICD-10-CM

## 2022-08-11 DIAGNOSIS — M25562 Pain in left knee: Secondary | ICD-10-CM | POA: Diagnosis not present

## 2022-08-12 ENCOUNTER — Ambulatory Visit (INDEPENDENT_AMBULATORY_CARE_PROVIDER_SITE_OTHER): Payer: Medicare Other | Admitting: Psychology

## 2022-08-12 DIAGNOSIS — F4321 Adjustment disorder with depressed mood: Secondary | ICD-10-CM

## 2022-08-12 NOTE — Progress Notes (Signed)
Brenda Hudson is a 71 y.o. female patient    Date: 08/12/2022  Treatment Plan: Diagnosis U72.53 (Uncomplicated bereavement) [n/a]  F43.21 (Adjustment Disorder, With depressed mood) [n/a]  F41.1 (Generalized anxiety disorder) [n/a]  Symptoms Excessive and/or unrealistic worry that is difficult to control occurring more days than not for at least 6 months about a number of events or activities. (Status: maintained) -- No Description Entered  Strong emotional response of sadness exhibited when losses are discussed. (Status: maintained) -- No Description Entered  Thoughts dominated by loss coupled with poor concentration, tearful spells, and confusion about the future. (Status: maintained) -- No Description Entered  Medication Status na  Safety none  If Suicidal or Homicidal State Action Taken: unspecified  Current Risk: low Medications unspecified Objectives Related Problem: Complete the process of letting go of the lost significant other. Description: Verbalize resolution of feelings of guilt and regret associated with the loss. Target Date: 2022-10-23 Frequency: Daily Modality: individual Progress: 80%  Related Problem: Complete the process of letting go of the lost significant other. Description: Participate in a therapy that addresses issues beyond grief that have arisen as a result of the loss. Target Date: 2022-10-23 Frequency: Daily Modality: individual Progress: 70%  Related Problem: Learn and implement coping skills that result in a reduction of anxiety and worry, and improved daily functioning. Description: Learn and implement problem-solving strategies for realistically addressing worries. Target Date: 2022-10-23 Frequency: Daily Modality: individual Progress: 60%  Related Problem: Learn and implement coping skills that result in a reduction of anxiety and worry, and improved daily functioning. Description: Learn and implement  calming skills to reduce overall anxiety and manage anxiety symptoms. Target Date: 2022-10-23 Frequency: Daily Modality: individual Progress: 50%  Related Problem: Learn and implement coping skills that result in a reduction of anxiety and worry, and improved daily functioning. Description: Describe situations, thoughts, feelings, and actions associated with anxieties and worries, their impact on functioning, and attempts to resolve them. Target Date: 2022-10-23 Frequency: Daily Modality: individual Progress: 70%  Client Response full compliance  Service Location Location, [address] [place], [place] [address]  Service Code cpt [phone]  Lifestyle change (exercise, nutrition)  Self care activities  Provide education, information  Identify/label emotions  Facilitate problem solving  Normalize/Reframe  Validate/empathize  Related past to present  Comments  Adj. Disorder with Depression  Meds.: none  Goals: Brenda Hudson lost her husband suddenly several years ago when he collapsed while jogging. She has struggled to adjust to this traumatic loss, which has left a devastating void in her life. She is hoping to use counseling as a way to re-define her life and find both purpose and happiness. She relied on husband for many things and she tends to suffer from a lack of confidence. She also relied on him to co-parent their daughter, who has some special needs and presents her with multiple challenges. She is also seeking additional guidance on parenting this adult daughter. Needs to adjust to having her daughter live with her, likely for the long-term. Focus on treatment evolving to address the complicated and conflictual relationship with daughter Brenda Hudson. Brenda Hudson expresses many concerns/fears about this relationship, and is paralyzed by these fears and guilt in her efforts to effectively parent and set appropriate boundaries.  Goal date 12-23.  Patient agreed to a video session. She is at her  home and I am at my home office.  Brenda Hudson says that the process of getting the surrogate is moving forward. She feels Brenda Hudson is making an effort to "tolerate" her and get along better. We talked about Brenda Hudson's distress about the amount of therapy that Brenda Hudson receives weekly. Brenda Hudson feels "taken advantage of" because Brenda Hudson is seeing her therapist 3-5 times/week for 2 hours per session. We talked about ways to address this issue with Brenda Hudson without creating significant conflict.  She talked about her feels about this being the 10th year since Brenda Hudson's death. She is "very sad" when she thinks about all that he has missed. That, coupled with her financial worries, makes her feel overwhelmed. Now, she is having significant orthopedic issues (tear in meniscus and arthritis). She states that there are days she feels like saying to Brenda Hudson "I cannot do this anymore". She says "I love her, but she is the most difficult person I know".                                                      Brenda Morel, PhD Time:5:15p-6:00p 45 minutes

## 2022-08-13 ENCOUNTER — Telehealth: Payer: Self-pay | Admitting: Family Medicine

## 2022-08-13 NOTE — Telephone Encounter (Signed)
Copied from [facility]. Topic: Medicare AWV >> Aug 13, 2022 10:15 AM Devoria Glassing wrote: Reason for CRM: Left message for patient to schedule Annual Wellness Visit.  Please schedule with Nurse Health Advisor Charlott Rakes, RN at [facility]. This appt can be telephone or office visit. Please call [phone] ask for [facility]

## 2022-08-14 ENCOUNTER — Other Ambulatory Visit: Payer: Self-pay | Admitting: Internal Medicine

## 2022-08-14 DIAGNOSIS — I493 Ventricular premature depolarization: Secondary | ICD-10-CM

## 2022-08-26 ENCOUNTER — Ambulatory Visit: Payer: Medicare Other | Admitting: Psychology

## 2022-09-03 DIAGNOSIS — M25562 Pain in left knee: Secondary | ICD-10-CM | POA: Diagnosis not present

## 2022-09-06 ENCOUNTER — Ambulatory Visit: Payer: Medicare Other | Admitting: Psychology

## 2022-09-06 DIAGNOSIS — S83232D Complex tear of medial meniscus, current injury, left knee, subsequent encounter: Secondary | ICD-10-CM | POA: Diagnosis not present

## 2022-09-09 ENCOUNTER — Telehealth: Payer: Self-pay | Admitting: Internal Medicine

## 2022-09-09 ENCOUNTER — Ambulatory Visit: Payer: Medicare Other | Admitting: Psychology

## 2022-09-09 ENCOUNTER — Ambulatory Visit (INDEPENDENT_AMBULATORY_CARE_PROVIDER_SITE_OTHER): Payer: Medicare Other

## 2022-09-09 ENCOUNTER — Other Ambulatory Visit: Payer: Self-pay | Admitting: Internal Medicine

## 2022-09-09 VITALS — BP 92/53 | HR 64 | Temp 98.0°F | Wt 113.2 lb

## 2022-09-09 DIAGNOSIS — I493 Ventricular premature depolarization: Secondary | ICD-10-CM

## 2022-09-09 DIAGNOSIS — Z0000 Encounter for general adult medical examination without abnormal findings: Secondary | ICD-10-CM

## 2022-09-09 MED ORDER — FLECAINIDE ACETATE 100 MG PO TABS: 100.0000 mg | ORAL_TABLET | Freq: Two times a day (BID) | ORAL | 0 refills | Status: DC

## 2022-09-09 NOTE — Telephone Encounter (Signed)
*  STAT* If patient is at the pharmacy, call can be transferred to refill team.   1. Which medications need to be refilled? (please list name of each medication and dose if known) flecainide (TAMBOCOR) 100 MG tablet  2. Which pharmacy/location (including street and city if local pharmacy) is medication to be sent to?   [id number] [place] [place] [address]   3. Do they need a 30 day or 90 day supply? 90  Pt made an appt for 11/19/21

## 2022-09-09 NOTE — Progress Notes (Signed)
Subjective:   Brenda Hudson is a 71 y.o. female who presents for Medicare Annual (Subsequent) preventive examination.  Review of Systems     Cardiac Risk Factors include: advanced age (>62mn, >>58women)     Objective:    Today's Vitals   09/09/22 1425  BP: (!) 92/53  Pulse: 64  Temp: 98 F (36.7 C)  SpO2: 100%  Weight: 113 lb 3.2 oz (51.3 kg)   Body mass index is 20.37 kg/m.     09/09/2022    2:36 PM 05/11/2021    9:37 AM 12/21/2019    3:07 PM 08/08/2014    1:40 PM  Advanced Directives  Does Patient Have a Medical Advance Directive? Yes Yes Yes Yes  Type of AParamedicof AAlstonLiving will Living will Living will;Healthcare Power of APierre Part Does patient want to make changes to medical advance directive?   No - Patient declined   Copy of HJagualin Chart? No - copy requested  No - copy requested     Current Medications (verified) Outpatient Encounter Medications as of 09/09/2022  Medication Sig   ACETAMINOPHEN PO Take by mouth as needed.   Cholecalciferol (VITAMIN D3) 50 MCG (2000 UT) CAPS Take by mouth.   flecainide (TAMBOCOR) 100 MG tablet TAKE 1 TABLET BY MOUTH TWICE A DAY   Ibuprofen 200 MG CAPS Take 400 mg by mouth in the morning and at bedtime.    loratadine (CLARITIN) 10 MG tablet Take 10 mg by mouth daily as needed for allergies.   MELATONIN PO Take by mouth.   metoprolol succinate (TOPROL-XL) 25 MG 24 hr tablet TAKE 1 TABLET (25 MG TOTAL) BY MOUTH DAILY.   sertraline (ZOLOFT) 25 MG tablet TAKE 1 TABLET (25 MG TOTAL) BY MOUTH DAILY.   No facility-administered encounter medications on file as of 09/09/2022.    Allergies (verified) Cefaclor, Penicillins, and Sulfonamide derivatives   History: Past Medical History:  Diagnosis Date   Allergy    Anxiety    Arthritis    Diverticulosis    Frequent PVCs    and PAC per pt/has had along time   GERD (gastroesophageal reflux  disease)    Heart murmur    Hx of echocardiogram    a. Echo 12/13:  EF 55-60%, mild MR, normal diast fxn, no MVP   IBS (irritable bowel syndrome)    MVP (mitral valve prolapse)    Osteoporosis    Palpitations    Polymyalgia rheumatica (HCC)    Rectal pain    Spinal stenosis    Past Surgical History:  Procedure Laterality Date   COLONOSCOPY     DILATION AND CURETTAGE OF UTERUS     TONSILLECTOMY     TUBAL LIGATION     Family History  Problem Relation Age of Onset   Sleep apnea Mother    Arthritis Mother    Diabetes Father        in 546s  Kidney disease Father        DM related, renal failure   Heart disease Father        CVA young age, Smoker, overweight   Colon polyps Father    Esophageal cancer Neg Hx    Rectal cancer Neg Hx    Stomach cancer Neg Hx    Colon cancer Neg Hx    Social History   Socioeconomic History   Marital status: Widowed    Spouse name: Not on  file   Number of children: 2   Years of education: Not on file   Highest education level: Not on file  Occupational History   Occupation: Teacher    Comment: Special Education   Tobacco Use   Smoking status: Never   Smokeless tobacco: Never  Vaping Use   Vaping Use: Never used  Substance and Sexual Activity   Alcohol use: No   Drug use: No   Sexual activity: Not on file  Other Topics Concern   Not on file  Social History Narrative   Widowed in Feb 24, 2013 (husband sudden death MI despite being in great shape). 2 daughters. Oldest daughter pregnant in 09/2014-Phd student at [employer] in early [place]. Younger daughter [name] grad student [employer] in [place].       [employer]. Both she and her husband. Husband went to [employer] for lawschool. 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, regular walking, reading, dogs (2) 6 horses    Social Determinants of  Health   Financial Resource Strain: Low Risk  (09/09/2022)   Overall Financial Resource Strain (CARDIA)    Difficulty of Paying Living Expenses: Not hard at all  Food Insecurity: No Food Insecurity (09/09/2022)   Hunger Vital Sign    Worried About Running Out of Food in the Last Year: Never true    Ran Out of Food in the Last Year: Never true  Transportation Needs: No Transportation Needs (09/09/2022)   PRAPARE - Hydrologist (Medical): No    Lack of Transportation (Non-Medical): No  Physical Activity: Sufficiently Active (09/09/2022)   Exercise Vital Sign    Days of Exercise per Week: 7 days    Minutes of Exercise per Session: 40 min  Stress: Stress Concern Present (09/09/2022)   [place]    Feeling of Stress : To some extent  Social Connections: Moderately Isolated (09/09/2022)   Social Connection and Isolation Panel [NHANES]    Frequency of Communication with Friends and Family: More than three times a week    Frequency of Social Gatherings with Friends and Family: More than three times a week    Attends Religious Services: More than 4 times per year    Active Member of [employer] or Organizations: No    Attends Archivist Meetings: Never    Marital Status: Widowed    Tobacco Counseling Counseling given: Not Answered   Clinical Intake:  Pre-visit preparation completed: Yes  Pain : No/denies pain     BMI - recorded: 20.37 Nutritional Status: BMI of 19-24  Normal Nutritional Risks: None Diabetes: No  How often do you need to have someone help you when you read instructions, pamphlets, or other written materials from your doctor or pharmacy?: 1 - Never  Diabetic?no  Interpreter Needed?: No  Information entered by :: Charlott Rakes, LPN   Activities of Daily Living    09/09/2022    2:37 PM  In your present state of health, do you have any difficulty  performing the following activities:  Hearing? 0  Vision? 0  Difficulty concentrating or making decisions? 0  Walking or climbing stairs? 0  Dressing or bathing? 0  Doing errands, shopping? 0  Preparing Food and eating ? N  Using the Toilet? N  In the past six months,  have you accidently leaked urine? Y  Comment at times  Do you have problems with loss of bowel control? N  Managing your Medications? N  Managing your Finances? N  Housekeeping or managing your Housekeeping? N    Patient Care Team: [name], MD as PCP - General (Family Medicine) Dorothy Spark, MD as PCP - Cardiology (Cardiology) Evans Lance, MD as PCP - Electrophysiology (Cardiology) Jovita Gamma, MD as Consulting Physician (Neurosurgery) Jolene Provost, PA-C as Physician Assistant (Otolaryngology) Dian Queen, MD as Consulting Physician (Obstetrics and Gynecology) Mauri Pole, MD as Consulting Physician (Gastroenterology)  Indicate any recent Medical Services you may have received from other than [facility] providers in the past year (date may be approximate).     Assessment:   This is a routine wellness examination for Lekia.  Hearing/Vision screen Hearing Screening - Comments:: Pt denies any hearing issues  Vision Screening - Comments:: Encouraged pt to follow up with [facility]   Dietary issues and exercise activities discussed: Current Exercise Habits: Home exercise routine, Type of exercise: walking, Time (Minutes): 45, Frequency (Times/Week): 7, Weekly Exercise (Minutes/Week): 315   Goals Addressed             This Visit's Progress    Patient Stated       Stay active and maintain health        Depression Screen    09/09/2022    2:34 PM 06/08/2022    4:04 PM 03/19/2022    2:55 PM 05/11/2021    9:36 AM 12/21/2019    3:09 PM 08/21/2019    4:04 PM 03/23/2019    7:55 AM  PHQ 2/9 Scores  PHQ - 2 Score 0 0 1 0 0 0 0  PHQ- 9 Score  2 6        Fall  Risk    09/09/2022    2:37 PM 06/08/2022    4:05 PM 05/11/2021    9:38 AM 12/21/2019    3:09 PM 05/04/2019    3:09 PM  Fall Risk   Falls in the past year? 0 0 0 0 0  Number falls in past yr: 0 0 0 0   Injury with Fall? 0 0 0 0   Risk for fall due to : Impaired vision No Fall Risks Impaired vision    Follow up Falls prevention discussed Falls evaluation completed Falls prevention discussed Education provided;Falls prevention discussed;Falls evaluation completed     FALL RISK PREVENTION PERTAINING TO THE HOME:  Any stairs in or around the home? Yes  If so, are there any without handrails? No  Home free of loose throw rugs in walkways, pet beds, electrical cords, etc? No  Adequate lighting in your home to reduce risk of falls? No   ASSISTIVE DEVICES UTILIZED TO PREVENT FALLS:  Life alert? Yes  Use of a cane, walker or w/c? No  Grab bars in the bathroom? Yes  Shower chair or bench in shower? No  Elevated toilet seat or a handicapped toilet? No   TIMED UP AND GO:  Was the test performed? Yes .  Length of time to ambulate 10 feet: 10 sec.   Gait steady and fast without use of assistive device  Cognitive Function:        09/09/2022    2:38 PM 05/11/2021    9:40 AM 12/21/2019    3:08 PM  6CIT Screen  What Year? 0 points 0 points 0 points  What month? 0 points 0  points 0 points  What time? 0 points 0 points 0 points  Count back from 20 0 points 0 points 0 points  Months in reverse 0 points 0 points 0 points  Repeat phrase 0 points 0 points 0 points  Total Score 0 points 0 points 0 points    Immunizations Immunization History  Administered Date(s) Administered   Fluad Quad(high Dose 65+) 08/21/2019, 10/24/2020   Influenza Inj Mdck Quad With Preservative 11/15/2018   Influenza Split 11/25/2011   Influenza, High Dose Seasonal PF 07/26/2021   Influenza,inj,Quad PF,6+ Mos 08/28/2013, 09/10/2014, 08/05/2016, 09/22/2017   Influenza-Unspecified 08/02/2022   PFIZER  Comirnaty(Gray Top)Covid-19 Tri-Sucrose Vaccine 08/13/2020, 03/27/2021   PFIZER(Purple Top)SARS-COV-2 Vaccination 12/23/2019, 01/17/2020   Pfizer Covid-19 Vaccine Bivalent Booster 62yr & up 07/26/2021, 08/02/2022   Pneumococcal Conjugate-13 08/21/2019   Td 03/21/2008   Tdap 03/21/2008, 04/25/2015   Unspecified SARS-COV-2 Vaccination 07/26/2021   Zoster, Live 09/07/2013    TDAP status: Up to date  Flu Vaccine status: Up to date  Pneumococcal vaccine status: Due, Education has been provided regarding the importance of this vaccine. Advised may receive this vaccine at local pharmacy or Health Dept. Aware to provide a copy of the vaccination record if obtained from local pharmacy or Health Dept. Verbalized acceptance and understanding.  Covid-19 vaccine status: Completed vaccines  Qualifies for Shingles Vaccine? Yes   Zostavax completed No   Shingrix Completed?: No.    Education has been provided regarding the importance of this vaccine. Patient has been advised to call insurance company to determine out of pocket expense if they have not yet received this vaccine. Advised may also receive vaccine at local pharmacy or Health Dept. Verbalized acceptance and understanding.  Screening Tests Health Maintenance  Topic Date Due   Zoster Vaccines- Shingrix (1 of 2) Never done   Pneumonia Vaccine 69 Years old (2 - PPSV23 or PCV20) 06/09/2023 (Originally 08/20/2020)   COVID-19 Vaccine (7 - Pfizer risk series) 09/27/2022   MAMMOGRAM  03/16/2023   Medicare Annual Wellness (AWV)  10/10/2023   TETANUS/TDAP  04/24/2025   COLONOSCOPY (Pts 45-412yrInsurance coverage will need to be confirmed)  11/24/2028   INFLUENZA VACCINE  Completed   DEXA SCAN  Completed   Hepatitis C Screening  Completed   HPV VACCINES  Aged Out    Health Maintenance  Health Maintenance Due  Topic Date Due   Zoster Vaccines- Shingrix (1 of 2) Never done    Colorectal cancer screening: Type of screening: Colonoscopy.  Completed 11/24/18. Repeat every 10 years  Mammogram status: Completed 03/15/21. Repeat every year  Bone Density status: Completed 08/09/18. Results reflect: Bone density results: OSTEOPENIA. Repeat every 2 years.   Additional Screening:  Hepatitis C Screening:  Completed 07/07/18  Vision Screening: Recommended annual ophthalmology exams for early detection of glaucoma and other disorders of the eye. Is the patient up to date with their annual eye exam?  No  Who is the provider or what is the name of the office in which the patient attends annual eye exams? [facility]  If pt is not established with a provider, would they like to be referred to a provider to establish care? No .   Dental Screening: Recommended annual dental exams for proper oral hygiene  Community Resource Referral / Chronic Care Management: CRR required this visit?  No   CCM required this visit?  No      Plan:     I have personally reviewed and noted the following in  the patient's chart:   Medical and social history Use of alcohol, tobacco or illicit drugs  Current medications and supplements including opioid prescriptions. Patient is not currently taking opioid prescriptions. Functional ability and status Nutritional status Physical activity Advanced directives List of other physicians Hospitalizations, surgeries, and ER visits in previous 12 months Vitals Screenings to include cognitive, depression, and falls Referrals and appointments  In addition, I have reviewed and discussed with patient certain preventive protocols, quality metrics, and best practice recommendations. A written personalized care plan for preventive services as well as general preventive health recommendations were provided to patient.     Willette Brace, LPN   58/25/1898   Nurse Notes: none

## 2022-09-09 NOTE — Telephone Encounter (Signed)
Pt's medication was sent to pt's pharmacy as requested. Confirmation received.  °

## 2022-09-09 NOTE — Patient Instructions (Signed)
Brenda Hudson , Thank you for taking time to come for your Medicare Wellness Visit. I appreciate your ongoing commitment to your health goals. Please review the following plan we discussed and let me know if I can assist you in the future.   These are the goals we discussed:  Goals      Patient Stated     Stay healthy        This is a list of the screening recommended for you and due dates:  Health Maintenance  Topic Date Due   Zoster (Shingles) Vaccine (1 of 2) Never done   COVID-19 Vaccine (6 - Pfizer risk series) 09/20/2021   Flu Shot  06/15/2022   Pneumonia Vaccine (2 - PPSV23 or PCV20) 06/09/2023*   Mammogram  03/16/2023   Medicare Annual Wellness Visit  10/10/2023   Tetanus Vaccine  04/24/2025   Colon Cancer Screening  11/24/2028   DEXA scan (bone density measurement)  Completed   Hepatitis C Screening: USPSTF Recommendation to screen - Ages 73-79 yo.  Completed   HPV Vaccine  Aged Out  *Topic was postponed. The date shown is not the original due date.    Advanced directives: Please bring a copy of your health care power of attorney and living will to the office at your convenience.  Conditions/risks identified: stay active and maintain health   Next appointment: Follow up in one year for your annual wellness visit    Preventive Care 65 Years and Older, Female Preventive care refers to lifestyle choices and visits with your health care provider that can promote health and wellness. What does preventive care include? A yearly physical exam. This is also called an annual well check. Dental exams once or twice a year. Routine eye exams. Ask your health care provider how often you should have your eyes checked. Personal lifestyle choices, including: Daily care of your teeth and gums. Regular physical activity. Eating a healthy diet. Avoiding tobacco and drug use. Limiting alcohol use. Practicing safe sex. Taking low-dose aspirin every day. Taking vitamin and mineral  supplements as recommended by your health care provider. What happens during an annual well check? The services and screenings done by your health care provider during your annual well check will depend on your age, overall health, lifestyle risk factors, and family history of disease. Counseling  Your health care provider may ask you questions about your: Alcohol use. Tobacco use. Drug use. Emotional well-being. Home and relationship well-being. Sexual activity. Eating habits. History of falls. Memory and ability to understand (cognition). Work and work Statistician. Reproductive health. Screening  You may have the following tests or measurements: Height, weight, and BMI. Blood pressure. Lipid and cholesterol levels. These may be checked every 5 years, or more frequently if you are over 35 years old. Skin check. Lung cancer screening. You may have this screening every year starting at age 42 if you have a 30-pack-year history of smoking and currently smoke or have quit within the past 15 years. Fecal occult blood test (FOBT) of the stool. You may have this test every year starting at age 58. Flexible sigmoidoscopy or colonoscopy. You may have a sigmoidoscopy every 5 years or a colonoscopy every 10 years starting at age 64. Hepatitis C blood test. Hepatitis B blood test. Sexually transmitted disease (STD) testing. Diabetes screening. This is done by checking your blood sugar (glucose) after you have not eaten for a while (fasting). You may have this done every 1-3 years. Bone density scan. This  is done to screen for osteoporosis. You may have this done starting at age 92. Mammogram. This may be done every 1-2 years. Talk to your health care provider about how often you should have regular mammograms. Talk with your health care provider about your test results, treatment options, and if necessary, the need for more tests. Vaccines  Your health care provider may recommend certain  vaccines, such as: Influenza vaccine. This is recommended every year. Tetanus, diphtheria, and acellular pertussis (Tdap, Td) vaccine. You may need a Td booster every 10 years. Zoster vaccine. You may need this after age 75. Pneumococcal 13-valent conjugate (PCV13) vaccine. One dose is recommended after age 51. Pneumococcal polysaccharide (PPSV23) vaccine. One dose is recommended after age 5. Talk to your health care provider about which screenings and vaccines you need and how often you need them. This information is not intended to replace advice given to you by your health care provider. Make sure you discuss any questions you have with your health care provider. Document Released: 11/28/2015 Document Revised: 07/21/2016 Document Reviewed: 09/02/2015 Elsevier Interactive Patient Education  2017 [place] Prevention in the Home Falls can cause injuries. They can happen to people of all ages. There are many things you can do to make your home safe and to help prevent falls. What can I do on the outside of my home? Regularly fix the edges of walkways and driveways and fix any cracks. Remove anything that might make you trip as you walk through a door, such as a raised step or threshold. Trim any bushes or trees on the path to your home. Use bright outdoor lighting. Clear any walking paths of anything that might make someone trip, such as rocks or tools. Regularly check to see if handrails are loose or broken. Make sure that both sides of any steps have handrails. Any raised decks and porches should have guardrails on the edges. Have any leaves, snow, or ice cleared regularly. Use sand or salt on walking paths during winter. Clean up any spills in your garage right away. This includes oil or grease spills. What can I do in the bathroom? Use night lights. Install grab bars by the toilet and in the tub and shower. Do not use towel bars as grab bars. Use non-skid mats or decals in  the tub or shower. If you need to sit down in the shower, use a plastic, non-slip stool. Keep the floor dry. Clean up any water that spills on the floor as soon as it happens. Remove soap buildup in the tub or shower regularly. Attach bath mats securely with double-sided non-slip rug tape. Do not have throw rugs and other things on the floor that can make you trip. What can I do in the bedroom? Use night lights. Make sure that you have a light by your bed that is easy to reach. Do not use any sheets or blankets that are too big for your bed. They should not hang down onto the floor. Have a firm chair that has side arms. You can use this for support while you get dressed. Do not have throw rugs and other things on the floor that can make you trip. What can I do in the kitchen? Clean up any spills right away. Avoid walking on wet floors. Keep items that you use a lot in easy-to-reach places. If you need to reach something above you, use a strong step stool that has a grab bar. Keep electrical cords out  of the way. Do not use floor polish or wax that makes floors slippery. If you must use wax, use non-skid floor wax. Do not have throw rugs and other things on the floor that can make you trip. What can I do with my stairs? Do not leave any items on the stairs. Make sure that there are handrails on both sides of the stairs and use them. Fix handrails that are broken or loose. Make sure that handrails are as long as the stairways. Check any carpeting to make sure that it is firmly attached to the stairs. Fix any carpet that is loose or worn. Avoid having throw rugs at the top or bottom of the stairs. If you do have throw rugs, attach them to the floor with carpet tape. Make sure that you have a light switch at the top of the stairs and the bottom of the stairs. If you do not have them, ask someone to add them for you. What else can I do to help prevent falls? Wear shoes that: Do not have high  heels. Have rubber bottoms. Are comfortable and fit you well. Are closed at the toe. Do not wear sandals. If you use a stepladder: Make sure that it is fully opened. Do not climb a closed stepladder. Make sure that both sides of the stepladder are locked into place. Ask someone to hold it for you, if possible. Clearly mark and make sure that you can see: Any grab bars or handrails. First and last steps. Where the edge of each step is. Use tools that help you move around (mobility aids) if they are needed. These include: Canes. Walkers. Scooters. Crutches. Turn on the lights when you go into a dark area. Replace any light bulbs as soon as they burn out. Set up your furniture so you have a clear path. Avoid moving your furniture around. If any of your floors are uneven, fix them. If there are any pets around you, be aware of where they are. Review your medicines with your doctor. Some medicines can make you feel dizzy. This can increase your chance of falling. Ask your doctor what other things that you can do to help prevent falls. This information is not intended to replace advice given to you by your health care provider. Make sure you discuss any questions you have with your health care provider. Document Released: 08/28/2009 Document Revised: 04/08/2016 Document Reviewed: 12/06/2014 Elsevier Interactive Patient Education  2017 [employer].

## 2022-09-15 ENCOUNTER — Ambulatory Visit: Payer: Medicare Other | Admitting: Psychology

## 2022-09-21 DIAGNOSIS — M25562 Pain in left knee: Secondary | ICD-10-CM | POA: Diagnosis not present

## 2022-09-23 ENCOUNTER — Ambulatory Visit (INDEPENDENT_AMBULATORY_CARE_PROVIDER_SITE_OTHER): Payer: Medicare Other | Admitting: Psychology

## 2022-09-23 DIAGNOSIS — F4321 Adjustment disorder with depressed mood: Secondary | ICD-10-CM | POA: Diagnosis not present
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
## 2022-09-29 DIAGNOSIS — M25562 Pain in left knee: Secondary | ICD-10-CM | POA: Diagnosis not present

## 2022-09-30 DIAGNOSIS — C44319 Basal cell carcinoma of skin of other parts of face: Secondary | ICD-10-CM | POA: Diagnosis not present

## 2022-09-30 DIAGNOSIS — D492 Neoplasm of unspecified behavior of bone, soft tissue, and skin: Secondary | ICD-10-CM | POA: Diagnosis not present

## 2022-10-04 ENCOUNTER — Ambulatory Visit (INDEPENDENT_AMBULATORY_CARE_PROVIDER_SITE_OTHER): Payer: Medicare Other | Admitting: Psychology

## 2022-10-04 DIAGNOSIS — F4321 Adjustment disorder with depressed mood: Secondary | ICD-10-CM

## 2022-10-04 DIAGNOSIS — M25562 Pain in left knee: Secondary | ICD-10-CM | POA: Diagnosis not present
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
## 2022-10-13 DIAGNOSIS — M25562 Pain in left knee: Secondary | ICD-10-CM | POA: Diagnosis not present

## 2022-10-14 ENCOUNTER — Ambulatory Visit: Payer: Medicare Other | Admitting: Family Medicine

## 2022-10-15 ENCOUNTER — Ambulatory Visit: Payer: Medicare Other | Admitting: Family Medicine

## 2022-10-21 ENCOUNTER — Ambulatory Visit: Payer: Medicare Other | Admitting: Family Medicine

## 2022-10-21 ENCOUNTER — Ambulatory Visit (INDEPENDENT_AMBULATORY_CARE_PROVIDER_SITE_OTHER): Payer: Medicare Other | Admitting: Psychology

## 2022-10-21 DIAGNOSIS — F4321 Adjustment disorder with depressed mood: Secondary | ICD-10-CM | POA: Diagnosis not present

## 2022-10-21 NOTE — Progress Notes (Signed)
Brenda Hudson is a 71 y.o. female patient    Date: 10/21/2022  Treatment Plan: Diagnosis U72.53 (Uncomplicated bereavement) [n/a]  F43.21 (Adjustment Disorder, With depressed mood) [n/a]  F41.1 (Generalized anxiety disorder) [n/a]  Symptoms Excessive and/or unrealistic worry that is difficult to control occurring more days than not for at least 6 months about a number of events or activities. (Status: maintained) -- No Description Entered  Strong emotional response of sadness exhibited when losses are discussed. (Status: maintained) -- No Description Entered  Thoughts dominated by loss coupled with poor concentration, tearful spells, and confusion about the future. (Status: maintained) -- No Description Entered  Medication Status na  Safety none  If Suicidal or Homicidal State Action Taken: unspecified  Current Risk: low Medications unspecified Objectives Related Problem: Complete the process of letting go of the lost significant other. Description: Verbalize resolution of feelings of guilt and regret associated with the loss. Target Date: 2023-04-24 Frequency: Daily Modality: individual Progress: 80%  Related Problem: Complete the process of letting go of the lost significant other. Description: Participate in a therapy that addresses issues beyond grief that have arisen as a result of the loss. Target Date: 2023-04-24 Frequency: Daily Modality: individual Progress: 90%  Related Problem: Learn and implement coping skills that result in a reduction of anxiety and worry, and improved daily functioning. Description: Learn and implement problem-solving strategies for realistically addressing worries. Target Date: 2023-04-24 Frequency: Daily Modality: individual Progress: 70%  Related Problem: Learn and implement coping skills that result in a reduction of anxiety and worry, and improved daily  functioning. Description: Learn and implement calming skills to reduce overall anxiety and manage anxiety symptoms. Target Date: 2023-04-24 Frequency: Daily Modality: individual Progress: 50%  Related Problem: Learn and implement coping skills that result in a reduction of anxiety and worry, and improved daily functioning. Description: Describe situations, thoughts, feelings, and actions associated with anxieties and worries, their impact on functioning, and attempts to resolve them. Target Date: 2023-04-24 Frequency: Daily Modality: individual Progress: 70%  Client Response full compliance  Service Location Location, [address] [place], [place] [address]  Service Code cpt [phone]  Lifestyle change (exercise, nutrition)  Self care activities  Provide education, information  Identify/label emotions  Facilitate problem solving  Normalize/Reframe  Validate/empathize  Related past to present  Comments  Adj. Disorder with Depression  Meds.: none  Goals: Keith lost her husband suddenly several years ago when he collapsed while jogging. She has struggled to adjust to this traumatic loss, which has left a devastating void in her life. She is hoping to use counseling as a way to re-define her life and find both purpose and happiness. She relied on husband for many things and she tends to suffer from a lack of confidence. She also relied on him to co-parent their daughter, who has some special needs and presents her with multiple challenges. She is also seeking additional guidance on parenting this adult daughter. Needs to adjust to having her daughter live with her, likely for the long-term. Focus on treatment evolving to address the complicated and conflictual relationship with daughter Kit. Moncerrath expresses many concerns/fears about this relationship, and is paralyzed by these fears and guilt in her efforts to effectively parent and set appropriate boundaries.  Goal date 6-24.   Patient agreed to a  video session. She is at her home and I am at my home office.   Jodiann says that everything that Kit needed to do for the surrogate is now complete. [name] is overwhelmed with the process. Kit is excited about the baby and the upcoming holiday. [name] says "I think I have dementia" because she always feels on edge and she cannot remember a lot of things. I told her that it is more likely that she is suffering significant anxiety. She admits that everything in her life is focused on Kit's needs and she has "no agency". Karen is getting ordained Saturday and she and Kit are going. [name] is committed to NOT missing this ordination. We discussed her avoidance of conflict, but the important need to assert herself.                                                                , LEWIS, PhD Time:5:10p-6:00p 50 minutes             

## 2022-10-27 DIAGNOSIS — M25562 Pain in left knee: Secondary | ICD-10-CM | POA: Diagnosis not present

## 2022-11-01 ENCOUNTER — Ambulatory Visit: Payer: Medicare Other | Admitting: Family Medicine

## 2022-11-02 DIAGNOSIS — S83232D Complex tear of medial meniscus, current injury, left knee, subsequent encounter: Secondary | ICD-10-CM | POA: Diagnosis not present

## 2022-11-04 ENCOUNTER — Ambulatory Visit: Payer: Medicare Other | Admitting: Psychology

## 2022-11-05 ENCOUNTER — Other Ambulatory Visit: Payer: Self-pay

## 2022-11-05 ENCOUNTER — Other Ambulatory Visit: Payer: Self-pay | Admitting: Physician Assistant

## 2022-11-05 ENCOUNTER — Telehealth: Payer: Self-pay | Admitting: Physician Assistant

## 2022-11-05 DIAGNOSIS — I341 Nonrheumatic mitral (valve) prolapse: Secondary | ICD-10-CM

## 2022-11-05 DIAGNOSIS — R002 Palpitations: Secondary | ICD-10-CM

## 2022-11-05 MED ORDER — METOPROLOL SUCCINATE ER 25 MG PO TB24: 25.0000 mg | ORAL_TABLET | Freq: Every day | ORAL | 0 refills | Status: DC

## 2022-11-05 NOTE — Telephone Encounter (Signed)
Pt c/o medication issue:  1. Name of Medication:   metoprolol succinate (TOPROL-XL) 25 MG 24 hr tablet    2. How are you currently taking this medication (dosage and times per day)?   TAKE 1 TABLET (25 MG TOTAL) BY MOUTH DAILY.    3. Are you having a reaction (difficulty breathing--STAT)? No  4. What is your medication issue? Pharmacy called stating that the medication prescribers license is coming back as inactive. Please advise

## 2022-11-18 ENCOUNTER — Ambulatory Visit (INDEPENDENT_AMBULATORY_CARE_PROVIDER_SITE_OTHER): Payer: Medicare Other | Admitting: Psychology

## 2022-11-18 DIAGNOSIS — F4321 Adjustment disorder with depressed mood: Secondary | ICD-10-CM
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
## 2022-11-19 ENCOUNTER — Encounter: Payer: Self-pay | Admitting: Family Medicine

## 2022-11-19 ENCOUNTER — Encounter: Payer: Self-pay | Admitting: Internal Medicine

## 2022-11-19 ENCOUNTER — Ambulatory Visit (INDEPENDENT_AMBULATORY_CARE_PROVIDER_SITE_OTHER): Payer: Medicare Other | Admitting: Family Medicine

## 2022-11-19 ENCOUNTER — Ambulatory Visit: Payer: Medicare Other | Attending: Internal Medicine | Admitting: Internal Medicine

## 2022-11-19 VITALS — BP 106/58 | HR 61 | Ht 62.5 in | Wt 111.4 lb

## 2022-11-19 VITALS — BP 105/60 | HR 54 | Temp 97.5°F | Resp 16 | Ht 63.0 in | Wt 111.0 lb

## 2022-11-19 DIAGNOSIS — I4719 Other supraventricular tachycardia: Secondary | ICD-10-CM

## 2022-11-19 DIAGNOSIS — R002 Palpitations: Secondary | ICD-10-CM

## 2022-11-19 DIAGNOSIS — J0111 Acute recurrent frontal sinusitis: Secondary | ICD-10-CM

## 2022-11-19 DIAGNOSIS — R0981 Nasal congestion: Secondary | ICD-10-CM | POA: Diagnosis not present

## 2022-11-19 DIAGNOSIS — I491 Atrial premature depolarization: Secondary | ICD-10-CM | POA: Diagnosis not present

## 2022-11-19 DIAGNOSIS — I493 Ventricular premature depolarization: Secondary | ICD-10-CM

## 2022-11-19 DIAGNOSIS — I341 Nonrheumatic mitral (valve) prolapse: Secondary | ICD-10-CM

## 2022-11-19 LAB — POCT INFLUENZA A/B

## 2022-11-19 LAB — POC COVID19 BINAXNOW: SARS Coronavirus 2 Ag: NEGATIVE

## 2022-11-19 MED ORDER — FLECAINIDE ACETATE 100 MG PO TABS: 100.0000 mg | ORAL_TABLET | Freq: Two times a day (BID) | ORAL | 0 refills | Status: DC

## 2022-11-19 MED ORDER — METOPROLOL SUCCINATE ER 25 MG PO TB24: 25.0000 mg | ORAL_TABLET | Freq: Every day | ORAL | 0 refills | Status: DC

## 2022-11-19 MED ORDER — AMOXICILLIN-POT CLAVULANATE 875-125 MG PO TABS: 1.0000 | ORAL_TABLET | Freq: Two times a day (BID) | ORAL | 0 refills | Status: AC

## 2022-11-19 NOTE — Progress Notes (Signed)
HPI Mrs. Conner returns for ongoing evaluation of palpitations and NS AT. She was seen by me several years ago and has been maintained on flecainide. She denies ETOH or caffeine abuse but admits to not sleeping as much as she should. She has just moved into a new house. She is anxious as her husband who was in very good health and worked out regularly died suddenly about 10 years ago. Her daughter who is a patient of mine has had syncope.   Allergies  Allergen Reactions   Cefaclor     REACTION: rash   Penicillins     REACTION: rash   Sulfonamide Derivatives     REACTION: swelling     Current Outpatient Medications  Medication Sig Dispense Refill   ACETAMINOPHEN PO Take by mouth as needed.     Cholecalciferol (VITAMIN D3) 50 MCG (2000 UT) CAPS Take by mouth.     Ibuprofen 200 MG CAPS Take 400 mg by mouth in the morning and at bedtime.      loratadine (CLARITIN) 10 MG tablet Take 10 mg by mouth daily as needed for allergies.     MELATONIN PO Take by mouth.     sertraline (ZOLOFT) 25 MG tablet TAKE 1 TABLET (25 MG TOTAL) BY MOUTH DAILY. 90 tablet 2   flecainide (TAMBOCOR) 100 MG tablet Take 1 tablet (100 mg total) by mouth 2 (two) times daily. 180 tablet 0   metoprolol succinate (TOPROL-XL) 25 MG 24 hr tablet Take 1 tablet (25 mg total) by mouth daily. 90 tablet 0   No current facility-administered medications for this visit.     Past Medical History:  Diagnosis Date   Allergy    Anxiety    Arthritis    Diverticulosis    Frequent PVCs    and PAC per pt/has had along time   GERD (gastroesophageal reflux disease)    Heart murmur    Hx of echocardiogram    a. Echo 12/13:  EF 55-60%, mild MR, normal diast fxn, no MVP   IBS (irritable bowel syndrome)    MVP (mitral valve prolapse)    Osteoporosis    Palpitations    Polymyalgia rheumatica (HCC)    Rectal pain    Spinal stenosis     ROS:   All systems reviewed and negative except as noted in the HPI.   Past  Surgical History:  Procedure Laterality Date   COLONOSCOPY     DILATION AND CURETTAGE OF UTERUS     TONSILLECTOMY     TUBAL LIGATION       Family History  Problem Relation Age of Onset   Sleep apnea Mother    Arthritis Mother    Diabetes Father        in 53s   Kidney disease Father        DM related, renal failure   Heart disease Father        CVA young age, Smoker, overweight   Colon polyps Father    Esophageal cancer Neg Hx    Rectal cancer Neg Hx    Stomach cancer Neg Hx    Colon cancer Neg Hx      Social History   Socioeconomic History   Marital status: Widowed    Spouse name: Not on file   Number of children: 2   Years of education: Not on file   Highest education level: Not on file  Occupational History   Occupation: Pharmacist, hospital  Comment: Special Education   Tobacco Use   Smoking status: Never   Smokeless tobacco: Never  Vaping Use   Vaping Use: Never used  Substance and Sexual Activity   Alcohol use: No   Drug use: No   Sexual activity: Not on file  Other Topics Concern   Not on file  Social History Narrative   Widowed in February 14, 2013 (husband sudden death MI despite being in great shape). 2 daughters. Oldest daughter pregnant in 09/2014-Phd student at [employer] in early [place]. Younger daughter [name] grad student [employer] [place] in [place].       [employer]. Both she and her husband. Husband went to [employer] for lawschool. 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, regular walking, reading, dogs (2) 6 horses    Social Determinants of Health   Financial Resource Strain: Low Risk  (09/09/2022)   Overall Financial Resource Strain (CARDIA)    Difficulty of Paying Living Expenses: Not hard at all  Food Insecurity: No Food Insecurity (09/09/2022)   Hunger Vital Sign    Worried About Running Out of Food in the Last  Year: Never true    Ran Out of Food in the Last Year: Never true  Transportation Needs: No Transportation Needs (09/09/2022)   PRAPARE - Hydrologist (Medical): No    Lack of Transportation (Non-Medical): No  Physical Activity: Sufficiently Active (09/09/2022)   Exercise Vital Sign    Days of Exercise per Week: 7 days    Minutes of Exercise per Session: 40 min  Stress: Stress Concern Present (09/09/2022)   [place]    Feeling of Stress : To some extent  Social Connections: Moderately Isolated (09/09/2022)   Social Connection and Isolation Panel [NHANES]    Frequency of Communication with Friends and Family: More than three times a week    Frequency of Social Gatherings with Friends and Family: More than three times a week    Attends Religious Services: More than 4 times per year    Active Member of [employer] or Organizations: No    Attends Archivist Meetings: Never    Marital Status: Widowed  Intimate Partner Violence: Not At Risk (09/09/2022)   Humiliation, Afraid, Rape, and Kick questionnaire    Fear of Current or Ex-Partner: No    Emotionally Abused: No    Physically Abused: No    Sexually Abused: No     BP (!) 106/58   Pulse 61   Ht 5' 2.5" (1.588 m)   Wt 111 lb 6.4 oz (50.5 kg)   SpO2 98%   BMI 20.05 kg/m   Physical Exam:  Well appearing NAD HEENT: Unremarkable Neck:  No JVD, no thyromegally Lymphatics:  No adenopathy Back:  No CVA tenderness Lungs:  Clear HEART:  Regular rate rhythm, no murmurs, no rubs, no clicks Abd:  soft, positive bowel sounds, no organomegally, no rebound, no guarding Ext:  2 plus pulses, no edema, no cyanosis, no clubbing Skin:  No rashes no nodules Neuro:  CN II through XII intact, motor grossly intact  EKG - nsr with PAC's  DEVICE  Normal device function.  See PaceArt for details.   Assess/Plan: PAC's - her symptoms are well  controlled. She will continue her current meds. Anxiety - she has much to  be worried about. She is managing her symptoms very well.  Sinus node dysfunction - her HR is slow at times but she is minimally if at all symptomatic. Carleene Overlie Lateya Dauria,MD

## 2022-11-19 NOTE — Patient Instructions (Addendum)
Medication Instructions:  Your physician recommends that you continue on your current medications as directed. Please refer to the Current Medication list given to you today.  Your Flecainide and Toprol XL has been refilled and sent to your pharmacy.  Lab Work: None ordered.  If you have labs (blood work) drawn today and your tests are completely normal, you will receive your results only by: [place] (if you have MyChart) OR A paper copy in the mail If you have any lab test that is abnormal or we need to change your treatment, we will call you to review the results.  Testing/Procedures: None ordered.  Follow-Up: At [facility], you and your health needs are our priority.  As part of our continuing mission to provide you with exceptional heart care, we have created designated Provider Care Teams.  These Care Teams include your primary Cardiologist (physician) and Advanced Practice Providers (APPs -  Physician Assistants and Nurse Practitioners) who all work together to provide you with the care you need, when you need it.  We recommend signing up for the patient portal called "MyChart".  Sign up information is provided on this After Visit Summary.  MyChart is used to connect with patients for Virtual Visits (Telemedicine).  Patients are able to view lab/test results, encounter notes, upcoming appointments, etc.  Non-urgent messages can be sent to your provider as well.   To learn more about what you can do with MyChart, go to [email or web address].    Your next appointment:   1 year(s)  The format for your next appointment:   In Person  Provider:   Cristopher Peru, MD{or one of the following Advanced Practice Providers on your designated Care Team:   Tommye Standard, [place] Legrand Como "Jonni Sanger" Chalmers Cater, [place]   Important Information About Sugar

## 2022-11-19 NOTE — Progress Notes (Signed)
Phone [phone] In person visit   Subjective:   Brenda Hudson is a 72 y.o. year old very pleasant female patient who presents for/with See problem oriented charting Chief Complaint  Patient presents with   Sinus Problem    Here for sinus problem    Past Medical History-  Patient Active Problem List   Diagnosis Date Noted   Spinal stenosis of lumbar region with neurogenic claudication 08/21/2019    Priority: High   PAC (premature atrial contraction) 09/10/2014    Priority: High   GAD (generalized anxiety disorder) 06/08/2022    Priority: Medium    Migraine 03/13/2020    Priority: Medium    Osteopenia 01/02/2020    Priority: Medium    Cervical spinal stenosis 08/21/2019    Priority: Medium    Generalized osteoarthritis of multiple sites 09/30/2021    Priority: Low   Headache, unspecified 08/21/2019    Priority: Low   PVC's (premature ventricular contractions) 04/04/2018    Priority: Low   Low back pain 09/30/2014    Priority: Low   IBS (irritable bowel syndrome) 09/30/2014    Priority: Low   Mitral valve prolapse 09/30/2014    Priority: Low   Palpitations 09/10/2014    Priority: Low   History of polymyalgia rheumatica 03/21/2008    Priority: Low   Atrial tachycardia 06/08/2022    Medications- reviewed and updated Current Outpatient Medications  Medication Sig Dispense Refill   ACETAMINOPHEN PO Take by mouth as needed.     amoxicillin-clavulanate (AUGMENTIN) 875-125 MG tablet Take 1 tablet by mouth 2 (two) times daily for 7 days. 14 tablet 0   Cholecalciferol (VITAMIN D3) 50 MCG (2000 UT) CAPS Take by mouth.     flecainide (TAMBOCOR) 100 MG tablet Take 1 tablet (100 mg total) by mouth 2 (two) times daily. 180 tablet 0   Ibuprofen 200 MG CAPS Take 400 mg by mouth in the morning and at bedtime.      loratadine (CLARITIN) 10 MG tablet Take 10 mg by mouth daily as needed for allergies.     MELATONIN PO Take by mouth.     metoprolol succinate (TOPROL-XL) 25  MG 24 hr tablet Take 1 tablet (25 mg total) by mouth daily. 90 tablet 0   sertraline (ZOLOFT) 25 MG tablet TAKE 1 TABLET (25 MG TOTAL) BY MOUTH DAILY. 90 tablet 2   No current facility-administered medications for this visit.     Objective:  BP 105/60 (BP Location: Right Arm, Patient Position: Sitting, Cuff Size: Normal)   Pulse (!) 54   Temp (!) 97.5 F (36.4 C) (Oral)   Resp 16   Ht '5\' 3"'$  (1.6 m)   Wt 111 lb (50.3 kg)   SpO2 96%   BMI 19.66 kg/m  Gen: NAD, resting comfortably Tympanic membrane's normal bilaterally, oropharynx largely normal other than some drainage noted on the pharynx, nasal turbinates erythematous and edematous with yellow discharge, frontal and maxillary sinuses extremely tender to palpation CV: RRR no murmurs rubs or gallops Lungs: CTAB no crackles, wheeze, rhonchi Ext: no edema Skin: warm, dry  Results for orders placed or performed in visit on 11/19/22 (from the past 24 hour(s))  POCT Influenza A/B     Status: Normal   Collection Time: 11/19/22  3:52 PM  Result Value Ref Range   Negative     Negative    POC COVID-19     Status: Normal   Collection Time: 11/19/22  3:53 PM  Result Value Ref Range  SARS Coronavirus 2 Ag Negative Negative       Assessment and Plan   # Sinus pressure/discharge S:symptoms started about 5 days ago ([place])- a lot of pressure in her cheeks. Bad sinus headache and significant drainage. Some sore throat with drainage. No fever. Feeling really run down.  Stable in last few days.  -no shortness of breath or chest pain -had been on prednisone and finished within 2 weeks - taking mucinex and advil for pain -doesn't tolerate neti pot- worsens pain -tried hot showers to loosen mucus -avoids decongestants A/P: Based on patient's history has a low probability of clearing viral sinusitis-frequently progresses to bacterial sinusitis but fortunately has been at least a year-significant level of sinus tenderness on exam is also  concerning -She has a listed penicillin allergy for rash but she has tolerated Augmentin in the past and would like to retry this over using doxycycline (if she needs either)-if needs antibiotic and develops rash should stop and let me know  From AVS "  Patient Instructions  Day 5 of illness today- your unfortunate usual course is to progress to clear bacterial sinusitis.  - since we are not open over the weekend- will go ahead and prescribe augmentin in case symptoms worsen over the weekend which would be a sign of secondary bacterial infection -other indication to start would be persistence to day 10 on the 10th of the month  Recommended follow up: Return for as needed for new, worsening, persistent symptoms. "  Recommended follow up: Return for as needed for new, worsening, persistent symptoms. Future Appointments  Date Time Provider [place]  12/02/2022  5:00 PM Oren Binet, PhD [facility] None  12/06/2022  2:20 PM [name], MD [facility]  12/16/2022  5:00 PM Oren Binet, PhD [facility] None  12/30/2022  5:00 PM Oren Binet, PhD [facility] None  01/13/2023  5:00 PM Oren Binet, PhD [facility] None  01/27/2023  5:00 PM Oren Binet, PhD [facility] None  01/28/2023  3:20 PM [name], MD [facility]  02/10/2023  5:00 PM Oren Binet, PhD [facility] None  02/24/2023  5:00 PM Oren Binet, PhD [facility] None  03/10/2023  5:00 PM Oren Binet, PhD [facility] None  03/24/2023  5:00 PM Oren Binet, PhD [facility] None  04/07/2023  5:00 PM Oren Binet, PhD [facility] None  04/21/2023  5:00 PM Oren Binet, PhD [facility] None  05/05/2023  5:00 PM Oren Binet, PhD [facility] None  09/15/2023  3:00 PM [facility] HEALTH COACH [facility]   Lab/Order associations:   ICD-10-CM   1. Head congestion  R09.81 POC COVID-19    POCT Influenza A/B    2. Acute recurrent frontal sinusitis  J01.11       Meds  ordered this encounter  Medications   amoxicillin-clavulanate (AUGMENTIN) 875-125 MG tablet    Sig: Take 1 tablet by mouth 2 (two) times daily for 7 days.    Dispense:  14 tablet    Refill:  0    Time Spent: 22 minutes of total time (4:10 PM- 4:32 PM) was spent on the date of the encounter performing the following actions: chart review prior to seeing the patient and during time with patient-particular reviewing prior sinusitis courses over the last 6 years, obtaining history, performing a medically necessary exam, counseling on the treatment plan as well as reasons for holding off on antibiotics at present but also indications to start taking, placing orders,  and documenting in our EHR.    Return precautions advised.  Garret Reddish, MD

## 2022-11-19 NOTE — Patient Instructions (Addendum)
Day 5 of illness today- your unfortunate usual course is to progress to clear bacterial sinusitis.  - since we are not open over the weekend- will go ahead and prescribe augmentin in case symptoms worsen over the weekend which would be a sign of secondary bacterial infection -other indication to start would be persistence to day 10 on the 10th of the month  Recommended follow up: Return for as needed for new, worsening, persistent symptoms.

## 2022-11-24 DIAGNOSIS — C44311 Basal cell carcinoma of skin of nose: Secondary | ICD-10-CM | POA: Diagnosis not present

## 2022-11-25 DIAGNOSIS — M25562 Pain in left knee: Secondary | ICD-10-CM | POA: Diagnosis not present

## 2022-12-02 ENCOUNTER — Ambulatory Visit: Payer: Medicare Other | Admitting: Psychology

## 2022-12-02 ENCOUNTER — Ambulatory Visit (INDEPENDENT_AMBULATORY_CARE_PROVIDER_SITE_OTHER): Payer: Medicare Other | Admitting: Psychology

## 2022-12-02 DIAGNOSIS — F4321 Adjustment disorder with depressed mood: Secondary | ICD-10-CM
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
## 2022-12-06 ENCOUNTER — Ambulatory Visit: Payer: Medicare Other | Admitting: Family Medicine

## 2022-12-10 ENCOUNTER — Ambulatory Visit (INDEPENDENT_AMBULATORY_CARE_PROVIDER_SITE_OTHER): Payer: Medicare Other | Admitting: Family Medicine

## 2022-12-10 ENCOUNTER — Encounter: Payer: Self-pay | Admitting: Family Medicine

## 2022-12-10 VITALS — BP 110/72 | HR 63 | Temp 97.8°F | Ht 63.0 in | Wt 113.2 lb

## 2022-12-10 DIAGNOSIS — B9689 Other specified bacterial agents as the cause of diseases classified elsewhere: Secondary | ICD-10-CM

## 2022-12-10 DIAGNOSIS — J329 Chronic sinusitis, unspecified: Secondary | ICD-10-CM

## 2022-12-10 DIAGNOSIS — R413 Other amnesia: Secondary | ICD-10-CM

## 2022-12-10 MED ORDER — DOXYCYCLINE HYCLATE 100 MG PO TABS: 100.0000 mg | ORAL_TABLET | Freq: Two times a day (BID) | ORAL | 0 refills | Status: AC

## 2022-12-10 NOTE — Patient Instructions (Addendum)
Memory test MMSE 30/30 today. We discussed for baseline with family history could still do neuropsychological eval if she would like but I certainly do not think this is required. I strongly suspect stress and pressure are the primarily players when she deals with forgetfulness    Recommended follow up: Return for next already scheduled visit or sooner if needed.

## 2022-12-10 NOTE — Progress Notes (Signed)
Phone [phone] In person visit   Subjective:   Brenda Hudson is a 72 y.o. year old very pleasant female patient who presents for/with See problem oriented charting Chief Complaint  Patient presents with   Memory Loss    Pt is here to discuss memory loss.   sinus infection    Pt states she was seen 3 weeks ago for sinus infection, she got better after the antibiotic but it has now flared back up with green mucous.   Abnormal ECG    Pt states she had EKG done by cardiology but results were not gone over with her and she saw results via mychart.   Past Medical History-  Patient Active Problem List   Diagnosis Date Noted   Spinal stenosis of lumbar region with neurogenic claudication 08/21/2019    Priority: High   PAC (premature atrial contraction) 09/10/2014    Priority: High   GAD (generalized anxiety disorder) 06/08/2022    Priority: Medium    Migraine 03/13/2020    Priority: Medium    Osteopenia 01/02/2020    Priority: Medium    Cervical spinal stenosis 08/21/2019    Priority: Medium    Generalized osteoarthritis of multiple sites 09/30/2021    Priority: Low   Headache, unspecified 08/21/2019    Priority: Low   PVC's (premature ventricular contractions) 04/04/2018    Priority: Low   Low back pain 09/30/2014    Priority: Low   IBS (irritable bowel syndrome) 09/30/2014    Priority: Low   Mitral valve prolapse 09/30/2014    Priority: Low   Palpitations 09/10/2014    Priority: Low   History of polymyalgia rheumatica 03/21/2008    Priority: Low   Atrial tachycardia 06/08/2022    Medications- reviewed and updated Current Outpatient Medications  Medication Sig Dispense Refill   ACETAMINOPHEN PO Take by mouth as needed.     Cholecalciferol (VITAMIN D3) 50 MCG (2000 UT) CAPS Take by mouth.     doxycycline (VIBRA-TABS) 100 MG tablet Take 1 tablet (100 mg total) by mouth 2 (two) times daily for 10 days. 20 tablet 0   flecainide (TAMBOCOR) 100 MG tablet Take 1  tablet (100 mg total) by mouth 2 (two) times daily. 180 tablet 0   Ibuprofen 200 MG CAPS Take 400 mg by mouth in the morning and at bedtime.      loratadine (CLARITIN) 10 MG tablet Take 10 mg by mouth daily as needed for allergies.     MELATONIN PO Take by mouth.     metoprolol succinate (TOPROL-XL) 25 MG 24 hr tablet Take 1 tablet (25 mg total) by mouth daily. 90 tablet 0   sertraline (ZOLOFT) 25 MG tablet TAKE 1 TABLET (25 MG TOTAL) BY MOUTH DAILY. 90 tablet 2   No current facility-administered medications for this visit.     Objective:  BP 110/72   Pulse 63   Temp 97.8 F (36.6 C)   Ht '5\' 3"'$  (1.6 m)   Wt 113 lb 3.2 oz (51.3 kg)   SpO2 97%   BMI 20.05 kg/m  Gen: NAD, resting comfortably      Assessment and Plan    # Sinus infection S:seen 11/19/22 and was 5-6 days into sinus infection . Given augmentin to take if symptoms failed to improve- she states started symptoms next day on the 11/20/22 as they were worsening further. She finished on 13th or 14th. Had at least 50% improvement until 2-3 days ago when it started raining- had severe  worsening of her symptoms particularly left frontal and maxillary sinus pressure. Mild sore throat. Yellow and green discharge started in last few days when had at least been clear.  -ended up being on prednisone for orthopedic issues but symptoms did not improve that much A/P: bacterial sinusitis with abotu 50% improvement on augmentin 7 day course- we opted to try 10 days of doxycycline and consider extending to 14 days if needed. Consider Sinus CT if fails to improve   # Memory Loss likely related to stress S:lives in a very stressful situation. Feels like she is always on guard/walking on eggshells. Sometimes hard to listen/intake as she is trying to think ahead of what to think next. Started centrum silver last week- 2 year delay in memory lock. She has considered prevagen -sees Dr. Cheryln Manly and another psychologist Lorriane Shire  -daughter gets  startled if she forgets something and she jumps on her heavily- makes her hyperaware of any potential memory loss -daughter will not write dates/times down for her -also worries as mother had memory issues -she does not feel particularly like she has memory loss outside of the stressful situations- really heightened with daughter  -feels she doesn't listen as well with other people as well   A/P: MMSE 30/30 today. We discussed for baseline with family history could still do neuropsychological eval if she would like but I certainly do not think this is required. I strongly suspect stress and pressure are the primarily players when she deals with forgetfulness    # EKG findings- went over each result from her last EKG report to explain more in depth  Recommended follow up: Return for next already scheduled visit or sooner if needed. Future Appointments  Date Time Provider [place]  12/30/2022  5:00 PM Oren Binet, PhD [facility] None  01/13/2023  5:00 PM Oren Binet, PhD [facility] None  01/27/2023  5:00 PM Oren Binet, PhD [facility] None  01/28/2023  3:20 PM [name], MD [facility]  02/10/2023  5:00 PM Oren Binet, PhD [facility] None  02/24/2023  5:00 PM Oren Binet, PhD [facility] None  03/10/2023  5:00 PM Oren Binet, PhD [facility] None  03/24/2023  5:00 PM Oren Binet, PhD [facility] None  04/07/2023  5:00 PM Oren Binet, PhD [facility] None  04/21/2023  5:00 PM Oren Binet, PhD [facility] None  05/05/2023  5:00 PM Oren Binet, PhD [facility] None  09/15/2023  3:00 PM [facility] HEALTH COACH [facility]    Lab/Order associations:   ICD-10-CM   1. Memory changes  R41.3     2. Bacterial sinusitis  J32.9    B96.89       Meds ordered this encounter  Medications   doxycycline (VIBRA-TABS) 100 MG tablet    Sig: Take 1 tablet (100 mg total) by mouth 2 (two) times daily for 10 days.    Dispense:  20  tablet    Refill:  0   Time Spent: 33 minutes of total time (4:42 PM-5:15 PM) was spent on the date of the encounter performing the following actions: chart review prior to seeing the patient, obtaining history, performing a medically necessary exam, counseling on the stresses she deals with as well as potential workup as well as discussing potential lack of need for workup with reassuring scores-also discussing sinusitis and EKG changes, placing orders, and documenting in our EHR.    Return precautions advised.  Garret Reddish, MD

## 2022-12-13 ENCOUNTER — Ambulatory Visit: Payer: Medicare Other | Admitting: Psychology

## 2022-12-15 DIAGNOSIS — M25562 Pain in left knee: Secondary | ICD-10-CM | POA: Diagnosis not present

## 2022-12-16 ENCOUNTER — Ambulatory Visit: Payer: Medicare Other | Admitting: Psychology

## 2022-12-30 ENCOUNTER — Ambulatory Visit (INDEPENDENT_AMBULATORY_CARE_PROVIDER_SITE_OTHER): Payer: Medicare Other | Admitting: Psychology

## 2022-12-30 DIAGNOSIS — F4321 Adjustment disorder with depressed mood: Secondary | ICD-10-CM

## 2022-12-30 NOTE — Progress Notes (Signed)
GERTIS SORY is a 72 y.o. female patient    Date: 12/30/2022  Treatment Plan: Diagnosis [id number] (Uncomplicated bereavement) [n/a]  F43.21 (Adjustment Disorder, With depressed mood) [n/a]  F41.1 (Generalized anxiety disorder) [n/a]  Symptoms Excessive and/or unrealistic worry that is difficult to control occurring more days than not for at least 6 months about a number of events or activities. (Status: maintained) -- No Description Entered  Strong emotional response of sadness exhibited when losses are discussed. (Status: maintained) -- No Description Entered  Thoughts dominated by loss coupled with poor concentration, tearful spells, and confusion about the future. (Status: maintained) -- No Description Entered  Medication Status na  Safety none  If Suicidal or Homicidal State Action Taken: unspecified  Current Risk: low Medications unspecified Objectives Related Problem: Complete the process of letting go of the lost significant other. Description: Verbalize resolution of feelings of guilt and regret associated with the loss. Target Date: 2023-04-24 Frequency: Daily Modality: individual Progress: 80%  Related Problem: Complete the process of letting go of the lost significant other. Description: Participate in a therapy that addresses issues beyond grief that have arisen as a result of the loss. Target Date: 2023-04-24 Frequency: Daily Modality: individual Progress: 90%  Related Problem: Learn and implement coping skills that result in a reduction of anxiety and worry, and improved daily functioning. Description: Learn and implement problem-solving strategies for realistically addressing worries. Target Date: 2023-04-24 Frequency: Daily Modality: individual Progress: 70%  Related Problem: Learn and implement coping skills that result in a reduction of anxiety and worry, and improved daily  functioning. Description: Learn and implement calming skills to reduce overall anxiety and manage anxiety symptoms. Target Date: 2023-04-24 Frequency: Daily Modality: individual Progress: 50%  Related Problem: Learn and implement coping skills that result in a reduction of anxiety and worry, and improved daily functioning. Description: Describe situations, thoughts, feelings, and actions associated with anxieties and worries, their impact on functioning, and attempts to resolve them. Target Date: 2023-04-24 Frequency: Daily Modality: individual Progress: 70%  Client Response full compliance  Service Location Location, [address] [place],  [address]  Service Code cpt [id number]  Lifestyle change (exercise, nutrition)  Self care activities  Provide education, information  Identify/label emotions  Facilitate problem solving  Normalize/Reframe  Validate/empathize  Related past to present  Comments  Adj. Disorder with Depression Meds.: none  Goals: Dody lost her husband suddenly several years ago when he collapsed while jogging. She has struggled to adjust to this traumatic loss, which has left a devastating void in her life. She is hoping to use counseling as a way to re-define her life and find both purpose and happiness. She relied on husband for many things and she tends to suffer from a lack of confidence. She also relied on him to co-parent their daughter, who has some special needs and presents her with multiple challenges. She is also seeking additional guidance on parenting this adult daughter. Needs to adjust to having her daughter live with her, likely for the long-term. Focus on treatment evolving to address the complicated and conflictual relationship with daughter Kit. Linder expresses many concerns/fears about this relationship, and is paralyzed by these fears and guilt in her efforts to effectively parent and set appropriate boundaries.  Goal date 6-24.   Patient agreed to a video  session. She is at her home and I am at my home office.   Asialyn says that the counselor at the group therapy for assertiveness has been helping her be more direct with Kit. The counselor want to speak with me and Jeniya is fine with the two of [place] talking.She has been talking with Luvenia Starch every other week. They role play talking with Kit to help empower her to overcome her guilt. Kit has told her that she needs to write her an apology letter or "she is out". She has a list of several things that she needs to address with Kit that Luvenia Starch is helping her script. These topics include finances, tone of conversation/respect, respecting [place] in front of a child). Has been seeing Luvenia Starch since the class ended in November. I will call Luvenia Starch to give her address. Sahian is experience so much guilt and shame that it is paralyzing. She feels she has been unfair to Reginal Lutes and herself because of her bad decisions. Like her mother that supported Ersie's brother, she has not set any financial limits and has spent down the money that [place] worked so hard to obtain.                                                                           [name], PhD Time:4:15p-5:00p 45 minutes

## 2023-01-13 ENCOUNTER — Ambulatory Visit (INDEPENDENT_AMBULATORY_CARE_PROVIDER_SITE_OTHER): Payer: Medicare Other | Admitting: Psychology

## 2023-01-13 DIAGNOSIS — F4321 Adjustment disorder with depressed mood: Secondary | ICD-10-CM

## 2023-01-13 NOTE — Progress Notes (Signed)
Brenda Hudson is a 72 y.o. female patient    Date: 01/13/2023  Treatment Plan: Diagnosis [id number] (Uncomplicated bereavement) [n/a]  F43.21 (Adjustment Disorder, With depressed mood) [n/a]  F41.1 (Generalized anxiety disorder) [n/a]  Symptoms Excessive and/or unrealistic worry that is difficult to control occurring more days than not for at least 6 months about a number of events or activities. (Status: maintained) -- No Description Entered  Strong emotional response of sadness exhibited when losses are discussed. (Status: maintained) -- No Description Entered  Thoughts dominated by loss coupled with poor concentration, tearful spells, and confusion about the future. (Status: maintained) -- No Description Entered  Medication Status na  Safety none  If Suicidal or Homicidal State Action Taken: unspecified  Current Risk: low Medications unspecified Objectives Related Problem: Complete the process of letting go of the lost significant other. Description: Verbalize resolution of feelings of guilt and regret associated with the loss. Target Date: 2023-04-24 Frequency: Daily Modality: individual Progress: 80%  Related Problem: Complete the process of letting go of the lost significant other. Description: Participate in a therapy that addresses issues beyond grief that have arisen as a result of the loss. Target Date: 2023-04-24 Frequency: Daily Modality: individual Progress: 90%  Related Problem: Learn and implement coping skills that result in a reduction of anxiety and worry, and improved daily functioning. Description: Learn and implement problem-solving strategies for realistically addressing worries. Target Date: 2023-04-24 Frequency: Daily Modality: individual Progress: 70%  Related Problem: Learn and implement coping skills that result in a reduction of anxiety and worry, and improved daily functioning. Description: Learn and implement calming skills to reduce overall  anxiety and manage anxiety symptoms. Target Date: 2023-04-24 Frequency: Daily Modality: individual Progress: 50%  Related Problem: Learn and implement coping skills that result in a reduction of anxiety and worry, and improved daily functioning. Description: Describe situations, thoughts, feelings, and actions associated with anxieties and worries, their impact on functioning, and attempts to resolve them. Target Date: 2023-04-24 Frequency: Daily Modality: individual Progress: 70%  Client Response full compliance  Service Location Location, [address] [place], [place] [address]  Service Code cpt [phone]  Lifestyle change (exercise, nutrition)  Self care activities  Provide education, information  Identify/label emotions  Facilitate problem solving  Normalize/Reframe  Validate/empathize  Related past to present  Comments  Adj. Disorder with Depression Meds.: none  Goals: Brenda Hudson lost her husband suddenly several years ago when he collapsed while jogging. She has struggled to adjust to this traumatic loss, which has left a devastating void in her life. She is hoping to use counseling as a way to re-define her life and find both purpose and happiness. She relied on husband for many things and she tends to suffer from a lack of confidence. She also relied on him to co-parent their daughter, who has some special needs and presents her with multiple challenges. She is also seeking additional guidance on parenting this adult daughter. Needs to adjust to having her daughter live with her, likely for the long-term. Focus on treatment evolving to address the complicated and conflictual relationship with daughter Brenda Hudson. Brenda Hudson expresses many concerns/fears about this relationship, and is paralyzed by these fears and guilt in her efforts to effectively parent and set appropriate boundaries.  Goal date 6-24.  Patient agreed to a video session. She is at her home and I am at my home office.    Prabhnoor says there have been a few things since our last session. Two times  she and Brenda Hudson had conflicts. On a day when she went to go help daughter Brenda Hudson. When she told Brenda Hudson that she planned to come home at 6pm, Brenda Hudson "blew up". Brenda Hudson felt terrible and came home early. Brenda Hudson yelled at her and Schwanda says "she intimidates me and I give in every time". We talked about how best to approach Brenda Hudson. Brenda Hudson's inability to assert herself with Brenda Hudson continues to create extreme stress and diminished self esteem.  Provider consulted with the group therapist, Corey Skains, that Brenda Hudson has been meeting with to help her script dialogue with Brenda Hudson. We collaborated on approach to help Brenda Hudson in her interactions with Brenda Hudson.                                                                                Brenda Morel, PhD Time:5:15p-6:00p 45 minutes

## 2023-01-17 DIAGNOSIS — S83232D Complex tear of medial meniscus, current injury, left knee, subsequent encounter: Secondary | ICD-10-CM | POA: Diagnosis not present

## 2023-01-21 ENCOUNTER — Other Ambulatory Visit: Payer: Self-pay | Admitting: Family Medicine

## 2023-01-27 ENCOUNTER — Ambulatory Visit (INDEPENDENT_AMBULATORY_CARE_PROVIDER_SITE_OTHER): Payer: Medicare Other | Admitting: Psychology

## 2023-01-27 DIAGNOSIS — F4321 Adjustment disorder with depressed mood: Secondary | ICD-10-CM | POA: Diagnosis not present

## 2023-01-27 NOTE — Progress Notes (Signed)
Brenda Hudson is a 72 y.o. female patient    Date: 01/27/2023  Treatment Plan: Diagnosis [id number] (Uncomplicated bereavement) [n/a]  F43.21 (Adjustment Disorder, With depressed mood) [n/a]  F41.1 (Generalized anxiety disorder) [n/a]  Symptoms Excessive and/or unrealistic worry that is difficult to control occurring more days than not for at least 6 months about a number of events or activities. (Status: maintained) -- No Description Entered  Strong emotional response of sadness exhibited when losses are discussed. (Status: maintained) -- No Description Entered  Thoughts dominated by loss coupled with poor concentration, tearful spells, and confusion about the future. (Status: maintained) -- No Description Entered  Medication Status na  Safety none  If Suicidal or Homicidal State Action Taken: unspecified  Current Risk: low Medications unspecified Objectives Related Problem: Complete the process of letting go of the lost significant other. Description: Verbalize resolution of feelings of guilt and regret associated with the loss. Target Date: 2023-04-24 Frequency: Daily Modality: individual Progress: 80%  Related Problem: Complete the process of letting go of the lost significant other. Description: Participate in a therapy that addresses issues beyond grief that have arisen as a result of the loss. Target Date: 2023-04-24 Frequency: Daily Modality: individual Progress: 90%  Related Problem: Learn and implement coping skills that result in a reduction of anxiety and worry, and improved daily functioning. Description: Learn and implement problem-solving strategies for realistically addressing worries. Target Date: 2023-04-24 Frequency: Daily Modality: individual Progress: 70%  Related Problem: Learn and implement coping skills that result in a reduction of anxiety and worry, and improved daily functioning. Description: Learn and implement  calming skills to reduce overall anxiety and manage anxiety symptoms. Target Date: 2023-04-24 Frequency: Daily Modality: individual Progress: 50%  Related Problem: Learn and implement coping skills that result in a reduction of anxiety and worry, and improved daily functioning. Description: Describe situations, thoughts, feelings, and actions associated with anxieties and worries, their impact on functioning, and attempts to resolve them. Target Date: 2023-04-24 Frequency: Daily Modality: individual Progress: 70%  Client Response full compliance  Service Location Location, [address] [place], [place] [address]  Service Code cpt [phone]  Lifestyle change (exercise, nutrition)  Self care activities  Provide education, information  Identify/label emotions  Facilitate problem solving  Normalize/Reframe  Validate/empathize  Related past to present  Comments  Adj. Disorder with Depression Meds.: none  Goals: Brenda Hudson lost her husband suddenly several years ago when he collapsed while jogging. She has struggled to adjust to this traumatic loss, which has left a devastating void in her life. She is hoping to use counseling as a way to re-define her life and find both purpose and happiness. She relied on husband for many things and she tends to suffer from a lack of confidence. She also relied on him to co-parent their daughter, who has some special needs and presents her with multiple challenges. She is also seeking additional guidance on parenting this adult daughter. Needs to adjust to having her daughter live with her, likely for the long-term. Focus on treatment evolving to address the complicated and conflictual relationship with daughter Brenda Hudson. Brenda Hudson expresses many concerns/fears about this relationship, and is paralyzed by these fears and guilt in her efforts to effectively parent and set appropriate boundaries.  Goal date 6-24.  Patient agreed to a video session. She is at her home  and I am at my home office.  Brenda Hudson says Brenda Hudson is having some significant shoulder problems. She may need surgery, but that is unclear. Also, the surrogate is delayed by 6 weeks because she got the wrong medications instructions.Brenda Hudson is experiencing grief because this Spring is the 10 year anniversary of Brenda Hudson's death. She is reflecting on a lot of the "what ifs". Brenda Hudson has been talking a lot with Brenda Hudson, which is now seeing a therapist. She things they have worked through a lot of the stress in their relationship. Brenda Hudson tells her "I got the short end of the stick". I talked with her about having discussions with Brenda Hudson about what to expect when the baby comes.                                                                                      Brenda Morel, PhD Time:5:15p-6:00p 45 minutes

## 2023-01-28 ENCOUNTER — Ambulatory Visit (INDEPENDENT_AMBULATORY_CARE_PROVIDER_SITE_OTHER): Payer: Medicare Other | Admitting: Family Medicine

## 2023-01-28 ENCOUNTER — Encounter: Payer: Self-pay | Admitting: Family Medicine

## 2023-01-28 VITALS — BP 120/72 | HR 56 | Temp 97.0°F | Ht 63.0 in | Wt 112.6 lb

## 2023-01-28 DIAGNOSIS — E785 Hyperlipidemia, unspecified: Secondary | ICD-10-CM

## 2023-01-28 DIAGNOSIS — G4452 New daily persistent headache (NDPH): Secondary | ICD-10-CM | POA: Diagnosis not present

## 2023-01-28 DIAGNOSIS — J329 Chronic sinusitis, unspecified: Secondary | ICD-10-CM | POA: Diagnosis not present

## 2023-01-28 DIAGNOSIS — H5712 Ocular pain, left eye: Secondary | ICD-10-CM | POA: Diagnosis not present

## 2023-01-28 MED ORDER — DOXYCYCLINE HYCLATE 100 MG PO TABS: 100.0000 mg | ORAL_TABLET | Freq: Two times a day (BID) | ORAL | 0 refills | Status: AC

## 2023-01-28 NOTE — Patient Instructions (Addendum)
Sign release of information at the check out desk for last mammogram  We will call you within two weeks about your referral to MRI x2  through [facility].  Their phone number is [phone].  Please call them if you have not heard in 1-2 weeks  See your eye doctor/optho ASAP  We will either call you (or see alternate below) within two weeks about your referral to ENT . Our referral specialist will sometimes also send you a mychart link once referral is approved and then you will call the # listed on there (let us know if you do not see this within 2 weeks or have not received call) -trial doxycycline for 14 days  Please stop by lab before you go If you have mychart- we will send your results within 3 business days of [place] receiving them.  If you do not have mychart- we will call you about results within 5 business days of [place] receiving them.  *please also note that you will see labs on mychart as soon as they post. I will later go in and write notes on them- will say "notes from Dr. Yong Channel"   Recommended follow up: Return in about 6 months (around 07/31/2023) for followup or sooner if needed.Schedule b4 you leave.

## 2023-01-28 NOTE — Progress Notes (Signed)
Phone [phone] In person visit   Subjective:   Brenda Hudson is a 72 y.o. year old very pleasant female patient who presents for/with See problem oriented charting Chief Complaint  Patient presents with   Annual Exam   Sinus Problem    Pt c/o chronic sinus problems   chronic eye pain    Pt c/o chronic constant pain in left eye above and through her eye, vision has not been affected and it radiates to her cheek bone and forehead.   Back Pain    Pt c/o back and neck pain due to arthritis     Past Medical History-  Patient Active Problem List   Diagnosis Date Noted   Spinal stenosis of lumbar region with neurogenic claudication 08/21/2019    Priority: High   PAC (premature atrial contraction) 09/10/2014    Priority: High   GAD (generalized anxiety disorder) 06/08/2022    Priority: Medium    Migraine 03/13/2020    Priority: Medium    Osteopenia 01/02/2020    Priority: Medium    Cervical spinal stenosis 08/21/2019    Priority: Medium    Generalized osteoarthritis of multiple sites 09/30/2021    Priority: Low   Headache, unspecified 08/21/2019    Priority: Low   PVC's (premature ventricular contractions) 04/04/2018    Priority: Low   Low back pain 09/30/2014    Priority: Low   IBS (irritable bowel syndrome) 09/30/2014    Priority: Low   Mitral valve prolapse 09/30/2014    Priority: Low   Palpitations 09/10/2014    Priority: Low   History of polymyalgia rheumatica 03/21/2008    Priority: Low   Atrial tachycardia 06/08/2022    Medications- reviewed and updated Current Outpatient Medications  Medication Sig Dispense Refill   ACETAMINOPHEN PO Take by mouth as needed.     Cholecalciferol (VITAMIN D3) 50 MCG (2000 UT) CAPS Take by mouth.     doxycycline (VIBRA-TABS) 100 MG tablet Take 1 tablet (100 mg total) by mouth 2 (two) times daily for 14 days. 28 tablet 0   flecainide (TAMBOCOR) 100 MG tablet Take 1 tablet (100 mg total) by mouth 2 (two) times daily. 180  tablet 0   Ibuprofen 200 MG CAPS Take 400 mg by mouth in the morning and at bedtime.      loratadine (CLARITIN) 10 MG tablet Take 10 mg by mouth daily as needed for allergies.     MELATONIN PO Take by mouth.     metoprolol succinate (TOPROL-XL) 25 MG 24 hr tablet Take 1 tablet (25 mg total) by mouth daily. 90 tablet 0   sertraline (ZOLOFT) 25 MG tablet TAKE 1 TABLET (25 MG TOTAL) BY MOUTH DAILY. 90 tablet 2   No current facility-administered medications for this visit.     Objective:  BP 120/72   Pulse (!) 56   Temp (!) 97 F (36.1 C)   Ht 5\' 3"  (1.6 m)   Wt 112 lb 9.6 oz (51.1 kg)   SpO2 100%   BMI 19.95 kg/m  Gen: NAD, resting comfortably CV: RRR no murmurs rubs or gallops Lungs: CTAB no crackles, wheeze, rhonchi Abdomen: soft/nontender/nondistended/normal bowel sounds. No rebound or guarding.  Ext: no edema Skin: warm, dry Neuro: grossly normal, moves all extremities     Assessment and Plan   # Chronic sinus problems S: Patient with ongoing issues with her sinuses.  Most recently in January was treated with 14-day course of doxycycline (another about 50%) after only about 50%  improvement on Augmentin early in January.  We had discussed doing a sinus CT if fail to improve at that time.  In recent weeks has worsened. Doing mucinex, loratadine still and Advil, tylenol  A/P: Chronic sinus pain that improves with antibiotic courses- potentially has bacterial element. Considered ct of sinuses but with upcoming MR brain and MR orbits as below we opted out. Will treat 14 days doxycycline again- I think referral to ENT if comes back is the right next step- due to delays in ENT referrals recently up to 2-3 months gave her the option of referring now- she is ok with this. Also mentioned levaquin- she is hesitant with side effects   # Chronic right headache/right eye pain S:Patient reports chronic pain in the left eye and through her eye- states has been going on at least 3 month-  simply didn't mention last visit- eye pain did not improve with antibiotic (tylenol and ibuprofen combo does help).   no change in vision.  Also notes pain in her cheek and forehead . No left eye tearing or left notril rhinorrhea with pain episodes A/P: with age over 61 and new headache pattern as well as eye pain without vision change- advised MR brain and Mr orbits. Could also refer to neurology- wonder about migraines -also with chronic need for tylenol and ibuprofen with neck/back/sinus issues could be medication overuse  or rebound related  -we discussed esr/crp but with location of pain and duration without vision change doubt temporal arteritis  - also needs to see eye doctor/optho   # arthritis S:  issues with arthritis in hands (ongoing) and knees in the past. Also neck and back pain issues. Saw Dr. Rita Ohara 3 years ago who  -more hypertrophy of hand joints overtime and some ulnar deviation beyond PIP joint. Getting more and more difficult- using voltaren gel. Still some tylenol ibuprofen A/P: Dr. Rita Ohara has retired- for cervical and lumbar spine issues she is going to call them back and schedule- I can place referral if needed for her- declines today -with nsaid use monitor renal function  # GI concerns- better lately see prior notes  % PACs/PVCs-managed by Dr. Leticia Penna S:Patient is compliant with flecainide 100 mg twice daily and metoprolol 25 mg 24-hour tablet. Dr. Lovena Le. No recent issues A/P: PACs/PVCs- doing well- continue current medications    # Memory changes-a lot of stress with her home situation.  MMSE 30 out of 30 last visit.  Family history of dementia.  Offered neuropsychological eval but she declined last visit. She opted out  # stress management S: Medication:sertraline 25 mg- at least takes edge off  -also still working with Dr. Cheryln Manly- very helpful for 7-8 years    12/10/2022    4:08 PM 11/19/2022    3:22 PM 09/09/2022    2:34 PM  Depression screen PHQ  2/9  Decreased Interest 0 0 0  Down, Depressed, Hopeless 0 0 0  PHQ - 2 Score 0 0 0  Altered sleeping 0    Tired, decreased energy 1    Change in appetite 0    Feeling bad or failure about yourself  0    Trouble concentrating 0    Moving slowly or fidgety/restless 0    Suicidal thoughts 0    PHQ-9 Score 1    Difficult doing work/chores Not difficult at all    A/P: reasonable control- continue current medications  #hyperlipidemia S: Medication:none  Lab Results  Component Value Date   CHOL 197  08/26/2021   HDL 81.30 08/26/2021   LDLCALC 103 (H) 08/26/2021   LDLDIRECT 149.9 01/20/2011   TRIG 62.0 08/26/2021   CHOLHDL 2 08/26/2021   A/P: very mild elevations- update lipids and 10 year ascvd risk  #Health maintenance  Dermatology evaluation- had basal cell removed recently Had mammogram in 2023- due soon- we need a copy of this 09/2019 with 10 year repeat on colonoscopy Past age based screening recommendations for pap smears- still sees GYN Dr. Julien Girt and they make decisions Osteopenia follow up with GYN   Recommended follow up: Return in about 6 months (around 07/31/2023) for followup or sooner if needed.Schedule b4 you leave. Future Appointments  Date Time Provider [place]  02/10/2023  5:00 PM Oren Binet, PhD [facility] None  02/24/2023  5:00 PM Oren Binet, PhD [facility] None  03/10/2023  5:00 PM Oren Binet, PhD [facility] None  03/24/2023  5:00 PM Oren Binet, PhD [facility] None  04/07/2023  5:00 PM Oren Binet, PhD [facility] None  04/21/2023  5:00 PM Oren Binet, PhD [facility] None  05/05/2023  5:00 PM Oren Binet, PhD [facility] None  09/15/2023  3:00 PM [facility] ANNUAL WELLNESS VISIT 1 [facility]    Lab/Order associations: NOT fasting   ICD-10-CM   1. Chronic sinusitis, unspecified location  J32.9 Ambulatory referral to ENT    2. Left eye pain  H57.12 MR Brain Wo Contrast    MR ORBITS WO CONTRAST    3.  New daily persistent headache  G44.52 MR Brain Wo Contrast    MR ORBITS WO CONTRAST    4. Hyperlipidemia, unspecified hyperlipidemia type  E78.5 Comprehensive metabolic panel    CBC with Differential/Platelet    Lipid panel      Meds ordered this encounter  Medications   doxycycline (VIBRA-TABS) 100 MG tablet    Sig: Take 1 tablet (100 mg total) by mouth 2 (two) times daily for 14 days.    Dispense:  28 tablet    Refill:  0   Time Spent: 49 minutes of total time (3:10 PM - 3:59 PM) was spent on the date of the encounter performing the following actions: chart review prior to seeing the patient, obtaining history, performing a medically necessary exam, counseling on the treatment plan, placing orders, and documenting in our EHR.    Return precautions advised.  Garret Reddish, MD

## 2023-01-29 LAB — CBC WITH DIFFERENTIAL/PLATELET
Absolute Monocytes: 464 cells/uL (ref 200–950)
Basophils Absolute: 18 cells/uL (ref 0–200)
Basophils Relative: 0.3 %
Eosinophils Absolute: 140 cells/uL (ref 15–500)
Eosinophils Relative: 2.3 %
HCT: 35.8 % (ref 35.0–45.0)
Hemoglobin: 12.2 g/dL (ref 11.7–15.5)
Lymphs Abs: 1488 cells/uL (ref 850–3900)
MCH: 31 pg (ref 27.0–33.0)
MCHC: 34.1 g/dL (ref 32.0–36.0)
MCV: 91.1 fL (ref 80.0–100.0)
MPV: 10.8 fL (ref 7.5–12.5)
Monocytes Relative: 7.6 %
Neutro Abs: 3989 cells/uL (ref 1500–7800)
Neutrophils Relative %: 65.4 %
Platelets: 279 10*3/uL (ref 140–400)
RBC: 3.93 10*6/uL (ref 3.80–5.10)
RDW: 13 % (ref 11.0–15.0)
Total Lymphocyte: 24.4 %
WBC: 6.1 10*3/uL (ref 3.8–10.8)

## 2023-01-29 LAB — COMPREHENSIVE METABOLIC PANEL
AG Ratio: 1.7 (calc) (ref 1.0–2.5)
ALT: 18 U/L (ref 6–29)
AST: 22 U/L (ref 10–35)
Albumin: 4.1 g/dL (ref 3.6–5.1)
Alkaline phosphatase (APISO): 33 U/L — ABNORMAL LOW (ref 37–153)
BUN: 23 mg/dL (ref 7–25)
CO2: 27 mmol/L (ref 20–32)
Calcium: 9.1 mg/dL (ref 8.6–10.4)
Chloride: 104 mmol/L (ref 98–110)
Creat: 0.81 mg/dL (ref 0.50–1.05)
Globulin: 2.4 g/dL (calc) (ref 1.9–3.7)
Glucose, Bld: 106 mg/dL — ABNORMAL HIGH (ref 65–99)
Potassium: 4.5 mmol/L (ref 3.5–5.3)
Sodium: 141 mmol/L (ref 135–146)
Total Bilirubin: 0.3 mg/dL (ref 0.2–1.2)
Total Protein: 6.5 g/dL (ref 6.1–8.1)

## 2023-01-29 LAB — LIPID PANEL
Cholesterol: 213 mg/dL — ABNORMAL HIGH (ref <200)
HDL: 70 mg/dL (ref >=50)
LDL Cholesterol (Calc): 116 mg/dL (calc) — ABNORMAL HIGH
Non-HDL Cholesterol (Calc): 143 mg/dL (calc) — ABNORMAL HIGH (ref <130)
Total CHOL/HDL Ratio: 3 (calc) (ref <5.0)
Triglycerides: 159 mg/dL — ABNORMAL HIGH (ref <150)

## 2023-02-10 ENCOUNTER — Ambulatory Visit (INDEPENDENT_AMBULATORY_CARE_PROVIDER_SITE_OTHER): Payer: Medicare Other | Admitting: Psychology

## 2023-02-10 DIAGNOSIS — F4321 Adjustment disorder with depressed mood: Secondary | ICD-10-CM | POA: Diagnosis not present

## 2023-02-10 NOTE — Progress Notes (Signed)
Brenda Hudson is a 72 y.o. female patient    Date: 02/10/2023  Treatment Plan: Diagnosis [id number] (Uncomplicated bereavement) [n/a]  F43.21 (Adjustment Disorder, With depressed mood) [n/a]  F41.1 (Generalized anxiety disorder) [n/a]  Symptoms Excessive and/or unrealistic worry that is difficult to control occurring more days than not for at least 6 months about a number of events or activities. (Status: maintained) -- No Description Entered  Strong emotional response of sadness exhibited when losses are discussed. (Status: maintained) -- No Description Entered  Thoughts dominated by loss coupled with poor concentration, tearful spells, and confusion about the future. (Status: maintained) -- No Description Entered  Medication Status na  Safety none  If Suicidal or Homicidal State Action Taken: unspecified  Current Risk: low Medications unspecified Objectives Related Problem: Complete the process of letting go of the lost significant other. Description: Verbalize resolution of feelings of guilt and regret associated with the loss. Target Date: 2023-04-24 Frequency: Daily Modality: individual Progress: 80%  Related Problem: Complete the process of letting go of the lost significant other. Description: Participate in a therapy that addresses issues beyond grief that have arisen as a result of the loss. Target Date: 2023-04-24 Frequency: Daily Modality: individual Progress: 90%  Related Problem: Learn and implement coping skills that result in a reduction of anxiety and worry, and improved daily functioning. Description: Learn and implement problem-solving strategies for realistically addressing worries. Target Date: 2023-04-24 Frequency: Daily Modality: individual Progress: 70%  Related Problem: Learn and implement coping skills that result in a reduction of anxiety and worry, and improved daily  functioning. Description: Learn and implement calming skills to reduce overall anxiety and manage anxiety symptoms. Target Date: 2023-04-24 Frequency: Daily Modality: individual Progress: 50%  Related Problem: Learn and implement coping skills that result in a reduction of anxiety and worry, and improved daily functioning. Description: Describe situations, thoughts, feelings, and actions associated with anxieties and worries, their impact on functioning, and attempts to resolve them. Target Date: 2023-04-24 Frequency: Daily Modality: individual Progress: 70%  Client Response full compliance  Service Location Location, [address] [place], [place] [address]  Service Code cpt [phone]  Lifestyle change (exercise, nutrition)  Self care activities  Provide education, information  Identify/label emotions  Facilitate problem solving  Normalize/Reframe  Validate/empathize  Related past to present  Comments  Adj. Disorder with Depression Meds.: none  Goals: Brenda Hudson lost her husband suddenly several years ago when he collapsed while jogging. She has struggled to adjust to this traumatic loss, which has left a devastating void in her life. She is hoping to use counseling as a way to re-define her life and find both purpose and happiness. She relied on husband for many things and she tends to suffer from a lack of confidence. She also relied on him to co-parent their daughter, who has some special needs and presents her with multiple challenges. She is also seeking additional guidance on parenting this adult daughter. Needs to adjust to having her daughter live with her, likely for the long-term. Focus on treatment evolving to address the complicated and conflictual relationship with daughter Brenda Hudson. Kohen expresses many concerns/fears about this relationship, and is paralyzed by these fears and guilt in her efforts to effectively parent and set appropriate boundaries.  Goal date 6-24.   Patient agreed to a  video session. She is at her home and I am at my home office.   Darnell says that Brenda Hudson had to get braces as a preventative measure. She feels she has "no control over her life at all". I suggested that she meet with Brenda Hudson and the financial planner, asking him to tell them about the status of the finances and propose a plan. [name] needs to insist that Brenda Hudson join her. Challenged to to compel Brenda Hudson to go to the financial meeting and to let her know it would be irresponsible to ignore her financial future and security. She agrees and will work toward initiating plan.                                                                                       Brenda Morel, PhD Time:5:15p-6:00p 45 minutes

## 2023-02-24 ENCOUNTER — Ambulatory Visit: Payer: Medicare Other | Admitting: Psychology

## 2023-02-26 ENCOUNTER — Other Ambulatory Visit: Payer: Medicare Other

## 2023-03-03 ENCOUNTER — Ambulatory Visit (INDEPENDENT_AMBULATORY_CARE_PROVIDER_SITE_OTHER): Payer: Medicare Other | Admitting: Psychology

## 2023-03-03 DIAGNOSIS — F4321 Adjustment disorder with depressed mood: Secondary | ICD-10-CM | POA: Diagnosis not present

## 2023-03-03 NOTE — Progress Notes (Signed)
Brenda Hudson is a 72 y.o. female patient    Date: 03/03/2023  Treatment Plan: Diagnosis V62.82 (Uncomplicated bereavement) [n/a]  F43.21 (Adjustment Disorder, With depressed mood) [n/a]  F41.1 (Generalized anxiety disorder) [n/a]  Symptoms Excessive and/or unrealistic worry that is difficult to control occurring more days than not for at least 6 months about a number of events or activities. (Status: maintained) -- No Description Entered  Strong emotional response of sadness exhibited when losses are discussed. (Status: maintained) -- No Description Entered  Thoughts dominated by loss coupled with poor concentration, tearful spells, and confusion about the future. (Status: maintained) -- No Description Entered  Medication Status na  Safety none  If Suicidal or Homicidal State Action Taken: unspecified  Current Risk: low Medications unspecified Objectives Related Problem: Complete the process of letting go of the lost significant other. Description: Verbalize resolution of feelings of guilt and regret associated with the loss. Target Date: 2023-04-24 Frequency: Daily Modality: individual Progress: 80%  Related Problem: Complete the process of letting go of the lost significant other. Description: Participate in a therapy that addresses issues beyond grief that have arisen as a result of the loss. Target Date: 2023-04-24 Frequency: Daily Modality: individual Progress: 90%  Related Problem: Learn and implement coping skills that result in a reduction of anxiety and worry, and improved daily functioning. Description: Learn and implement problem-solving strategies for realistically addressing worries. Target Date: 2023-04-24 Frequency: Daily Modality: individual Progress: 70%  Related Problem: Learn and implement coping skills that result in a reduction of anxiety and worry, and  improved daily functioning. Description: Learn and implement calming skills to reduce overall anxiety and manage anxiety symptoms. Target Date: 2023-04-24 Frequency: Daily Modality: individual Progress: 50%  Related Problem: Learn and implement coping skills that result in a reduction of anxiety and worry, and improved daily functioning. Description: Describe situations, thoughts, feelings, and actions associated with anxieties and worries, their impact on functioning, and attempts to resolve them. Target Date: 2023-04-24 Frequency: Daily Modality: individual Progress: 70%  Client Response full compliance  Service Location Location, [address] [place], [place] [address]  Service Code cpt [phone]  Lifestyle change (exercise, nutrition)  Self care activities  Provide education, information  Identify/label emotions  Facilitate problem solving  Normalize/Reframe  Validate/empathize  Related past to present  Comments  Adj. Disorder with Depression Meds.: none  Goals: Brenda Hudson lost her husband suddenly several years ago when he collapsed while jogging. She has struggled to adjust to this traumatic loss, which has left a devastating void in her life. She is hoping to use counseling as a way to re-define her life and find both purpose and happiness. She relied on husband for many things and she tends to suffer from a lack of confidence. She also relied on him to co-parent their daughter, who has some special needs and presents her with multiple challenges. She is also seeking additional guidance on parenting this adult daughter. Needs to adjust to having her daughter live with her, likely for the long-term. Focus on treatment evolving to address the complicated and conflictual relationship with daughter Kit. Flornce expresses many concerns/fears about this relationship, and is paralyzed by these fears and guilt in her efforts to effectively  parent and set appropriate boundaries.  Goal date  6-24.  Patient agreed to a video session. She is at her home and I am at my home office.   Brenda Hudson says that the surrogate for Kit was found to be infertile. They will be refunded the money they have already paid. She says the last two days have been very rough at home. She says this has been very hard for Kit. I proposed that this might be an opportunity to revisit whether the pregnancy is a good idea. She met with her financial advisor this week and was told that she needs to do something "drastic" in order to maintain financial security. Brenda Hudson said that  the advisor said that they could/should start with getting rid of the costs of the horses. Kit is on board with making arrangements for their horses to reduce costs. Brenda Hudson did not tell Kit she had an appointment with her financial advisor. Kit wanted to buy a new horse and Brenda Hudson was honest and open about their finances. Kit was enraged with Brenda Hudson, but Brenda Hudson held her ground and states she will not waver. I validated her position and need to be consistent with Kit.                                                                                         [name], PhD Time:5:15p-6:00p 45 minutes

## 2023-03-04 ENCOUNTER — Other Ambulatory Visit: Payer: Self-pay | Admitting: Internal Medicine

## 2023-03-04 DIAGNOSIS — I493 Ventricular premature depolarization: Secondary | ICD-10-CM

## 2023-03-10 ENCOUNTER — Ambulatory Visit (INDEPENDENT_AMBULATORY_CARE_PROVIDER_SITE_OTHER): Payer: Medicare Other | Admitting: Psychology

## 2023-03-10 DIAGNOSIS — F4321 Adjustment disorder with depressed mood: Secondary | ICD-10-CM

## 2023-03-10 NOTE — Progress Notes (Signed)
Brenda Hudson is a 72 y.o. female patient    Date: 03/10/2023  Treatment Plan: Diagnosis V62.82 (Uncomplicated bereavement) [n/a]  F43.21 (Adjustment Disorder, With depressed mood) [n/a]  F41.1 (Generalized anxiety disorder) [n/a]  Symptoms Excessive and/or unrealistic worry that is difficult to control occurring more days than not for at least 6 months about a number of events or activities. (Status: maintained) -- No Description Entered  Strong emotional response of sadness exhibited when losses are discussed. (Status: maintained) -- No Description Entered  Thoughts dominated by loss coupled with poor concentration, tearful spells, and confusion about the future. (Status: maintained) -- No Description Entered  Medication Status na  Safety none  If Suicidal or Homicidal State Action Taken: unspecified  Current Risk: low Medications unspecified Objectives Related Problem: Complete the process of letting go of the lost significant other. Description: Verbalize resolution of feelings of guilt and regret associated with the loss. Target Date: 2023-04-24 Frequency: Daily Modality: individual Progress: 80%  Related Problem: Complete the process of letting go of the lost significant other. Description: Participate in a therapy that addresses issues beyond grief that have arisen as a result of the loss. Target Date: 2023-04-24 Frequency: Daily Modality: individual Progress: 90%  Related Problem: Learn and implement coping skills that result in a reduction of anxiety and worry, and improved daily functioning. Description: Learn and implement problem-solving strategies for realistically addressing worries. Target Date: 2023-04-24 Frequency: Daily Modality: individual Progress: 70%  Related Problem: Learn and implement coping skills that result in a reduction of anxiety and worry, and improved daily  functioning. Description: Learn and implement calming skills to reduce overall anxiety and manage anxiety symptoms. Target Date: 2023-04-24 Frequency: Daily Modality: individual Progress: 50%  Related Problem: Learn and implement coping skills that result in a reduction of anxiety and worry, and improved daily functioning. Description: Describe situations, thoughts, feelings, and actions associated with anxieties and worries, their impact on functioning, and attempts to resolve them. Target Date: 2023-04-24 Frequency: Daily Modality: individual Progress: 70%  Client Response full compliance  Service Location Location, [address] [place], [place] [address]  Service Code cpt [phone]  Lifestyle change (exercise, nutrition)  Self care activities  Provide education, information  Identify/label emotions  Facilitate problem solving  Normalize/Reframe  Validate/empathize  Related past to present  Comments  Adj. Disorder with Depression Meds.: none  Goals: Brenda Hudson lost her husband suddenly several years ago when he collapsed while jogging. She has struggled to adjust to this traumatic loss, which has left a devastating void in her life. She is hoping to use counseling as a way to re-define her life and find both purpose and happiness. She relied on husband for many things and she tends to suffer from a lack of confidence. She also relied on him to co-parent their daughter, who has some special needs and presents her with multiple challenges. She is also seeking additional guidance on parenting this adult daughter. Needs to adjust to having her daughter live with her, likely for the long-term. Focus on treatment evolving to address the complicated and conflictual relationship with daughter Brenda Hudson. Brenda Hudson expresses many concerns/fears about this relationship, and is paralyzed by these fears and guilt in her efforts to effectively parent and set appropriate boundaries.  Goal date 6-24.   Patient agreed  to a video session. She is at her home and I am at my home office.   Brenda Hudson says that the anniversary of Brenda Hudson's death is this weekend and she and Brenda Hudson will go to [employer]. She was joined in session by her brother in law who is visiting. She wanted him to hear what she has been dealing with regarding Brenda Hudson. He is mostly up to speed. Talked about some of the specifics that need to happen to assist Brenda Hudson in talking to Brenda Hudson about difficult topics like finances. We all agreed that she will need support during that discussion. He is willing to come in to help facilitate that discussion. We will all meet tgether again to discuss specifics of how to orchestrate a discussion with Brenda Hudson. This is a good window given that it will be at least 6 months until another surrogate is identified.                                                                                          Brenda Ridgel, PhD Time:4:15p-5:00p 45 minutes

## 2023-03-17 ENCOUNTER — Ambulatory Visit
Admission: RE | Admit: 2023-03-17 | Discharge: 2023-03-17 | Disposition: A | Discharge status: Home / Self Care | Payer: Medicare Other | Source: Ambulatory Visit | Attending: Family Medicine | Admitting: Family Medicine

## 2023-03-17 ENCOUNTER — Ambulatory Visit: Payer: Medicare Other | Admitting: Family

## 2023-03-17 DIAGNOSIS — H5712 Ocular pain, left eye: Secondary | ICD-10-CM

## 2023-03-17 DIAGNOSIS — G4452 New daily persistent headache (NDPH): Secondary | ICD-10-CM

## 2023-03-17 DIAGNOSIS — R519 Headache, unspecified: Secondary | ICD-10-CM | POA: Diagnosis not present

## 2023-03-18 ENCOUNTER — Ambulatory Visit: Payer: Medicare Other | Admitting: Family

## 2023-03-24 ENCOUNTER — Ambulatory Visit (INDEPENDENT_AMBULATORY_CARE_PROVIDER_SITE_OTHER): Payer: Medicare Other | Admitting: Psychology

## 2023-03-24 DIAGNOSIS — F4321 Adjustment disorder with depressed mood: Secondary | ICD-10-CM

## 2023-03-24 NOTE — Progress Notes (Signed)
Brenda Hudson is a 72 y.o. female patient    Date: 03/24/2023  Treatment Plan: Diagnosis V62.82 (Uncomplicated bereavement) [n/a]  F43.21 (Adjustment Disorder, With depressed mood) [n/a]  F41.1 (Generalized anxiety disorder) [n/a]  Symptoms Excessive and/or unrealistic worry that is difficult to control occurring more days than not for at least 6 months about a number of events or activities. (Status: maintained) -- No Description Entered  Strong emotional response of sadness exhibited when losses are discussed. (Status: maintained) -- No Description Entered  Thoughts dominated by loss coupled with poor concentration, tearful spells, and confusion about the future. (Status: maintained) -- No Description Entered  Medication Status na  Safety none  If Suicidal or Homicidal State Action Taken: unspecified  Current Risk: low Medications unspecified Objectives Related Problem: Complete the process of letting go of the lost significant other. Description: Verbalize resolution of feelings of guilt and regret associated with the loss. Target Date: 2023-04-24 Frequency: Daily Modality: individual Progress: 80%  Related Problem: Complete the process of letting go of the lost significant other. Description: Participate in a therapy that addresses issues beyond grief that have arisen as a result of the loss. Target Date: 2023-04-24 Frequency: Daily Modality: individual Progress: 90%  Related Problem: Learn and implement coping skills that result in a reduction of anxiety and worry, and improved daily functioning. Description: Learn and implement problem-solving strategies for realistically addressing worries. Target Date: 2023-04-24 Frequency: Daily Modality: individual Progress: 70%  Related Problem: Learn and implement coping skills that result in a reduction of anxiety and worry, and  improved daily functioning. Description: Learn and implement calming skills to reduce overall anxiety and manage anxiety symptoms. Target Date: 2023-04-24 Frequency: Daily Modality: individual Progress: 50%  Related Problem: Learn and implement coping skills that result in a reduction of anxiety and worry, and improved daily functioning. Description: Describe situations, thoughts, feelings, and actions associated with anxieties and worries, their impact on functioning, and attempts to resolve them. Target Date: 2023-04-24 Frequency: Daily Modality: individual Progress: 70%  Client Response full compliance  Service Location Location, [address] [place], [place] [address]  Service Code cpt [phone]  Lifestyle change (exercise, nutrition)  Self care activities  Provide education, information  Identify/label emotions  Facilitate problem solving  Normalize/Reframe  Validate/empathize  Related past to present  Comments  Adj. Disorder with Depression Meds.: none  Goals: Brenda Hudson lost her husband suddenly several years ago when he collapsed while jogging. She has struggled to adjust to this traumatic loss, which has left a devastating void in her life. She is hoping to use counseling as a way to re-define her life and find both purpose and happiness. She relied on husband for many things and she tends to suffer from a lack of confidence. She also relied on him to co-parent their daughter, who has some special needs and presents her with multiple challenges. She is also seeking additional guidance on parenting this adult daughter. Needs to adjust to having her daughter live with her, likely for the long-term. Focus on treatment evolving to address the complicated and conflictual relationship with daughter Brenda Hudson. Aveleen expresses many concerns/fears about this relationship, and is paralyzed by these fears and guilt in her efforts  to effectively parent and set appropriate boundaries.  Goal date  6-24.  Patient agreed to a video session. She is at her home and I am at my home office.   Brenda Hudson talked about her brother in law and appreciation for his love and support. She says that since Richard left, Brenda Hudson has been communicating with her more and even offered her support in selling the house. They talked about finances for 2 hours without yelling and Brenda Hudson told her about the reality of their financial situation. She told Brenda Hudson about the horses and says Brenda Hudson is working on reducing the horse expense. She is thinking she may not have Richard come down to speak with her and Brenda Hudson as long as they keep having discussions like this. Cautioned Brenda Hudson not to think that all has changed. Richard needs to remain "on call" and kept up to date. She agrees. Also, need to schedule follow-up discussions with Brenda Hudson.                                                                                           [name], PhD Time:5:10p-6:00p 45 minutes

## 2023-04-07 ENCOUNTER — Ambulatory Visit (INDEPENDENT_AMBULATORY_CARE_PROVIDER_SITE_OTHER): Payer: Medicare Other | Admitting: Psychology

## 2023-04-07 DIAGNOSIS — F4321 Adjustment disorder with depressed mood: Secondary | ICD-10-CM

## 2023-04-07 NOTE — Progress Notes (Signed)
Brenda Hudson is a 72 y.o. female patient    Date: 04/07/2023  Treatment Plan: Diagnosis V62.82 (Uncomplicated bereavement) [n/a]  F43.21 (Adjustment Disorder, With depressed mood) [n/a]  F41.1 (Generalized anxiety disorder) [n/a]  Symptoms Excessive and/or unrealistic worry that is difficult to control occurring more days than not for at least 6 months about a number of events or activities. (Status: maintained) -- No Description Entered  Strong emotional response of sadness exhibited when losses are discussed. (Status: maintained) -- No Description Entered  Thoughts dominated by loss coupled with poor concentration, tearful spells, and confusion about the future. (Status: maintained) -- No Description Entered  Medication Status na  Safety none  If Suicidal or Homicidal State Action Taken: unspecified  Current Risk: low Medications unspecified Objectives Related Problem: Complete the process of letting go of the lost significant other. Description: Verbalize resolution of feelings of guilt and regret associated with the loss. Target Date: 2023-10-24 Frequency: Daily Modality: individual Progress: 90%  Related Problem: Complete the process of letting go of the lost significant other. Description: Participate in a therapy that addresses issues beyond grief that have arisen as a result of the loss. Target Date: 2023-10-24 Frequency: Daily Modality: individual Progress: 100%  Related Problem: Learn and implement coping skills that result in a reduction of anxiety and worry, and improved daily functioning. Description: Learn and implement problem-solving strategies for realistically addressing worries. Target Date: 2023-10-24 Frequency: Daily Modality: individual Progress: 70%  Related Problem: Learn and implement coping skills that result in a  reduction of anxiety and worry, and improved daily functioning. Description: Learn and implement calming skills to reduce overall anxiety and manage anxiety symptoms. Target Date: 2023-10-24 Frequency: Daily Modality: individual Progress: 70%  Related Problem: Learn and implement coping skills that result in a reduction of anxiety and worry, and improved daily functioning. Description: Describe situations, thoughts, feelings, and actions associated with anxieties and worries, their impact on functioning, and attempts to resolve them. Target Date: 2023-10-24 Frequency: Daily Modality: individual Progress: 70%  Client Response full compliance  Service Location Location, [address] [place], [place] [address]  Service Code cpt [phone]  Lifestyle change (exercise, nutrition)  Self care activities  Provide education, information  Identify/label emotions  Facilitate problem solving  Normalize/Reframe  Validate/empathize  Related past to present  Comments  Adj. Disorder with Depression Meds.: none  Goals: Brenda Hudson lost her husband suddenly several years ago when he collapsed while jogging. She has struggled to adjust to this traumatic loss, which has left a devastating void in her life. She is hoping to use counseling as a way to re-define her life and find both purpose and happiness. She relied on husband for many things and she tends to suffer from a lack of confidence. She also relied on him to co-parent their daughter, who has some special needs and presents her with multiple challenges. She is also seeking additional guidance on parenting this adult daughter. Needs to adjust to having her daughter live with her, likely for the long-term. Focus on treatment evolving to address the complicated and conflictual relationship with daughter Brenda Hudson. Brenda Hudson expresses many  concerns/fears about this relationship, and is paralyzed by these fears and guilt in her efforts to effectively parent and  set appropriate boundaries.  Goal date 12-24.  Patient agreed to a video session. She is at her home and I am at my home office.   Brenda Hudson says the "good" discussions have continued with Brenda Hudson. She was surprised that Brenda Hudson, at one point, talked to her about what a wonderful mother she is and that she cannot imagine life without her. Brenda Hudson is saying that Brenda Hudson is working hard to be kind. Brenda Hudson still asks her for the "letter of admission" which Brenda Hudson told her she cannot write. Her sister in law and her husband have moved back to [name]) and Brenda Hudson is their only family here. Brenda Hudson has been clear that she cannot be responsible for them. That is, they cannot count on Brenda Hudson to be there for them all the time. Brenda Hudson says that the 10th anniversary of Brenda Hudson's death was on the 2023/04/19 and she and Brenda Hudson went on a hike together. Brenda Hudson has been sharing nice thoughts about Brenda Hudson lately, which is a huge change. Another sign of growth is that Brenda Hudson stated she is willing to cut back her therapy sessions.                                                                                              [name], PhD Time:5:10p-6:00p 45 minutes

## 2023-04-12 DIAGNOSIS — R519 Headache, unspecified: Secondary | ICD-10-CM | POA: Diagnosis not present

## 2023-04-12 DIAGNOSIS — R0982 Postnasal drip: Secondary | ICD-10-CM | POA: Diagnosis not present

## 2023-04-12 DIAGNOSIS — J329 Chronic sinusitis, unspecified: Secondary | ICD-10-CM | POA: Diagnosis not present

## 2023-04-15 DIAGNOSIS — M4316 Spondylolisthesis, lumbar region: Secondary | ICD-10-CM | POA: Diagnosis not present

## 2023-04-19 DIAGNOSIS — R519 Headache, unspecified: Secondary | ICD-10-CM | POA: Diagnosis not present

## 2023-04-21 ENCOUNTER — Ambulatory Visit (INDEPENDENT_AMBULATORY_CARE_PROVIDER_SITE_OTHER): Payer: Medicare Other | Admitting: Psychology

## 2023-04-21 DIAGNOSIS — F4321 Adjustment disorder with depressed mood: Secondary | ICD-10-CM
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
## 2023-04-27 DIAGNOSIS — M545 Low back pain, unspecified: Secondary | ICD-10-CM | POA: Diagnosis not present

## 2023-04-27 DIAGNOSIS — M4316 Spondylolisthesis, lumbar region: Secondary | ICD-10-CM | POA: Diagnosis not present

## 2023-05-05 ENCOUNTER — Ambulatory Visit (INDEPENDENT_AMBULATORY_CARE_PROVIDER_SITE_OTHER): Payer: Medicare Other | Admitting: Psychology

## 2023-05-05 DIAGNOSIS — F4321 Adjustment disorder with depressed mood: Secondary | ICD-10-CM

## 2023-05-05 NOTE — Progress Notes (Signed)
Brenda Hudson is a 72 y.o. female patient    Date: 05/05/2023  Treatment Plan: Diagnosis V62.82 (Uncomplicated bereavement) [n/a]  F43.21 (Adjustment Disorder, With depressed mood) [n/a]  F41.1 (Generalized anxiety disorder) [n/a]  Symptoms Excessive and/or unrealistic worry that is difficult to control occurring more days than not for at least 6 months about a number of events or activities. (Status: maintained) -- No Description Entered  Strong emotional response of sadness exhibited when losses are discussed. (Status: maintained) -- No Description Entered  Thoughts dominated by loss coupled with poor concentration, tearful spells, and confusion about the future. (Status: maintained) -- No Description Entered  Medication Status na  Safety none  If Suicidal or Homicidal State Action Taken: unspecified  Current Risk: low Medications unspecified Objectives Related Problem: Complete the process of letting go of the lost significant other. Description: Verbalize resolution of feelings of guilt and regret associated with the loss. Target Date: 2023-10-24 Frequency: Daily Modality: individual Progress: 90%  Related Problem: Complete the process of letting go of the lost significant other. Description: Participate in a therapy that addresses issues beyond grief that have arisen as a result of the loss. Target Date: 2023-10-24 Frequency: Daily Modality: individual Progress: 100%  Related Problem: Learn and implement coping skills that result in a reduction of anxiety and worry, and improved daily functioning. Description: Learn and implement problem-solving strategies for realistically addressing worries. Target Date: 2023-10-24 Frequency: Daily Modality: individual Progress: 70%  Related  Problem: Learn and implement coping skills that result in a reduction of anxiety and worry, and improved daily functioning. Description: Learn and implement calming skills to reduce overall anxiety and manage anxiety symptoms. Target Date: 2023-10-24 Frequency: Daily Modality: individual Progress: 70%  Related Problem: Learn and implement coping skills that result in a reduction of anxiety and worry, and improved daily functioning. Description: Describe situations, thoughts, feelings, and actions associated with anxieties and worries, their impact on functioning, and attempts to resolve them. Target Date: 2023-10-24 Frequency: Daily Modality: individual Progress: 70%  Client Response full compliance  Service Location Location, [address] [place], [place] [address]  Service Code cpt [phone]  Lifestyle change (exercise, nutrition)  Self care activities  Provide education, information  Identify/label emotions  Facilitate problem solving  Normalize/Reframe  Validate/empathize  Related past to present  Comments  Adj. Disorder with Depression Meds.: none  Goals: Kinzlie lost her husband suddenly several years ago when he collapsed while jogging. She has struggled to adjust to this traumatic loss, which has left a devastating void in her life. She is hoping to use counseling as a way to re-define her life and find both purpose and happiness. She relied on husband for many things and she tends to suffer from a lack of confidence. She also relied on him to co-parent their daughter, who has some special needs and presents her with multiple challenges. She is also seeking additional guidance on parenting this adult daughter. Needs  to adjust to having her daughter live with her, likely for the long-term. Focus on treatment evolving to address the complicated and conflictual relationship with daughter Kit. Walesca expresses many concerns/fears about this relationship, and is paralyzed by  these fears and guilt in her efforts to effectively parent and set appropriate boundaries.  Goal date 12-24.  Patient agreed to a video Public affairs consultant) session and is aware of the limitations of the platform. She is at her home and I am at my home office.   [place]ahoma says that she and Kit have gotten along well, but had a "tif" today about money. Kit, again, shared that she does not want any discussion between them regarding finances. Shamia talked about her brother and sister in law moving back to [place]. Her sister in law Larita Fife is very difficult and tries to rely on [place].  Shahnaz got a number of denials from insurance for Kit's therapy. We talked about passing the responsibility of re filing claims back to Kit. It will be difficult, but Dylyn is will to take this step. Fears it will be a conflict.                                                                                                        [name], PhD Time:5:10p-6:00p 45 minutes

## 2023-05-08 ENCOUNTER — Other Ambulatory Visit: Payer: Self-pay | Admitting: Family Medicine

## 2023-05-08 DIAGNOSIS — I341 Nonrheumatic mitral (valve) prolapse: Secondary | ICD-10-CM

## 2023-05-08 DIAGNOSIS — R002 Palpitations: Secondary | ICD-10-CM

## 2023-06-02 ENCOUNTER — Ambulatory Visit (INDEPENDENT_AMBULATORY_CARE_PROVIDER_SITE_OTHER): Payer: Medicare Other | Admitting: Psychology

## 2023-06-02 DIAGNOSIS — F4321 Adjustment disorder with depressed mood: Secondary | ICD-10-CM | POA: Diagnosis not present
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
## 2023-06-16 ENCOUNTER — Ambulatory Visit: Payer: Medicare Other | Admitting: Psychology

## 2023-07-14 ENCOUNTER — Ambulatory Visit (INDEPENDENT_AMBULATORY_CARE_PROVIDER_SITE_OTHER): Payer: Medicare Other | Admitting: Psychology

## 2023-07-14 DIAGNOSIS — F4321 Adjustment disorder with depressed mood: Secondary | ICD-10-CM | POA: Diagnosis not present
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
## 2023-07-23 ENCOUNTER — Other Ambulatory Visit: Payer: Self-pay | Admitting: Family Medicine

## 2023-07-28 ENCOUNTER — Ambulatory Visit: Payer: Medicare Other | Admitting: Psychology

## 2023-08-10 ENCOUNTER — Encounter: Payer: Self-pay | Admitting: Family

## 2023-08-10 ENCOUNTER — Ambulatory Visit: Payer: Medicare Other | Admitting: Family

## 2023-08-10 VITALS — BP 110/66 | HR 57 | Temp 98.0°F | Ht 63.0 in | Wt 113.5 lb

## 2023-08-10 DIAGNOSIS — L237 Allergic contact dermatitis due to plants, except food: Secondary | ICD-10-CM

## 2023-08-10 MED ORDER — TRIAMCINOLONE ACETONIDE 0.5 % EX OINT
1.0000 | TOPICAL_OINTMENT | Freq: Two times a day (BID) | CUTANEOUS | 0 refills | Status: DC
Start: 2023-08-10 — End: 2023-09-28

## 2023-08-10 MED ORDER — PREDNISONE 10 MG PO TABS
ORAL_TABLET | ORAL | 0 refills | Status: DC
Start: 2023-08-10 — End: 2023-09-28

## 2023-08-10 NOTE — Progress Notes (Signed)
Patient ID: Brenda Hudson, female    DOB: 03-Nov-1951, 72 y.o.   MRN: [id number]  Chief Complaint  Patient presents with   Poison Ivy    Pt c/o Poison oak on bilateral arms and left foot, Present for about a week. Has tried calamine lotion and benadryl which did not help.     HPI:      Poison oak rash:   Pt c/o Poison oak on bilateral arms, left thigh and left foot, Present for about a week. Has tried calamine lotion, OTC cortisone cream and benadryl which did not help. Pt reports having initially on arms and then has slowly spread since then.  Assessment & Plan:  1. Poison oak dermatitis - sending pred taper pack & steroid ointment, advised on use & SE of both. Avoid scratching, gently rub with pad of finger. RTO precautions provided.  - predniSONE (DELTASONE) 10 MG tablet; Take with breakfast:  6 tab day 1, 5 tab day 2-3, 4 tab day 4-5, 3 tab day 5, 2 tab day 6  Dispense: 29 tablet; Refill: 0 - triamcinolone ointment (KENALOG) 0.5 %; Apply 1 Application topically 2 (two) times daily. Apply to rash for itching & swelling.  Dispense: 30 g; Refill: 0  Subjective:    Outpatient Medications Prior to Visit  Medication Sig Dispense Refill   ACETAMINOPHEN PO Take by mouth as needed.     Cholecalciferol (VITAMIN D3) 50 MCG (2000 UT) CAPS Take by mouth.     flecainide (TAMBOCOR) 100 MG tablet TAKE 1 TABLET BY MOUTH TWICE A DAY 180 tablet 2   Ibuprofen 200 MG CAPS Take 400 mg by mouth in the morning and at bedtime.      loratadine (CLARITIN) 10 MG tablet Take 10 mg by mouth daily as needed for allergies.     MELATONIN PO Take by mouth.     metoprolol succinate (TOPROL-XL) 25 MG 24 hr tablet TAKE 1 TABLET (25 MG TOTAL) BY MOUTH DAILY. 90 tablet 0   sertraline (ZOLOFT) 25 MG tablet TAKE 1 TABLET (25 MG TOTAL) BY MOUTH DAILY. 90 tablet 2   No facility-administered medications prior to visit.   Past Medical History:  Diagnosis Date   Allergy    Anxiety    Arthritis    Diverticulosis     Frequent PVCs    and PAC per pt/has had along time   GERD (gastroesophageal reflux disease)    Heart murmur    Hx of echocardiogram    a. Echo 12/13:  EF 55-60%, mild MR, normal diast fxn, no MVP   IBS (irritable bowel syndrome)    MVP (mitral valve prolapse)    Osteoporosis    Palpitations    Polymyalgia rheumatica (HCC)    Rectal pain    Spinal stenosis    Past Surgical History:  Procedure Laterality Date   COLONOSCOPY     DILATION AND CURETTAGE OF UTERUS     TONSILLECTOMY     TUBAL LIGATION     Allergies  Allergen Reactions   Cefaclor     REACTION: rash   Penicillins     REACTION: rash   Sulfonamide Derivatives     Severe Diffuse swelling including possibly tongue      Objective:    Physical Exam Vitals and nursing note reviewed.  Constitutional:      Appearance: Normal appearance.  Cardiovascular:     Rate and Rhythm: Normal rate and regular rhythm.  Pulmonary:     Effort: Pulmonary  effort is normal.     Breath sounds: Normal breath sounds.  Musculoskeletal:        General: Normal range of motion.  Skin:    General: Skin is warm and dry.     Findings: Rash (dark erythema, from end of hand almost to elbow, diffuse, confluent with edema on left anterior arm; lighter erythema patch on right arm, several small areas on feet and upper left leg) present.       Neurological:     Mental Status: She is alert.  Psychiatric:        Mood and Affect: Mood normal.        Behavior: Behavior normal.    BP 110/66 (BP Location: Left Arm, Patient Position: Sitting, Cuff Size: Normal)   Pulse (!) 57   Temp 98 F (36.7 C) (Temporal)   Ht 5\' 3"  (1.6 m)   Wt 113 lb 8 oz (51.5 kg)   SpO2 98%   BMI 20.11 kg/m  Wt Readings from Last 3 Encounters:  08/10/23 113 lb 8 oz (51.5 kg)  01/28/23 112 lb 9.6 oz (51.1 kg)  12/10/22 113 lb 3.2 oz (51.3 kg)      Dulce Sellar, NP

## 2023-08-11 ENCOUNTER — Ambulatory Visit: Payer: Medicare Other | Admitting: Psychology

## 2023-08-11 DIAGNOSIS — F4321 Adjustment disorder with depressed mood: Secondary | ICD-10-CM | POA: Diagnosis not present
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
## 2023-08-16 ENCOUNTER — Ambulatory Visit (INDEPENDENT_AMBULATORY_CARE_PROVIDER_SITE_OTHER): Payer: Medicare Other | Admitting: Neurology

## 2023-08-16 ENCOUNTER — Encounter: Payer: Self-pay | Admitting: Neurology

## 2023-08-16 VITALS — BP 108/62 | HR 61 | Ht 63.0 in | Wt 115.0 lb

## 2023-08-16 DIAGNOSIS — R5383 Other fatigue: Secondary | ICD-10-CM | POA: Diagnosis not present

## 2023-08-16 DIAGNOSIS — G509 Disorder of trigeminal nerve, unspecified: Secondary | ICD-10-CM

## 2023-08-16 DIAGNOSIS — R413 Other amnesia: Secondary | ICD-10-CM | POA: Diagnosis not present

## 2023-08-16 DIAGNOSIS — E538 Deficiency of other specified B group vitamins: Secondary | ICD-10-CM | POA: Diagnosis not present

## 2023-08-16 DIAGNOSIS — G441 Vascular headache, not elsewhere classified: Secondary | ICD-10-CM

## 2023-08-16 DIAGNOSIS — E519 Thiamine deficiency, unspecified: Secondary | ICD-10-CM

## 2023-08-16 DIAGNOSIS — S15009A Unspecified injury of unspecified carotid artery, initial encounter: Secondary | ICD-10-CM

## 2023-08-16 MED ORDER — GABAPENTIN 100 MG PO CAPS: 100.0000 mg | ORAL_CAPSULE | Freq: Three times a day (TID) | ORAL | 6 refills | Status: DC

## 2023-08-16 NOTE — Patient Instructions (Addendum)
Branch of the trigeminal nerve below: Suprorbital and supratrochlear neralgia Ultrasound of the carotid arteries Less likely vascular compression of the trigeminal nerve would cause more widespread pain such as in trigeminal neuralgia, if it gets worse we can consider MRI face/trigeminal nerve looks more closely at the trigeminal nerve Options treatment: medication to decrease sensitivity of the nerve(migraine medications or specifically gabapentin or topiramate or similar) Botox or nerve blocks(lidocaine or longer acting) Formal neurocognitive testing Greg taylor cardiologist Gabapentin 100mg  3x a day a needed        Trigeminal Neuralgia    Trigeminal neuralgia is a nerve disorder that causes severe pain on one side of the face. The pain may last from a few seconds to several minutes, but it can happen hundreds of times a day. The pain is usually only on one side of the face. Symptoms may occur for days, weeks, or months and then go away for months or years. The pain may return and be worse than before. What are the causes? This condition may be caused by: Damage or pressure to a nerve in the head that is called the trigeminal nerve. An attack can be triggered by: Talking or chewing. Putting on makeup. Washing, shaving, or touching your face. Brushing your teeth. Blasts of hot or cold air. Primary demyelinating disorders, such as multiple sclerosis. Tumors. What increases the risk? You are more likely to develop this condition if: You are 22-65 years old. You are female. What are the signs or symptoms? The main symptom of this condition is severe pain in the jaw, lips, eyes, nose, scalp, forehead, and face. How is this diagnosed? This condition is diagnosed with a physical exam. A CT scan or an MRI may be done to rule out other conditions that can cause facial pain. How is this treated? This condition may be treated with: Measures to avoid the things that trigger your  symptoms. Prescription medicines such as anticonvulsants. Procedures such as ablation, thermal, or radiation therapy. Cognitive or behavioral therapy. Complementary therapies such as: Gentle, regular exercise or yoga. Meditation. Aromatherapy. Acupuncture. Surgery. This may be done in severe cases if other medical treatment does not provide relief. It may take up to one month for treatment to start relieving the pain. Follow these instructions at home: Managing pain  Learn as much as you can about how to manage your pain. Ask your health care provider if a pain specialist would be helpful. Consider talking with a mental health care provider about how to cope with the pain. Consider joining a pain support group. General instructions Take over-the-counter and prescription medicines only as told by your health care provider. Avoid the things that trigger your symptoms. It may help to: Chew on the unaffected side of your mouth. Avoid touching your face. Avoid blasts of hot or cold air. Keep all follow-up visits. Where to find more information Facial Pain Association: facepain.org Contact a health care provider if: Your medicine is not helping your symptoms. You have side effects from the medicine used for treatment. You develop new, unexplained symptoms, such as: Double vision. Facial weakness or numbness. Changes in hearing or balance. You feel depressed. Get help right away if: Your pain is severe and is not getting better. You develop suicidal thoughts. If you ever feel like you may hurt yourself or others, or have thoughts about taking your own life, get help right away. Go to your nearest emergency department or: Call your local emergency services (911 in the [place] Call  a suicide crisis helpline, such as the National Suicide Prevention Lifeline at (320)671-0317 or 988 in the [place] This is open 24 hours a day in the [place] Text the Crisis Text Line at 531-622-7765 (in the  [place] Summary Trigeminal neuralgia is a nerve disorder that causes severe pain on one side of the face. The pain may last from a few seconds to several minutes. This condition is caused by damage or pressure to a nerve in the head that is called the trigeminal nerve. Treatment may include avoiding the things that trigger your symptoms, taking medicines, or having procedures or surgery. It may take up to one month for treatment to start relieving the pain. Keep all follow-up visits. This information is not intended to replace advice given to you by your health care provider. Make sure you discuss any questions you have with your health care provider. Document Revised: 05/28/2021 Document Reviewed: 04/27/2021 Elsevier Patient Education  2024 [employer]  Gabapentin Capsules or Tablets What is this medication? GABAPENTIN (GA ba pen tin) treats nerve pain. It may also be used to prevent and control seizures in people with epilepsy. It works by calming overactive nerves in your body. This medicine may be used for other purposes; ask your health care provider or pharmacist if you have questions. COMMON BRAND NAME(S): Active-PAC with Gabapentin, Ascencion Dike, Gralise, Neurontin What should I tell my care team before I take this medication? They need to know if you have any of these conditions: Kidney disease Lung or breathing disease Substance use disorder Suicidal thoughts, plans, or attempt by you or a family member An unusual or allergic reaction to gabapentin, other medications, foods, dyes, or preservatives Pregnant or trying to get pregnant Breastfeeding How should I use this medication? Take this medication by mouth with a glass of water. Follow the directions on the prescription label. You can take it with or without food. If it upsets your stomach, take it with food. Take your medication at regular intervals. Do not take it more often than directed. Do not stop taking except on your care  team's advice. If you are directed to break the 600 or 800 mg tablets in half as part of your dose, the extra half tablet should be used for the next dose. If you have not used the extra half tablet within 28 days, it should be thrown away. A special MedGuide will be given to you by the pharmacist with each prescription and refill. Be sure to read this information carefully each time. Talk to your care team about the use of this medication in children. While this medication may be prescribed for children as young as 3 years for selected conditions, precautions do apply. Overdosage: If you think you have taken too much of this medicine contact a poison control center or emergency room at once. NOTE: This medicine is only for you. Do not share this medicine with others. What if I miss a dose? If you miss a dose, take it as soon as you can. If it is almost time for your next dose, take only that dose. Do not take double or extra doses. What may interact with this medication? Alcohol Antihistamines for allergy, cough, and cold Certain medications for anxiety or sleep Certain medications for depression like amitriptyline, fluoxetine, sertraline Certain medications for seizures like phenobarbital, primidone Certain medications for stomach problems General anesthetics like halothane, isoflurane, methoxyflurane, propofol Local anesthetics like lidocaine, pramoxine, tetracaine Medications that relax muscles for surgery Opioid medications for pain  Phenothiazines like chlorpromazine, mesoridazine, prochlorperazine, thioridazine This list may not describe all possible interactions. Give your health care provider a list of all the medicines, herbs, non-prescription drugs, or dietary supplements you use. Also tell them if you smoke, drink alcohol, or use illegal drugs. Some items may interact with your medicine. What should I watch for while using this medication? Visit your care team for regular checks on  your progress. You may want to keep a record at home of how you feel your condition is responding to treatment. You may want to share this information with your care team at each visit. You should contact your care team if your seizures get worse or if you have any new types of seizures. Do not stop taking this medication or any of your seizure medications unless instructed by your care team. Stopping your medication suddenly can increase your seizures or their severity. This medication may cause serious skin reactions. They can happen weeks to months after starting the medication. Contact your care team right away if you notice fevers or flu-like symptoms with a rash. The rash may be red or purple and then turn into blisters or peeling of the skin. Or, you might notice a red rash with swelling of the face, lips or lymph nodes in your neck or under your arms. Wear a medical identification bracelet or chain if you are taking this medication for seizures. Carry a card that lists all your medications. This medication may affect your coordination, reaction time, or judgment. Do not drive or operate machinery until you know how this medication affects you. Sit up or stand slowly to reduce the risk of dizzy or fainting spells. Drinking alcohol with this medication can increase the risk of these side effects. Your mouth may get dry. Chewing sugarless gum or sucking hard candy, and drinking plenty of water may help. Watch for new or worsening thoughts of suicide or depression. This includes sudden changes in mood, behaviors, or thoughts. These changes can happen at any time but are more common in the beginning of treatment or after a change in dose. Call your care team right away if you experience these thoughts or worsening depression. If you become pregnant while using this medication, you may enroll in the [place] [employer] Antiepileptic Drug Pregnancy Registry by calling 7475900319. This registry collects  information about the safety of antiepileptic medication use during pregnancy. What side effects may I notice from receiving this medication? Side effects that you should report to your care team as soon as possible: Allergic reactions or angioedema--skin rash, itching, hives, swelling of the face, eyes, lips, tongue, arms, or legs, trouble swallowing or breathing Rash, fever, and swollen lymph nodes Thoughts of suicide or self harm, worsening mood, feelings of depression Trouble breathing Unusual changes in mood or behavior in children after use such as difficulty concentrating, hostility, or restlessness Side effects that usually do not require medical attention (report to your care team if they continue or are bothersome): Dizziness Drowsiness Nausea Swelling of ankles, feet, or hands Vomiting This list may not describe all possible side effects. Call your doctor for medical advice about side effects. You may report side effects to FDA at 1-800-FDA-1088. Where should I keep my medication? Keep out of reach of children and pets. Store at room temperature between 15 and 30 degrees C (59 and 86 degrees F). Get rid of any unused medication after the expiration date. This medication may cause accidental overdose and death if taken by other  adults, children, or pets. To get rid of medications that are no longer needed or have expired: Take the medication to a medication take-back program. Check with your pharmacy or law enforcement to find a location. If you cannot return the medication, check the label or package insert to see if the medication should be thrown out in the garbage or flushed down the toilet. If you are not sure, ask your care team. If it is safe to put it in the trash, empty the medication out of the container. Mix the medication with cat litter, dirt, coffee grounds, or other unwanted substance. Seal the mixture in a bag or container. Put it in the trash. NOTE: This sheet is a  summary. It may not cover all possible information. If you have questions about this medicine, talk to your doctor, pharmacist, or health care provider.  2024 Elsevier/Gold Standard (2022-08-17 00:00:00)

## 2023-08-16 NOTE — Progress Notes (Signed)
[facility]    Provider:  Dr Lucia Gaskins Requesting Provider: Aquilla Hacker, PA-C Primary Care Provider:  Shelva Majestic, MD  CC:  facial pain, memory loss  HPI:  Brenda Hudson is a 72 y.o. female here as requested by Aquilla Hacker, PA-C for headaches.  She has a past medical history of recurrent sinusitis (patient states she has chronic sinusitis times adult life on and off), recurrent headaches, postnasal drip, mitral valve prolapse, premature ventricular contraction, past surgical history tubal ligation, I reviewed Aquilla Hacker PA's notes: Patient presented to her in May of this year for headache and postnasal drip, she reported many years of sinus pressure and headache but her symptoms markedly worsened in the last year, states she has been treated with 4 rounds of antibiotics in the last year symptoms include headache with purulent nasal drainage.  Antibiotic resolves the nasal discharge but the headaches remain.  Headaches almost on a daily basis she experiences pain at the left brow that radiates behind the left eye to the cheek area.  It may last several hours.  Aleve and Tylenol helped alleviate the pain nothing specifically seems to trigger this.  She wears eyeglasses is having her eyes checked soon denied any acute vision changes floaters nausea or vomiting ear pain TMJ and generally breathes well through the nose.  MRI of the brain May 2024 demonstrated a tiny mucous retention cyst in the maxillary sinuses and trace mucosal thickening of the ethmoid cells.  Sphenoid and frontal sinuses were unremarkable impression: Unremarkable MRI of the brain and orbits.  Flexible laryngoscopy procedure note for postnasal drip was overall negative/normal "unremarkable exam".  They discussed saline spray in the nose and they offered a referral for formal allergy testing and which patient was going to think about and they recommended dedicated sinus CT imaging and if negative  recommendation to see a neurologist.  She has had multiple sinus infections. She was evaluated thoroughly for her sinuses and headache but everything looked good including the MRI of the brain and CT Maxullofacial. The pain is n her left eye, sharp, starts at the suprorbital/supratrochlear area and goes through the eye and stops under the cheek bone from the top of the eye. The pain is dull and the sharp pain is brief. It will take her breth away like a bolt of lightning. Then a dull pain around the eye. Can last a few hours. Happens 3-4x a week and stress makes it worse. No tearing of the eye or the nose or autonomic symptoms associated. Sometimes is pulsating/pounding/throbbing. Started 6 months ago. Has always had sinus headaches and her father had terrible headaches/migraines that put him to bed. She has 2-3 sinus infections a year but would have headaches at other times. No light or sound sensitivity. She has a disabled daughter and her husband passed away and she is more forgetful. She has a Camera operator. She stays active, she has horses, but she is very worried. It frightens her daughter. She is not good with auditory processing and she is thinking ahead and she may not be remembering and has focus issues. No other focal neurologic deficits, associated symptoms, inciting events or modifiable factors.   Reviewed notes, labs and imaging from outside physicians, which showed:  MRI of the brain Mar 17, 2023 for headache: Significant for tiny mucous percent and a cyst in the maxillary sinuses and trace mucosal thickening of the ethmoid cells.  Sphenoid and frontal sinuses are unremarkable.  Impression: "Unremarkable MRI  of the brain and orbits". Reviewed images with patient as well.  CT Maxillofacial: 1. Paranasal sinuses are stable since 2020 and largely normal. Patent sinus drainage pathways. Small chronic retention cysts. 2. Symmetric nasal cavity mucosal thickening raising the possibility of  rhinitis.   Electronically Signed   By: Odessa Fleming M.D.   On: 05/02/2023 07:02 Narrative  CLINICAL DATA:  72 year old female with recurrent headache.  EXAM: CT MAXILLOFACIAL WITHOUT CONTRAST  TECHNIQUE: MiniCAT CT images of the paranasal sinuses were obtained using the standard protocol without intravenous contrast.  RADIATION DOSE REDUCTION: This exam was performed according to the departmental dose-optimization program which includes automated exposure control, adjustment of the mA and/or kV according to patient size and/or use of iterative reconstruction technique.  COMPARISON:  Paranasal sinus CT 06/20/2019.  FINDINGS: Paranasal sinuses:  Frontal: Stable and well aerated. Minimal mucosal thickening or small retention cyst right frontal sinus series 1, image 32.  Ethmoid: Stable and clear.  Maxillary: Hyperplastic. Stable and well aerated. Small chronic retention cysts, such as on series 1, image 73.  Sphenoid: Stable and clear.  Right ostiomeatal unit: Patent, coronal image 69.  Left ostiomeatal unit: More tortuous but patent, including on coronal image 64.  Nasal passages: Nasal septum is intact , remains fairly midline. Symmetric appearing nasal cavity mucosal thickening, especially the inferior turbinates. But otherwise negative. Olfactory recesses are clear. No significant retained secretions.  Anatomy: As detailed in 2020. Also Haller cell anatomy on the right (coronal image 71). And bilateral middle concha bullosa (same image).  Other: Visible tympanic cavities, left mastoid air cells appear clear. Nasopharynx contour is stable and within normal limits. Trace retained secretions on the left similar to the 2020 CT.  No acute maxillary dental finding is evident. Normal skull base bone mineralization.     Latest Ref Rng & Units 01/28/2023    4:07 PM 08/26/2021   11:30 AM 03/11/2020   12:17 PM  CBC  WBC 3.8 - 10.8 Thousand/uL 6.1  7.1  6.9    Hemoglobin 11.7 - 15.5 g/dL 51.7  61.6  07.3   Hematocrit 35.0 - 45.0 % 35.8  40.3  40.5   Platelets 140 - 400 Thousand/uL 279  241.0  249.0       Latest Ref Rng & Units 01/28/2023    4:07 PM 08/26/2021   11:30 AM 03/11/2020   12:17 PM  CMP  Glucose 65 - 99 mg/dL 710  94  91   BUN 7 - 25 mg/dL 23  14  15    Creatinine 0.50 - 1.05 mg/dL 6.26  9.48  5.46   Sodium 135 - 146 mmol/L 141  141  139   Potassium 3.5 - 5.3 mmol/L 4.5  4.4  4.9   Chloride 98 - 110 mmol/L 104  102  100   CO2 20 - 32 mmol/L 27  31  34   Calcium 8.6 - 10.4 mg/dL 9.1  9.7  9.9   Total Protein 6.1 - 8.1 g/dL 6.5  7.1  6.7   Total Bilirubin 0.2 - 1.2 mg/dL 0.3  0.5  0.4   Alkaline Phos 39 - 117 U/L  25  29   AST 10 - 35 U/L 22  24  20    ALT 6 - 29 U/L 18  12  10      Review of Systems: Patient complains of symptoms per HPI as well as the following symptoms none. Pertinent negatives and positives per HPI. All others negative.   Social  History   Socioeconomic History   Marital status: Widowed    Spouse name: Not on file   Number of children: 2   Years of education: Not on file   Highest education level: Not on file  Occupational History   Occupation: Teacher    Comment: Special Education   Tobacco Use   Smoking status: Never   Smokeless tobacco: Never  Vaping Use   Vaping status: Never Used  Substance and Sexual Activity   Alcohol use: No   Drug use: No   Sexual activity: Not on file  Other Topics Concern   Not on file  Social History Narrative   Widowed in Sep 07, 2013 (husband sudden death MI despite being in great shape). 2 daughters. Oldest daughter pregnant in 09/2014-Phd student at [employer] in early christianity. Younger daughter Maralyn Sago grad student [employer] [place] in philosophy.       [employer]. Both she and her husband. Husband went to [employer] for lawschool. 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       Hobbies: part time job working with [employer] as  Production designer, theatre/television/film (kids sang there) grades 3-12, regular walking, reading, dogs (2) 6 horses       Update 08/16/2023   Caffeine: none   Social Determinants of Health   Financial Resource Strain: Low Risk  (09/09/2022)   Overall Financial Resource Strain (CARDIA)    Difficulty of Paying Living Expenses: Not hard at all  Food Insecurity: Low Risk  (04/12/2023)   [facility]   Hunger Vital Sign    Worried About Running Out of Food in the Last Year: Never true    Ran Out of Food in the Last Year: Never true  Transportation Needs: No Transportation Needs (04/12/2023)   [facility]   Transportation    In the past 12 months, has lack of reliable transportation kept you from medical appointments, meetings, work or from getting things needed for daily living? : No  Physical Activity: Sufficiently Active (09/09/2022)   Exercise Vital Sign    Days of Exercise per Week: 7 days    Minutes of Exercise per Session: 40 min  Stress: Stress Concern Present (09/09/2022)   [employer] of Occupational Health - Occupational Stress Questionnaire    Feeling of Stress : To some extent  Social Connections: Moderately Isolated (09/09/2022)   Social Connection and Isolation Panel [NHANES]    Frequency of Communication with Friends and Family: More than three times a week    Frequency of Social Gatherings with Friends and Family: More than three times a week    Attends Religious Services: More than 4 times per year    Active Member of [employer] or Organizations: No    Attends Banker Meetings: Never    Marital Status: Widowed  Intimate Partner Violence: Not At Risk (09/09/2022)   Humiliation, Afraid, Rape, and Kick questionnaire    Fear of Current or Ex-Partner: No    Emotionally Abused: No    Physically Abused: No    Sexually Abused: No    Family History  Problem Relation Age of Onset   Sleep apnea Mother    Arthritis Mother     Migraines Father    Diabetes Father        in 83s   Kidney disease Father        DM related, renal failure   Heart disease Father  CVA young age, Smoker, overweight   Colon polyps Father    Esophageal cancer Neg Hx    Rectal cancer Neg Hx    Stomach cancer Neg Hx    Colon cancer Neg Hx     Past Medical History:  Diagnosis Date   Allergy    Anxiety    Arthritis    Diverticulosis    Frequent PVCs    and PAC per pt/has had along time   GERD (gastroesophageal reflux disease)    Heart murmur    Hx of echocardiogram    a. Echo 12/13:  EF 55-60%, mild MR, normal diast fxn, no MVP   IBS (irritable bowel syndrome)    MVP (mitral valve prolapse)    Osteoporosis    Palpitations    Polymyalgia rheumatica (HCC)    Rectal pain    Spinal stenosis     Patient Active Problem List   Diagnosis Date Noted   GAD (generalized anxiety disorder) 06/08/2022   Atrial tachycardia (HCC) 06/08/2022   Generalized osteoarthritis of multiple sites 09/30/2021   Migraine 03/13/2020   Osteopenia 01/02/2020   Headache, unspecified 08/21/2019   Spinal stenosis of lumbar region with neurogenic claudication 08/21/2019   Cervical spinal stenosis 08/21/2019   PVC's (premature ventricular contractions) 04/04/2018   Low back pain 09/30/2014   IBS (irritable bowel syndrome) 09/30/2014   Mitral valve prolapse 09/30/2014   Palpitations 09/10/2014   PAC (premature atrial contraction) 09/10/2014   History of polymyalgia rheumatica 03/21/2008    Past Surgical History:  Procedure Laterality Date   COLONOSCOPY     DILATION AND CURETTAGE OF UTERUS     TONSILLECTOMY     TUBAL LIGATION      Current Outpatient Medications  Medication Sig Dispense Refill   ACETAMINOPHEN PO Take by mouth as needed.     Cholecalciferol (VITAMIN D3) 50 MCG (2000 UT) CAPS Take by mouth.     flecainide (TAMBOCOR) 100 MG tablet TAKE 1 TABLET BY MOUTH TWICE A DAY 180 tablet 2   gabapentin (NEURONTIN) 100 MG capsule Take 1  capsule (100 mg total) by mouth 3 (three) times daily. 90 capsule 6   Ibuprofen 200 MG CAPS Take 400 mg by mouth in the morning and at bedtime.      loratadine (CLARITIN) 10 MG tablet Take 10 mg by mouth daily as needed for allergies.     MELATONIN PO Take by mouth.     metoprolol succinate (TOPROL-XL) 25 MG 24 hr tablet TAKE 1 TABLET (25 MG TOTAL) BY MOUTH DAILY. 90 tablet 0   predniSONE (DELTASONE) 10 MG tablet Take with breakfast:  6 tab day 1, 5 tab day 2-3, 4 tab day 4-5, 3 tab day 5, 2 tab day 6 29 tablet 0   sertraline (ZOLOFT) 25 MG tablet TAKE 1 TABLET (25 MG TOTAL) BY MOUTH DAILY. 90 tablet 2   triamcinolone ointment (KENALOG) 0.5 % Apply 1 Application topically 2 (two) times daily. Apply to rash for itching & swelling. 30 g 0   No current facility-administered medications for this visit.    Allergies as of 08/16/2023 - Review Complete 08/16/2023  Allergen Reaction Noted   Cefaclor     Penicillins     Sulfonamide derivatives      Vitals: BP 108/62 (BP Location: Right Arm, Patient Position: Sitting)   Pulse 61   Ht 5\' 3"  (1.6 m)   Wt 115 lb (52.2 kg)   BMI 20.37 kg/m  Last Weight:  Wt Readings from  Last 1 Encounters:  08/16/23 115 lb (52.2 kg)   Last Height:   Ht Readings from Last 1 Encounters:  08/16/23 5\' 3"  (1.6 m)     Physical exam: Exam: Gen: NAD, conversant, well nourised, well groomed                     CV: RRR, no MRG. No Carotid Bruits. No peripheral edema, warm, nontender Eyes: Conjunctivae clear without exudates or hemorrhage  Neuro: Detailed Neurologic Exam  Speech:    Speech is normal; fluent and spontaneous with normal comprehension.  Cognition:    The patient is oriented to person, place, and time;     recent and remote memory intact;     language fluent;     normal attention, concentration,     fund of knowledge Cranial Nerves:    The pupils are equal, round, and reactive to light. Attempted fundoscopic exam could not visualize due to  small pupils. Visual fields are full to finger confrontation. Extraocular movements are intact. Trigeminal sensation is intact and the muscles of mastication are normal. The face is symmetric. The palate elevates in the midline. Hearing intact. Voice is normal. Shoulder shrug is normal. The tongue has normal motion without fasciculations.   Coordination: nml  Gait: nml  Motor Observation:    No asymmetry, no atrophy, and no involuntary movements noted. Tone:    Normal muscle tone.    Posture:    Posture is normal. normal erect    Strength:    Strength is V/V in the upper and lower limbs.      Sensation: intact to LT     Reflex Exam:  DTR's:    Deep tendon reflexes in the upper and lower extremities are normal bilaterally.   Toes:    The toes are downgoing bilaterally.   Clonus:    Clonus is absent.    Assessment/Plan:  Patient with pain in the distribution of the supratrochlear and supraorbital branches of the trigeminal nerve as well as concerns about her cognition  Neurocognitive testing jenna renfroe Family with mother with alzheimers  Branch of the trigeminal nerve see below: Suprorbital and supratrochlear neuralgia Ultrasound of the carotid arteries as carotid disease can cause this type of painful headache Less likely vascular compression of the trigeminal nerve which would cause more widespread pain such as in trigeminal neuralgia, if it gets worse we can consider MRI face/trigeminal nerve to look more closely at the trigeminal nerve Options treatment: medication to decrease sensitivity of the nerve(migraine medications or specifically gabapentin or topiramate or similar) Botox or nerve blocks(lidocaine or longer acting) Formal neurocognitive testing: we discussed her concerns for dementia and FHx of alzheimer's in mother, today we do not have the time to fully address as we can only do so uch on one appointment but we can get the workup started with formal  neurocognitive testing discussed other testing and also the new monoclonal abs like lecanamab Greg taylor cardiologist Gabapentin 100mg  3x a day a needed        Orders Placed This Encounter  Procedures   B12 and Folate Panel   Methylmalonic acid, serum   TSH Rfx on Abnormal to Free T4   Vitamin B1   Ambulatory referral to Neuropsychology   VAS US CAROTID   Meds ordered this encounter  Medications   gabapentin (NEURONTIN) 100 MG capsule    Sig: Take 1 capsule (100 mg total) by mouth 3 (three) times daily.  Dispense:  90 capsule    Refill:  6   I spent over 60 minutes of face-to-face and non-face-to-face time with patient on the  1. Trigeminal nerve disorder   2. Memory loss   3. screen for B12 deficiency   4. screen for Vitamin B1 deficiency   5. Other fatigue   6. Other vascular headache   7. Injury of carotid artery, unspecified laterality, initial encounter    diagnosis.  This included previsit chart review, lab review, study review, order entry, electronic health record documentation, patient education on the different diagnostic and therapeutic options, counseling and coordination of care, risks and benefits of management, compliance, or risk factor reduction    Orders Placed This Encounter  Procedures   B12 and Folate Panel   Methylmalonic acid, serum   TSH Rfx on Abnormal to Free T4   Vitamin B1   Ambulatory referral to Neuropsychology   VAS US CAROTID   Meds ordered this encounter  Medications   gabapentin (NEURONTIN) 100 MG capsule    Sig: Take 1 capsule (100 mg total) by mouth 3 (three) times daily.    Dispense:  90 capsule    Refill:  6    Cc: Aquilla Hacker, PA-C,  Durene Cal Aldine Contes, MD  Naomie Dean, MD  [facility] [address] [place], [place] [address]  Phone [phone] Fax [phone]

## 2023-08-17 LAB — TSH RFX ON ABNORMAL TO FREE T4: TSH: 0.488 u[IU]/mL (ref 0.450–4.500)

## 2023-08-19 LAB — VITAMIN B1: Thiamine: 171.6 nmol/L (ref 66.5–200.0)

## 2023-08-19 LAB — B12 AND FOLATE PANEL
Folate: >20 ng/mL (ref >3.0)
Vitamin B-12: 1015 pg/mL (ref 232–1245)

## 2023-08-19 LAB — METHYLMALONIC ACID, SERUM: Methylmalonic Acid: 266 nmol/L (ref 0–378)

## 2023-08-22 ENCOUNTER — Telehealth: Payer: Self-pay | Admitting: Neurology

## 2023-08-22 NOTE — Telephone Encounter (Signed)
Referral for neuropsychology fax to [facility]. Phone: [phone], Fax: [phone]. 

## 2023-08-25 ENCOUNTER — Ambulatory Visit: Payer: Medicare Other | Admitting: Psychology

## 2023-08-25 DIAGNOSIS — F4321 Adjustment disorder with depressed mood: Secondary | ICD-10-CM

## 2023-08-25 NOTE — Progress Notes (Signed)
Brenda Hudson is a 72 y.o. female patient    Date: 08/25/2023  Treatment Plan: Diagnosis V62.82 (Uncomplicated bereavement) [n/a]  F43.21 (Adjustment Disorder, With depressed mood) [n/a]  F41.1 (Generalized anxiety disorder) [n/a]  Symptoms Excessive and/or unrealistic worry that is difficult to control occurring more days than not for at least 6 months about a number of events or activities. (Status: maintained) -- No Description Entered  Strong emotional response of sadness exhibited when losses are discussed. (Status: maintained) -- No Description Entered  Thoughts dominated by loss coupled with poor concentration, tearful spells, and confusion about the future. (Status: maintained) -- No Description Entered  Medication Status na  Safety none  If Suicidal or Homicidal State Action Taken: unspecified  Current Risk: low Medications unspecified Objectives Related Problem: Complete the process of letting go of the lost significant other. Description: Verbalize resolution of feelings of guilt and regret associated with the loss. Target Date: 2023-10-24 Frequency: Daily Modality: individual Progress: 90%  Related Problem: Complete the process of letting go of the lost significant other. Description: Participate in a therapy that addresses issues beyond grief that have arisen as a result of the loss. Target Date: 2023-10-24 Frequency: Daily Modality: individual Progress: 100%  Related Problem: Learn and implement coping skills that result in a reduction of anxiety and worry, and improved daily functioning. Description: Learn and implement problem-solving strategies for  realistically addressing worries. Target Date: 2023-10-24 Frequency: Daily Modality: individual Progress: 70%  Related Problem: Learn and implement coping skills that result in a reduction of anxiety and worry, and improved daily functioning. Description: Learn and implement calming skills to reduce overall anxiety and manage anxiety symptoms. Target Date: 2023-10-24 Frequency: Daily Modality: individual Progress: 70%  Related Problem: Learn and implement coping skills that result in a reduction of anxiety and worry, and improved daily functioning. Description: Describe situations, thoughts, feelings, and actions associated with anxieties and worries, their impact on functioning, and attempts to resolve them. Target Date: 2023-10-24 Frequency: Daily Modality: individual Progress: 70%  Client Response full compliance  Service Location Location, [address] [place], [place] [address]  Service Code cpt [phone]  Lifestyle change (exercise, nutrition)  Self care activities  Provide education, information  Identify/label emotions  Facilitate problem solving  Normalize/Reframe  Validate/empathize  Related past to present  Comments  Adj. Disorder with Depression Meds.: none  Goals: Misao lost her husband suddenly several years ago when he collapsed while jogging. She has struggled to adjust to this traumatic loss, which has left a devastating void in her life. She is hoping to use counseling as a  way to re-define her life and find both purpose and happiness. She relied on husband for many things and she tends to suffer from a lack of confidence. She also relied on him to co-parent their daughter, who has some special needs and presents her with multiple challenges. She is also seeking additional guidance on parenting this adult daughter. Needs to adjust to having her daughter live with her, likely for the long-term. Focus on treatment evolving to address the complicated and  conflictual relationship with daughter Kit. Salome expresses many concerns/fears about this relationship, and is paralyzed by these fears and guilt in her efforts to effectively parent and set appropriate boundaries.  Goal date 12-24.  Patient agreed to a video Public affairs consultant) session and is aware of the limitations of the platform. She is at her home and I am at my home office.   Lovada and I discussed her appeal to the insurance company to approve Kit's therapist. [name] went to see the neurologist at [facility] She has been having significant head pain close to her eye. The doctor told her that it may be related to a nerve. She was prescribed medicine and they ordered a doppler of her carotid artery. The neurologist referred her for a neuropsych exam. [name] said it has been an "interesting few days" with Clydie Braun. Her husband works from home and Clydie Braun is wanting to move from [place] to [place]. [name] feels terrible that she cannot help Clydie Braun financially because of all the money she pays for Kit. She feels "so guilty" and is self-deprecating about making poor decisions related to Kit. She also talked about how angry [name] would be at her for the financial decisions she has made since his passing. Her decisions were guided by guilt and fear. We discussed needing to assert herself with Kit, and sharing financial concerns and necessary steps moving forward.                                                                                                                   [name], PhD Time:5:10p-6:00p 50 minutes

## 2023-08-25 NOTE — Telephone Encounter (Signed)
She is schedule with Dr. Rueben Bash on 10/24/23 and 11/23/23.

## 2023-08-30 ENCOUNTER — Other Ambulatory Visit: Payer: Self-pay | Admitting: Family Medicine

## 2023-08-30 DIAGNOSIS — I341 Nonrheumatic mitral (valve) prolapse: Secondary | ICD-10-CM

## 2023-08-30 DIAGNOSIS — R002 Palpitations: Secondary | ICD-10-CM

## 2023-09-07 DIAGNOSIS — M4316 Spondylolisthesis, lumbar region: Secondary | ICD-10-CM | POA: Diagnosis not present

## 2023-09-08 ENCOUNTER — Ambulatory Visit: Payer: Medicare Other | Admitting: Psychology

## 2023-09-08 DIAGNOSIS — F4321 Adjustment disorder with depressed mood: Secondary | ICD-10-CM | POA: Diagnosis not present

## 2023-09-08 NOTE — Progress Notes (Signed)
Brenda Hudson is a 72 y.o. female patient    Date: 09/08/2023  Treatment Plan: Diagnosis V62.82 (Uncomplicated bereavement) [n/a]  F43.21 (Adjustment Disorder, With depressed mood) [n/a]  F41.1 (Generalized anxiety disorder) [n/a]  Symptoms Excessive and/or unrealistic worry that is difficult to control occurring more days than not for at least 6 months about a number of events or activities. (Status: maintained) -- No Description Entered  Strong emotional response of sadness exhibited when losses are discussed. (Status: maintained) -- No Description Entered  Thoughts dominated by loss coupled with poor concentration, tearful spells, and confusion about the future. (Status: maintained) -- No Description Entered  Medication Status na  Safety none  If Suicidal or Homicidal State Action Taken: unspecified  Current Risk: low Medications unspecified Objectives Related Problem: Complete the process of letting go of the lost significant other. Description: Verbalize resolution of feelings of guilt and regret associated with the loss. Target Date: 2023-10-24 Frequency: Daily Modality: individual Progress: 90%  Related Problem: Complete the process of letting go of the lost significant other. Description: Participate in a therapy that addresses issues beyond grief that have arisen as a result of the loss. Target Date: 2023-10-24 Frequency: Daily Modality: individual Progress: 100%  Related Problem: Learn and implement coping skills that result in a reduction of anxiety and worry, and improved daily functioning. Description: Learn and implement  problem-solving strategies for realistically addressing worries. Target Date: 2023-10-24 Frequency: Daily Modality: individual Progress: 70%  Related Problem: Learn and implement coping skills that result in a reduction of anxiety and worry, and improved daily functioning. Description: Learn and implement calming skills to reduce overall anxiety and manage anxiety symptoms. Target Date: 2023-10-24 Frequency: Daily Modality: individual Progress: 70%  Related Problem: Learn and implement coping skills that result in a reduction of anxiety and worry, and improved daily functioning. Description: Describe situations, thoughts, feelings, and actions associated with anxieties and worries, their impact on functioning, and attempts to resolve them. Target Date: 2023-10-24 Frequency: Daily Modality: individual Progress: 70%  Client Response full compliance  Service Location Location, [address] [place], [place] [address]  Service Code cpt [phone]  Lifestyle change (exercise, nutrition)  Self care activities  Provide education, information  Identify/label emotions  Facilitate problem solving  Normalize/Reframe  Validate/empathize  Related past to present  Comments  Adj. Disorder with Depression Meds.: none  Goals: Trene lost her husband suddenly several years ago when he collapsed while jogging. She has struggled to adjust to this traumatic loss, which has  left a devastating void in her life. She is hoping to use counseling as a way to re-define her life and find both purpose and happiness. She relied on husband for many things and she tends to suffer from a lack of confidence. She also relied on him to co-parent their daughter, who has some special needs and presents her with multiple challenges. She is also seeking additional guidance on parenting this adult daughter. Needs to adjust to having her daughter live with her, likely for the long-term. Focus on treatment evolving to  address the complicated and conflictual relationship with daughter Kit. Agapita expresses many concerns/fears about this relationship, and is paralyzed by these fears and guilt in her efforts to effectively parent and set appropriate boundaries.  Goal date 12-24.  Patient agreed to a video Public affairs consultant) session and is aware of the limitations of the platform. She is at her home and I am at my home office.   Nakya says that her insurance appeal has not yet been set. She says they are "down to two horses". Her financial person has made it very clear that she needs to cut back expenses. We talked about the need for Crissie to have a conversation with Kit regarding the letter from her financial planner. Lurdes is anxious about what she needs to do. She is not sure she can afford to stay in this house and is unaware of her options.                                                                                                                    [name], PhD Time:5:10p-6:00p 50 minutes

## 2023-09-22 ENCOUNTER — Ambulatory Visit: Payer: Medicare Other | Admitting: Psychology

## 2023-09-22 DIAGNOSIS — F4321 Adjustment disorder with depressed mood: Secondary | ICD-10-CM | POA: Diagnosis not present

## 2023-09-22 NOTE — Progress Notes (Signed)
Brenda Hudson is a 72 y.o. female patient    Date: 09/22/2023  Treatment Plan: Diagnosis V62.82 (Uncomplicated bereavement) [n/a]  F43.21 (Adjustment Disorder, With depressed mood) [n/a]  F41.1 (Generalized anxiety disorder) [n/a]  Symptoms Excessive and/or unrealistic worry that is difficult to control occurring more days than not for at least 6 months about a number of events or activities. (Status: maintained) -- No Description Entered  Strong emotional response of sadness exhibited when losses are discussed. (Status: maintained) -- No Description Entered  Thoughts dominated by loss coupled with poor concentration, tearful spells, and confusion about the future. (Status: maintained) -- No Description Entered  Medication Status na  Safety none  If Suicidal or Homicidal State Action Taken: unspecified  Current Risk: low Medications unspecified Objectives Related Problem: Complete the process of letting go of the lost significant other. Description: Verbalize resolution of feelings of guilt and regret associated with the loss. Target Date: 2023-10-24 Frequency: Daily Modality: individual Progress: 90%  Related Problem: Complete the process of letting go of the lost significant other. Description: Participate in a therapy that addresses issues beyond grief that have arisen as a result of the loss. Target Date: 2023-10-24 Frequency: Daily Modality: individual Progress: 100%  Related Problem: Learn and implement coping skills that result in a reduction of anxiety and worry, and improved daily  functioning. Description: Learn and implement problem-solving strategies for realistically addressing worries. Target Date: 2023-10-24 Frequency: Daily Modality: individual Progress: 70%  Related Problem: Learn and implement coping skills that result in a reduction of anxiety and worry, and improved daily functioning. Description: Learn and implement calming skills to reduce overall anxiety and manage anxiety symptoms. Target Date: 2023-10-24 Frequency: Daily Modality: individual Progress: 70%  Related Problem: Learn and implement coping skills that result in a reduction of anxiety and worry, and improved daily functioning. Description: Describe situations, thoughts, feelings, and actions associated with anxieties and worries, their impact on functioning, and attempts to resolve them. Target Date: 2023-10-24 Frequency: Daily Modality: individual Progress: 70%  Client Response full compliance  Service Location Location, [address] [place], [place] [address]  Service Code cpt [phone]  Lifestyle change (exercise, nutrition)  Self care activities  Provide education, information  Identify/label emotions  Facilitate problem solving  Normalize/Reframe  Validate/empathize  Related past to present  Comments  Adj. Disorder with Depression Meds.: none  Goals: Gerlean lost her husband suddenly several years ago when  he collapsed while jogging. She has struggled to adjust to this traumatic loss, which has left a devastating void in her life. She is hoping to use counseling as a way to re-define her life and find both purpose and happiness. She relied on husband for many things and she tends to suffer from a lack of confidence. She also relied on him to co-parent their daughter, who has some special needs and presents her with multiple challenges. She is also seeking additional guidance on parenting this adult daughter. Needs to adjust to having her daughter live with her, likely for the  long-term. Focus on treatment evolving to address the complicated and conflictual relationship with daughter Kit. Sherita expresses many concerns/fears about this relationship, and is paralyzed by these fears and guilt in her efforts to effectively parent and set appropriate boundaries.  Goal date 12-24.   Patient agreed to a video Public affairs consultant) session and is aware of the limitations of the platform. She is at her home and I am at my home office.   Tiauna says that she and Kit have been doing "pretty well". She says that  Kit does say, "I am not your partner, I am your child". We talked about how to challenge Kit in a way that is logical and makes sense. Kit is bullying her and we talked about defending against those inappropriate interactions. [name] says that she has some difficult discussions to have with Kit sometime soon. Kit does have a hand surgery next week and [name] is worried about the therapist's bill since she will be there for the whole procedure. [name] needs to be empowered to have the difficult discussions with Kit. She is getting closer to be willing to take the "risk" to have those discussions.                                                                                                                        [name], PhD Time:3:10p-4:00p 50 minutes

## 2023-09-28 ENCOUNTER — Ambulatory Visit: Payer: Medicare Other

## 2023-09-28 VITALS — Wt 113.0 lb

## 2023-09-28 DIAGNOSIS — Z0000 Encounter for general adult medical examination without abnormal findings: Secondary | ICD-10-CM | POA: Diagnosis not present

## 2023-09-28 NOTE — Progress Notes (Signed)
Subjective:   Brenda Hudson is a 72 y.o. female who presents for Medicare Annual (Subsequent) preventive examination.  Visit Complete: Virtual I connected with  Brenda Hudson on 09/28/23 by a audio enabled telemedicine application and verified that I am speaking with the correct person using two identifiers.  Patient Location: Home  Provider Location: Home Office  I discussed the limitations of evaluation and management by telemedicine. The patient expressed understanding and agreed to proceed.  Vital Signs: Because this visit was a virtual/telehealth visit, some criteria may be missing or patient reported. Any vitals not documented were not able to be obtained and vitals that have been documented are patient reported.     Objective:    Today's Vitals   09/28/23 1455  Weight: 113 lb (51.3 kg)   Body mass index is 20.02 kg/m.     09/28/2023    3:00 PM 09/09/2022    2:36 PM 05/11/2021    9:37 AM 12/21/2019    3:07 PM 08/08/2014    1:40 PM  Advanced Directives  Does Patient Have a Medical Advance Directive? Yes Yes Yes Yes Yes  Type of Estate agent of [place];Living will Healthcare Power of [place];Living will Living will Living will;Healthcare Power of [employer] Power of Attorney  Does patient want to make changes to medical advance directive?    No - Patient declined   Copy of Healthcare Power of Attorney in Chart? No - copy requested No - copy requested  No - copy requested     Current Medications (verified) Outpatient Encounter Medications as of 09/28/2023  Medication Sig   ACETAMINOPHEN PO Take by mouth as needed.   Cholecalciferol (VITAMIN D3) 50 MCG (2000 UT) CAPS Take by mouth.   flecainide (TAMBOCOR) 100 MG tablet TAKE 1 TABLET BY MOUTH TWICE A DAY   gabapentin (NEURONTIN) 100 MG capsule Take 1 capsule (100 mg total) by mouth 3 (three) times daily.   Ibuprofen 200 MG CAPS Take 400 mg by mouth in the morning and at bedtime.     loratadine (CLARITIN) 10 MG tablet Take 10 mg by mouth daily as needed for allergies.   MELATONIN PO Take by mouth.   metoprolol succinate (TOPROL-XL) 25 MG 24 hr tablet TAKE 1 TABLET (25 MG TOTAL) BY MOUTH DAILY.   sertraline (ZOLOFT) 25 MG tablet TAKE 1 TABLET (25 MG TOTAL) BY MOUTH DAILY.   [DISCONTINUED] predniSONE (DELTASONE) 10 MG tablet Take with breakfast:  6 tab day 1, 5 tab day 2-3, 4 tab day 4-5, 3 tab day 5, 2 tab day 6   [DISCONTINUED] triamcinolone ointment (KENALOG) 0.5 % Apply 1 Application topically 2 (two) times daily. Apply to rash for itching & swelling.   No facility-administered encounter medications on file as of 09/28/2023.    Allergies (verified) Cefaclor, Penicillins, and Sulfonamide derivatives   History: Past Medical History:  Diagnosis Date   Allergy    Anxiety    Arthritis    Diverticulosis    Frequent PVCs    and PAC per pt/has had along time   GERD (gastroesophageal reflux disease)    Heart murmur    Hx of echocardiogram    a. Echo 12/13:  EF 55-60%, mild MR, normal diast fxn, no MVP   IBS (irritable bowel syndrome)    MVP (mitral valve prolapse)    Osteoporosis    Palpitations    Polymyalgia rheumatica (HCC)    Rectal pain    Spinal stenosis  Past Surgical History:  Procedure Laterality Date   COLONOSCOPY     DILATION AND CURETTAGE OF UTERUS     TONSILLECTOMY     TUBAL LIGATION     Family History  Problem Relation Age of Onset   Sleep apnea Mother    Arthritis Mother    Migraines Father    Diabetes Father        in 33s   Kidney disease Father        DM related, renal failure   Heart disease Father        CVA young age, Smoker, overweight   Colon polyps Father    Esophageal cancer Neg Hx    Rectal cancer Neg Hx    Stomach cancer Neg Hx    Colon cancer Neg Hx    Social History   Socioeconomic History   Marital status: Widowed    Spouse name: Not on file   Number of children: 2   Years of education: Not on file    Highest education level: Not on file  Occupational History   Occupation: Teacher    Comment: Special Education   Tobacco Use   Smoking status: Never   Smokeless tobacco: Never  Vaping Use   Vaping status: Never Used  Substance and Sexual Activity   Alcohol use: No   Drug use: No   Sexual activity: Not on file  Other Topics Concern   Not on file  Social History Narrative   Widowed in Oct 12, 2013 (husband sudden death MI despite being in great shape). 2 daughters. Oldest daughter pregnant in October 12, 2014-Phd student at [employer] in early christianity. Younger daughter Maralyn Sago grad student [employer] [place] in philosophy.       [employer]. Both she and her husband. Husband went to [employer] for lawschool. 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       Hobbies: part time job working with [employer] as Production designer, theatre/television/film (kids sang there) grades 3-12, regular walking, reading, dogs (2) 6 horses       Update 08/16/2023   Caffeine: none   Social Determinants of Health   Financial Resource Strain: Low Risk  (09/28/2023)   Overall Financial Resource Strain (CARDIA)    Difficulty of Paying Living Expenses: Not hard at all  Food Insecurity: No Food Insecurity (09/28/2023)   Hunger Vital Sign    Worried About Running Out of Food in the Last Year: Never true    Ran Out of Food in the Last Year: Never true  Transportation Needs: No Transportation Needs (09/28/2023)   PRAPARE - Administrator, Civil Service (Medical): No    Lack of Transportation (Non-Medical): No  Physical Activity: Insufficiently Active (09/28/2023)   Exercise Vital Sign    Days of Exercise per Week: 3 days    Minutes of Exercise per Session: 40 min  Stress: Stress Concern Present (09/28/2023)   [employer] of Occupational Health - Occupational Stress Questionnaire    Feeling of Stress : Very much  Social Connections: Moderately Isolated (09/28/2023)   Social Connection and Isolation  Panel [NHANES]    Frequency of Communication with Friends and Family: More than three times a week    Frequency of Social Gatherings with Friends and Family: More than three times a week    Attends Religious Services: More than 4 times per year    Active Member of [employer] or Organizations: No    Attends Banker Meetings: Never  Marital Status: Widowed    Tobacco Counseling Counseling given: Not Answered   Clinical Intake:  Pre-visit preparation completed: Yes  Pain : No/denies pain     BMI - recorded: 20.02 Nutritional Status: BMI of 19-24  Normal Diabetes: No  How often do you need to have someone help you when you read instructions, pamphlets, or other written materials from your doctor or pharmacy?: 1 - Never  Interpreter Needed?: No  Information entered by :: Lanier Ensign, LPN   Activities of Daily Living    09/28/2023    2:56 PM  In your present state of health, do you have any difficulty performing the following activities:  Hearing? 0  Vision? 0  Difficulty concentrating or making decisions? 0  Walking or climbing stairs? 0  Dressing or bathing? 0  Doing errands, shopping? 0  Preparing Food and eating ? N  Using the Toilet? N  In the past six months, have you accidently leaked urine? N  Do you have problems with loss of bowel control? N  Managing your Medications? N  Managing your Finances? N  Housekeeping or managing your Housekeeping? N    Patient Care Team: Shelva Majestic, MD as PCP - General (Family Medicine) Lars Masson, MD as PCP - Cardiology (Cardiology) Marinus Maw, MD as PCP - Electrophysiology (Cardiology) Shirlean Kelly, MD as Consulting Physician (Neurosurgery) Aquilla Hacker, PA-C as Physician Assistant (Otolaryngology) Marcelle Overlie, MD as Consulting Physician (Obstetrics and Gynecology) Napoleon Form, MD as Consulting Physician (Gastroenterology)  Indicate any recent Medical Services you may  have received from other than [facility] providers in the past year (date may be approximate).     Assessment:   This is a routine wellness examination for Brenda Hudson.  Hearing/Vision screen Hearing Screening - Comments:: Pt denies any hearing issues  Vision Screening - Comments:: Pt follows up with [facility]   Goals Addressed             This Visit's Progress    Patient Stated       Work on strengthening       Depression Screen    09/28/2023    2:59 PM 12/10/2022    4:08 PM 11/19/2022    3:22 PM 09/09/2022    2:34 PM 06/08/2022    4:04 PM 03/19/2022    2:55 PM 05/11/2021    9:36 AM  PHQ 2/9 Scores  PHQ - 2 Score 0 0 0 0 0 1 0  PHQ- 9 Score  1   2 6      Fall Risk    09/28/2023    3:01 PM 12/10/2022    4:08 PM 11/19/2022    3:22 PM 09/09/2022    2:37 PM 06/08/2022    4:05 PM  Fall Risk   Falls in the past year? 0 0 0 0 0  Number falls in past yr: 0 0 0 0 0  Injury with Fall? 0 0 0 0 0  Risk for fall due to : No Fall Risks No Fall Risks  Impaired vision No Fall Risks  Follow up Falls prevention discussed Falls evaluation completed Falls evaluation completed Falls prevention discussed Falls evaluation completed    MEDICARE RISK AT HOME: Medicare Risk at Home Any stairs in or around the home?: Yes If so, are there any without handrails?: No Home free of loose throw rugs in walkways, pet beds, electrical cords, etc?: Yes Adequate lighting in your home to reduce risk of falls?: Yes Life alert?:  No Use of a cane, walker or w/c?: No Grab bars in the bathroom?: Yes Shower chair or bench in shower?: No Elevated toilet seat or a handicapped toilet?: No  TIMED UP AND GO:  Was the test performed?  No    Cognitive Function:    12/13/2022    9:14 AM  MMSE - Mini Mental State Exam  Orientation to time 5  Orientation to Place 5  Registration 3  Attention/ Calculation 5  Recall 3  Language- name 2 objects 2  Language- repeat 1  Language- follow 3 step command 3   Language- read & follow direction 1  Write a sentence 1  Copy design 1  Total score 30        09/28/2023    3:02 PM 09/09/2022    2:38 PM 05/11/2021    9:40 AM 12/21/2019    3:08 PM  6CIT Screen  What Year? 0 points 0 points 0 points 0 points  What month? 0 points 0 points 0 points 0 points  What time? 0 points 0 points 0 points 0 points  Count back from 20 0 points 0 points 0 points 0 points  Months in reverse 0 points 0 points 0 points 0 points  Repeat phrase 0 points 0 points 0 points 0 points  Total Score 0 points 0 points 0 points 0 points    Immunizations Immunization History  Administered Date(s) Administered   Fluad Quad(high Dose 65+) 08/21/2019, 10/24/2020   Influenza Inj Mdck Quad With Preservative 11/15/2018   Influenza Split 11/25/2011   Influenza, High Dose Seasonal PF 07/26/2021   Influenza,inj,Quad PF,6+ Mos 08/28/2013, 09/10/2014, 08/05/2016, 09/22/2017   Influenza-Unspecified 08/02/2022   PFIZER Comirnaty(Gray Top)Covid-19 Tri-Sucrose Vaccine 08/13/2020, 03/27/2021   PFIZER(Purple Top)SARS-COV-2 Vaccination 12/23/2019, 01/17/2020   Pfizer Covid-19 Vaccine Bivalent Booster 44yrs & up 07/26/2021, 08/02/2022   Pneumococcal Conjugate-13 08/21/2019   Td 03/21/2008   Tdap 03/21/2008, 04/25/2015   Unspecified SARS-COV-2 Vaccination 07/26/2021   Zoster, Live 09/07/2013    TDAP status: Up to date  Flu Vaccine status: Due, Education has been provided regarding the importance of this vaccine. Advised may receive this vaccine at local pharmacy or Health Dept. Aware to provide a copy of the vaccination record if obtained from local pharmacy or Health Dept. Verbalized acceptance and understanding.  Pneumococcal vaccine status: Due, Education has been provided regarding the importance of this vaccine. Advised may receive this vaccine at local pharmacy or Health Dept. Aware to provide a copy of the vaccination record if obtained from local pharmacy or Health Dept.  Verbalized acceptance and understanding.  Covid-19 vaccine status: Information provided on how to obtain vaccines.   Qualifies for Shingles Vaccine? Yes   Zostavax completed No   Shingrix Completed?: No.    Education has been provided regarding the importance of this vaccine. Patient has been advised to call insurance company to determine out of pocket expense if they have not yet received this vaccine. Advised may also receive vaccine at local pharmacy or Health Dept. Verbalized acceptance and understanding.  Screening Tests Health Maintenance  Topic Date Due   Zoster Vaccines- Shingrix (1 of 2) 09/04/2003   MAMMOGRAM  05/25/2020   Pneumonia Vaccine 41+ Years old (2 of 2 - PPSV23 or PCV20) 08/20/2020   COVID-19 Vaccine (8 - 2023-24 season) 07/17/2023   INFLUENZA VACCINE  02/13/2024 (Originally 06/16/2023)   Medicare Annual Wellness (AWV)  09/27/2024   DTaP/Tdap/Td (4 - Td or Tdap) 04/24/2025   Colonoscopy  11/24/2028  DEXA SCAN  Completed   Hepatitis C Screening  Completed   HPV VACCINES  Aged Out    Health Maintenance  Health Maintenance Due  Topic Date Due   Zoster Vaccines- Shingrix (1 of 2) 09/04/2003   MAMMOGRAM  05/25/2020   Pneumonia Vaccine 19+ Years old (2 of 2 - PPSV23 or PCV20) 08/20/2020   COVID-19 Vaccine (8 - 2023-24 season) 07/17/2023    Colorectal cancer screening: Type of screening: Colonoscopy. Completed 11/24/18. Repeat every 10 years  Pt stated will scheduled with [facility]   Bone Density status: Completed 08/09/18. Results reflect: Bone density results: OSTEOPENIA. Repeat every 2 years.  Additional Screening:  Hepatitis C Screening:  Completed 07/07/18  Vision Screening: Recommended annual ophthalmology exams for early detection of glaucoma and other disorders of the eye. Is the patient up to date with their annual eye exam?  Yes  Who is the provider or what is the name of the office in which the patient attends annual eye exams? [facility]  If pt  is not established with a provider, would they like to be referred to a provider to establish care? No .   Dental Screening: Recommended annual dental exams for proper oral hygiene  Community Resource Referral / Chronic Care Management: CRR required this visit?  No   CCM required this visit?  No     Plan:     I have personally reviewed and noted the following in the patient's chart:   Medical and social history Use of alcohol, tobacco or illicit drugs  Current medications and supplements including opioid prescriptions. Patient is not currently taking opioid prescriptions. Functional ability and status Nutritional status Physical activity Advanced directives List of other physicians Hospitalizations, surgeries, and ER visits in previous 12 months Vitals Screenings to include cognitive, depression, and falls Referrals and appointments  In addition, I have reviewed and discussed with patient certain preventive protocols, quality metrics, and best practice recommendations. A written personalized care plan for preventive services as well as general preventive health recommendations were provided to patient.     Marzella Schlein, LPN   [id number]   After Visit Summary: (MyChart) Due to this being a telephonic visit, the after visit summary with patients personalized plan was offered to patient via MyChart   Nurse Notes: none

## 2023-09-28 NOTE — Patient Instructions (Signed)
Ms. Weitzel , Thank you for taking time to come for your Medicare Wellness Visit. I appreciate your ongoing commitment to your health goals. Please review the following plan we discussed and let me know if I can assist you in the future.   Referrals/Orders/Follow-Ups/Clinician Recommendations: works on strengthening Aim for 30 minutes of exercise or brisk walking, 6-8 glasses of water, and 5 servings of fruits and vegetables each day.    This is a list of the screening recommended for you and due dates:  Health Maintenance  Topic Date Due   Zoster (Shingles) Vaccine (1 of 2) 09/04/2003   Mammogram  05/25/2020   Pneumonia Vaccine (2 of 2 - PPSV23 or PCV20) 08/20/2020   Flu Shot  06/16/2023   COVID-19 Vaccine (8 - 2023-24 season) 07/17/2023   Medicare Annual Wellness Visit  09/27/2024   DTaP/Tdap/Td vaccine (4 - Td or Tdap) 04/24/2025   Colon Cancer Screening  11/24/2028   DEXA scan (bone density measurement)  Completed   Hepatitis C Screening  Completed   HPV Vaccine  Aged Out    Advanced directives: (Copy Requested) Please bring a copy of your health care power of attorney and living will to the office to be added to your chart at your convenience.  Next Medicare Annual Wellness Visit scheduled for next year: No

## 2023-09-29 DIAGNOSIS — M47892 Other spondylosis, cervical region: Secondary | ICD-10-CM | POA: Diagnosis not present

## 2023-09-29 DIAGNOSIS — M4316 Spondylolisthesis, lumbar region: Secondary | ICD-10-CM | POA: Diagnosis not present

## 2023-10-06 ENCOUNTER — Ambulatory Visit: Payer: Medicare Other | Admitting: Psychology

## 2023-10-06 DIAGNOSIS — F411 Generalized anxiety disorder: Secondary | ICD-10-CM | POA: Diagnosis not present

## 2023-10-06 DIAGNOSIS — F4321 Adjustment disorder with depressed mood: Secondary | ICD-10-CM | POA: Diagnosis not present

## 2023-10-06 DIAGNOSIS — M4316 Spondylolisthesis, lumbar region: Secondary | ICD-10-CM | POA: Diagnosis not present

## 2023-10-06 DIAGNOSIS — M47892 Other spondylosis, cervical region: Secondary | ICD-10-CM | POA: Diagnosis not present

## 2023-10-06 NOTE — Progress Notes (Signed)
Brenda Hudson is a 72 y.o. female patient    Date: 10/06/2023  Treatment Plan: Diagnosis V62.82 (Uncomplicated bereavement) [n/a]  F43.21 (Adjustment Disorder, With depressed mood) [n/a]  F41.1 (Generalized anxiety disorder) [n/a]  Symptoms Excessive and/or unrealistic worry that is difficult to control occurring more days than not for at least 6 months about a number of events or activities. (Status: maintained) -- No Description Entered  Strong emotional response of sadness exhibited when losses are discussed. (Status: maintained) -- No Description Entered  Thoughts dominated by loss coupled with poor concentration, tearful spells, and confusion about the future. (Status: maintained) -- No Description Entered  Medication Status na  Safety none  If Suicidal or Homicidal State Action Taken: unspecified  Current Risk: low Medications unspecified Objectives Related Problem: Complete the process of letting go of the lost significant other. Description: Verbalize resolution of feelings of guilt and regret associated with the loss. Target Date: 2023-10-24 Frequency: Daily Modality: individual Progress: 90%  Related Problem: Complete the process of letting go of the lost significant other. Description: Participate in a therapy that addresses issues beyond grief that have arisen as a result of the loss. Target Date: 2023-10-24 Frequency: Daily Modality: individual Progress: 100%  Related Problem: Learn and implement coping skills that result in a reduction of anxiety and worry, and improved  daily functioning. Description: Learn and implement problem-solving strategies for realistically addressing worries. Target Date: 2023-10-24 Frequency: Daily Modality: individual Progress: 70%  Related Problem: Learn and implement coping skills that result in a reduction of anxiety and worry, and improved daily functioning. Description: Learn and implement calming skills to reduce overall anxiety and manage anxiety symptoms. Target Date: 2023-10-24 Frequency: Daily Modality: individual Progress: 70%  Related Problem: Learn and implement coping skills that result in a reduction of anxiety and worry, and improved daily functioning. Description: Describe situations, thoughts, feelings, and actions associated with anxieties and worries, their impact on functioning, and attempts to resolve them. Target Date: 2023-10-24 Frequency: Daily Modality: individual Progress: 70%  Client Response full compliance  Service Location Location, [address] [place], [place] [address]  Service Code cpt [id number]  Lifestyle change (exercise, nutrition)  Self care activities  Provide education, information  Identify/label emotions  Facilitate problem solving  Normalize/Reframe  Validate/empathize  Related past to present  Comments  Adj. Disorder  with Depression Meds.: none  Goals: Brenda Hudson lost her husband suddenly several years ago when he collapsed while jogging. She has struggled to adjust to this traumatic loss, which has left a devastating void in her life. She is hoping to use counseling as a way to re-define her life and find both purpose and happiness. She relied on husband for many things and she tends to suffer from a lack of confidence. She also relied on him to co-parent their daughter, who has some special needs and presents her with multiple challenges. She is also seeking additional guidance on parenting this adult daughter. Needs to adjust to having her daughter live with her, likely  for the long-term. Focus on treatment evolving to address the complicated and conflictual relationship with daughter Brenda Hudson. Brenda Hudson expresses many concerns/fears about this relationship, and is paralyzed by these fears and guilt in her efforts to effectively parent and set appropriate boundaries.  Goal date 12-24.   Patient agreed to a video Public affairs consultant) session and is aware of the limitations of the platform. She is at her home and I am at my home office.   Brenda Hudson says "it has been a monumental week" because there was an egg transfer. They will know if the pregnancy occurs in the next couple of weeks. Brenda Hudson says she is "excited for Brenda Hudson", but she knows she now needs to have the difficult discussion with Brenda Hudson about finances and logistics. Brenda Hudson says she "didn't want to ruin Brenda Hudson's week" by bringing it up too soon. Brenda Hudson says she is very anxious and worries about how complicated her life will become. Talked to [facility] about the need to assert herself with Brenda Hudson and tolerate the "risk" she fears. She says she is "as stressed as can be" and it is having cognitive consequences. Brenda Hudson struggles to find a way to be responsible without feeling that she is being punitive or harsh. We talked specifics about having the conversations with Brenda Hudson.                                                                                                                         [name], PhD Time:3:10p-4:00p 50 minutes

## 2023-10-20 ENCOUNTER — Ambulatory Visit: Payer: Medicare Other | Admitting: Psychology

## 2023-10-20 DIAGNOSIS — N958 Other specified menopausal and perimenopausal disorders: Secondary | ICD-10-CM | POA: Diagnosis not present

## 2023-10-20 DIAGNOSIS — M8588 Other specified disorders of bone density and structure, other site: Secondary | ICD-10-CM | POA: Diagnosis not present

## 2023-10-20 DIAGNOSIS — M4316 Spondylolisthesis, lumbar region: Secondary | ICD-10-CM | POA: Diagnosis not present

## 2023-10-20 DIAGNOSIS — M47892 Other spondylosis, cervical region: Secondary | ICD-10-CM | POA: Diagnosis not present

## 2023-10-21 ENCOUNTER — Ambulatory Visit: Payer: Medicare Other | Admitting: Psychology

## 2023-10-21 DIAGNOSIS — F4321 Adjustment disorder with depressed mood: Secondary | ICD-10-CM

## 2023-10-21 NOTE — Progress Notes (Signed)
Brenda Hudson is a 72 y.o. female patient    Date: 10/21/2023  Treatment Plan: Diagnosis V62.82 (Uncomplicated bereavement) [n/a]  F43.21 (Adjustment Disorder, With depressed mood) [n/a]  F41.1 (Generalized anxiety disorder) [n/a]  Symptoms Excessive and/or unrealistic worry that is difficult to control occurring more days than not for at least 6 months about a number of events or activities. (Status: maintained) -- No Description Entered  Strong emotional response of sadness exhibited when losses are discussed. (Status: maintained) -- No Description Entered  Thoughts dominated by loss coupled with poor concentration, tearful spells, and confusion about the future. (Status: maintained) -- No Description Entered  Medication Status na  Safety none  If Suicidal or Homicidal State Action Taken: unspecified  Current Risk: low Medications unspecified Objectives Related Problem: Complete the process of letting go of the lost significant other. Description: Verbalize resolution of feelings of guilt and regret associated with the loss. Target Date: 2024-10-23 Frequency: Daily Modality: individual Progress: 90%  Related Problem: Complete the process of letting go of the lost significant other. Description: Participate in a therapy that addresses issues beyond grief that have arisen as a result of the loss. Target Date: 2023-10-24 Frequency: Daily Modality: individual Progress: 100%  Related Problem: Learn and implement coping skills that result in a reduction of  anxiety and worry, and improved daily functioning. Description: Learn and implement problem-solving strategies for realistically addressing worries. Target Date: 2024-10-23 Frequency: Daily Modality: individual Progress: 80%  Related Problem: Learn and implement coping skills that result in a reduction of anxiety and worry, and improved daily functioning. Description: Learn and implement calming skills to reduce overall anxiety and manage anxiety symptoms. Target Date: 2024-10-23 Frequency: Daily Modality: individual Progress: 80%  Related Problem: Learn and implement coping skills that result in a reduction of anxiety and worry, and improved daily functioning. Description: Describe situations, thoughts, feelings, and actions associated with anxieties and worries, their impact on functioning, and attempts to resolve them. Target Date: 2024-10-23 Frequency: Daily Modality: individual Progress: 80%  Client Response full compliance  Service Location Location, [address] [place], [place] [address]  Service Code cpt [phone]  Lifestyle change (exercise, nutrition)  Self care activities  Provide education, information  Identify/label emotions  Facilitate problem  solving  Normalize/Reframe  Validate/empathize  Related past to present  Comments  Adj. Disorder with Depression Meds.: none  Goals: Abiah lost her husband suddenly several years ago when he collapsed while jogging. She has struggled to adjust to this traumatic loss, which has left a devastating void in her life. She is hoping to use counseling as a way to re-define her life and find both purpose and happiness. She relied on husband for many things and she tends to suffer from a lack of confidence. She also relied on him to co-parent their daughter, who has some special needs and presents her with multiple challenges. She is also seeking additional guidance on parenting this adult daughter. Needs to adjust to having her  daughter live with her, likely for the long-term. Focus on treatment evolving to address the complicated and conflictual relationship with daughter Kit. Naviah expresses many concerns/fears about this relationship, and is paralyzed by these fears and guilt in her efforts to effectively parent and set appropriate boundaries.  Goal date 12-25.   Patient agreed to a video Public affairs consultant) session and is aware of the limitations of the platform. She is at her home and I am at my home office.   Kennetha says they found out that the surrogate is pregnant and there is even a possibility of twins. She agrees that there is NO CHOICE with regard to having financial discussions with Kit. She is told that there is a chance it may be twins. [name] is hugely stressed about the baby and their financial situation. We rehearsed how to have the difficult financial situation with Kit. She plans to have the talk with Kit on January 2. Will talk again before that date.                                                                                                                               [name], PhD Time:4:10p-5:00p 50 minutes

## 2023-10-24 ENCOUNTER — Telehealth: Payer: Self-pay | Admitting: Neurology

## 2023-10-24 NOTE — Telephone Encounter (Signed)
[facility] wanted to make neurologist aware;  patient's originally scheduled today and to come back for feedback obn 11/23/23. Patient cancelled appointment for today and reschedule for 01/01/23 and reschedule feedback appointment on 01/18/24

## 2023-10-24 NOTE — Telephone Encounter (Signed)
The patient's appointment with Dr. Lucia Gaskins is scheduled for 01/04/24. I called the patient. She is completely fine with pushing out the appt with Dr Lucia Gaskins for after she receives the memory test results on 3/5. We have rescheduled her appt for 01/23/24 at 1130 am check-in 30 minutes early. She appreciated the call.

## 2023-10-26 ENCOUNTER — Other Ambulatory Visit: Payer: Self-pay | Admitting: Family Medicine

## 2023-11-01 DIAGNOSIS — R208 Other disturbances of skin sensation: Secondary | ICD-10-CM | POA: Diagnosis not present

## 2023-11-01 DIAGNOSIS — L821 Other seborrheic keratosis: Secondary | ICD-10-CM | POA: Diagnosis not present

## 2023-11-01 DIAGNOSIS — L820 Inflamed seborrheic keratosis: Secondary | ICD-10-CM | POA: Diagnosis not present

## 2023-11-01 DIAGNOSIS — M47892 Other spondylosis, cervical region: Secondary | ICD-10-CM | POA: Diagnosis not present

## 2023-11-01 DIAGNOSIS — L2989 Other pruritus: Secondary | ICD-10-CM | POA: Diagnosis not present

## 2023-11-01 DIAGNOSIS — M4316 Spondylolisthesis, lumbar region: Secondary | ICD-10-CM | POA: Diagnosis not present

## 2023-11-01 DIAGNOSIS — L538 Other specified erythematous conditions: Secondary | ICD-10-CM | POA: Diagnosis not present

## 2023-11-01 DIAGNOSIS — D1801 Hemangioma of skin and subcutaneous tissue: Secondary | ICD-10-CM | POA: Diagnosis not present

## 2023-11-03 ENCOUNTER — Ambulatory Visit: Payer: Medicare Other | Admitting: Psychology

## 2023-11-17 ENCOUNTER — Ambulatory Visit (INDEPENDENT_AMBULATORY_CARE_PROVIDER_SITE_OTHER): Payer: Medicare HMO | Admitting: Psychology

## 2023-11-17 DIAGNOSIS — F4321 Adjustment disorder with depressed mood: Secondary | ICD-10-CM | POA: Diagnosis not present

## 2023-11-17 NOTE — Progress Notes (Signed)
 Brenda Hudson is a 73 y.o. female patient    Date: 11/17/2023  Treatment Plan: Diagnosis V62.82 (Uncomplicated bereavement) [n/a]  F43.21 (Adjustment Disorder, With depressed mood) [n/a]  F41.1 (Generalized anxiety disorder) [n/a]  Symptoms Excessive and/or unrealistic worry that is difficult to control occurring more days than not for at least 6 months about a number of events or activities. (Status: maintained) -- No Description Entered  Strong emotional response of sadness exhibited when losses are discussed. (Status: maintained) -- No Description Entered  Thoughts dominated by loss coupled with poor concentration, tearful spells, and confusion about the future. (Status: maintained) -- No Description Entered  Medication Status na  Safety none  If Suicidal or Homicidal State Action Taken: unspecified  Current Risk: low Medications unspecified Objectives Related Problem: Complete the process of letting go of the lost significant other. Description: Verbalize resolution of feelings of guilt and regret associated with the loss. Target Date: 2024-10-23 Frequency: Daily Modality: individual Progress: 90%  Related Problem: Complete the process of letting go of the lost significant other. Description: Participate in a therapy that addresses issues beyond grief that have arisen as a result of the loss. Target Date: 2023-10-24 Frequency: Daily Modality: individual Progress: 100%  Related Problem: Learn and implement coping skills that  result in a reduction of anxiety and worry, and improved daily functioning. Description: Learn and implement problem-solving strategies for realistically addressing worries. Target Date: 2024-10-23 Frequency: Daily Modality: individual Progress: 80%  Related Problem: Learn and implement coping skills that result in a reduction of anxiety and worry, and improved daily functioning. Description: Learn and implement calming skills to reduce overall anxiety and manage anxiety symptoms. Target Date: 2024-10-23 Frequency: Daily Modality: individual Progress: 80%  Related Problem: Learn and implement coping skills that result in a reduction of anxiety and worry, and improved daily functioning. Description: Describe situations, thoughts, feelings, and actions associated with anxieties and worries, their impact on functioning, and attempts to resolve them. Target Date: 2024-10-23 Frequency: Daily Modality: individual Progress: 80%  Client Response full compliance  Service Location Location, [address] [place], [place] [address]  Service Code cpt [phone]  Lifestyle change (exercise,  nutrition)  Self care activities  Provide education, information  Identify/label emotions  Facilitate problem solving  Normalize/Reframe  Validate/empathize  Related past to present  Comments  Adj. Disorder with Depression Meds.: none  Goals: Brenda Hudson lost her husband suddenly several years ago when he collapsed while jogging. She has struggled to adjust to this traumatic loss, which has left a devastating void in her life. She is hoping to use counseling as a way to re-define her life and find both purpose and happiness. She relied on husband for many things and she tends to suffer from a lack of confidence. She also relied on him to co-parent their daughter, who has some special needs and presents her with multiple challenges. She is also seeking additional guidance on parenting this adult daughter. Needs  to adjust to having her daughter live with her, likely for the long-term. Focus on treatment evolving to address the complicated and conflictual relationship with daughter Brenda Hudson. Brenda Hudson expresses many concerns/fears about this relationship, and is paralyzed by these fears and guilt in her efforts to effectively parent and set appropriate boundaries.  Goal date 12-25.   Patient agreed to a video Public Affairs Consultant) session and is aware of the limitations of the platform. She is at her home and I am at my home office.   George says that she and her daughters had one of the nicest holidays ever. Says that everyone got along well. The embryo is only one, which is a big relief. The surrogate is considered high risk due to age and large babies in the past.She and Brenda Hudson had a discussion that came up organically. Brenda Hudson says that she told Brenda Hudson about their financial situation and feels Brenda Hudson now understands. There was not any yelling and screaming. We talked about how much of her behavior is based on the guilt that Brenda Hudson has made her feel. The [employer] lue                                                                                                                                    CONI ALM KERNS, PhD Time:4:10p-5:00p 50 minutes

## 2023-11-30 ENCOUNTER — Ambulatory Visit: Payer: Medicare Other | Admitting: Psychology

## 2023-12-01 ENCOUNTER — Ambulatory Visit (INDEPENDENT_AMBULATORY_CARE_PROVIDER_SITE_OTHER): Payer: Medicare HMO | Admitting: Psychology

## 2023-12-01 DIAGNOSIS — F4321 Adjustment disorder with depressed mood: Secondary | ICD-10-CM

## 2023-12-01 NOTE — Progress Notes (Signed)
Brenda Hudson is a 73 y.o. female patient    Date: 12/01/2023  Treatment Plan: Diagnosis V62.82 (Uncomplicated bereavement) [n/a]  F43.21 (Adjustment Disorder, With depressed mood) [n/a]  F41.1 (Generalized anxiety disorder) [n/a]  Symptoms Excessive and/or unrealistic worry that is difficult to control occurring more days than not for at least 6 months about a number of events or activities. (Status: maintained) -- No Description Entered  Strong emotional response of sadness exhibited when losses are discussed. (Status: maintained) -- No Description Entered  Thoughts dominated by loss coupled with poor concentration, tearful spells, and confusion about the future. (Status: maintained) -- No Description Entered  Medication Status na  Safety none  If Suicidal or Homicidal State Action Taken: unspecified  Current Risk: low Medications unspecified Objectives Related Problem: Complete the process of letting go of the lost significant other. Description: Verbalize resolution of feelings of guilt and regret associated with the loss. Target Date: 2024-10-23 Frequency: Daily Modality: individual Progress: 90%  Related Problem: Complete the process of letting go of the lost significant other. Description: Participate in a therapy that addresses issues beyond grief that have arisen as a result of the loss. Target Date: 2023-10-24 Frequency: Daily Modality: individual Progress: 100%  Related Problem: Learn  and implement coping skills that result in a reduction of anxiety and worry, and improved daily functioning. Description: Learn and implement problem-solving strategies for realistically addressing worries. Target Date: 2024-10-23 Frequency: Daily Modality: individual Progress: 80%  Related Problem: Learn and implement coping skills that result in a reduction of anxiety and worry, and improved daily functioning. Description: Learn and implement calming skills to reduce overall anxiety and manage anxiety symptoms. Target Date: 2024-10-23 Frequency: Daily Modality: individual Progress: 80%  Related Problem: Learn and implement coping skills that result in a reduction of anxiety and worry, and improved daily functioning. Description: Describe situations, thoughts, feelings, and actions associated with anxieties and worries, their impact on functioning, and attempts to resolve them. Target Date: 2024-10-23 Frequency: Daily Modality: individual Progress: 80%  Client Response full compliance  Service Location Location, [address] [place], [place] [address]  Service Code cpt [phone]  Lifestyle change (exercise, nutrition)  Self care activities  Provide education, information  Identify/label emotions  Facilitate problem solving  Normalize/Reframe  Validate/empathize  Related past to present  Comments  Adj. Disorder with Depression Meds.: none  Goals: Brenda Hudson lost her husband suddenly several years ago when he collapsed while jogging. She has struggled to adjust to this traumatic loss, which has left a devastating void in her life. She is hoping to use counseling as a way to re-define her life and find both purpose and happiness. She relied on husband for many things and she tends to suffer from a lack of confidence. She also relied on him to co-parent their daughter, who has some special needs and presents her with multiple challenges. She is also seeking additional guidance on  parenting this adult daughter. Needs to adjust to having her daughter live with her, likely for the long-term. Focus on treatment evolving to address the complicated and conflictual relationship with daughter Kit. Brenda Hudson expresses many concerns/fears about this relationship, and is paralyzed by these fears and guilt in her efforts to effectively parent and set appropriate boundaries.  Goal date 12-25.   Patient agreed to a video Public affairs consultant) session and is aware of the limitations of the platform. She is at her home and I am at my home office.   [name] says that "things have actually been going pretty well". The surrogate pregnancy is progressing and the due date is around Aug. 1, but will likely deliver early. Had a good visit with brother in law. They had an extensive conversation about Bill's FOO. Kit told Brenda Hudson that she feels that her father was Borderline Personality. We talked about having a timeline plan  So that necessary actions get completed rather than postponed.                                                                                                                                         [name], PhD Time:4:10p-5:00p 50 minutes

## 2023-12-04 ENCOUNTER — Other Ambulatory Visit: Payer: Self-pay | Admitting: Internal Medicine

## 2023-12-04 DIAGNOSIS — I493 Ventricular premature depolarization: Secondary | ICD-10-CM

## 2023-12-08 DIAGNOSIS — Z01419 Encounter for gynecological examination (general) (routine) without abnormal findings: Secondary | ICD-10-CM | POA: Diagnosis not present

## 2023-12-08 DIAGNOSIS — Z1231 Encounter for screening mammogram for malignant neoplasm of breast: Secondary | ICD-10-CM | POA: Diagnosis not present

## 2023-12-08 DIAGNOSIS — Z6820 Body mass index (BMI) 20.0-20.9, adult: Secondary | ICD-10-CM | POA: Diagnosis not present

## 2023-12-08 LAB — HM MAMMOGRAPHY

## 2023-12-09 ENCOUNTER — Telehealth: Payer: Self-pay

## 2023-12-09 DIAGNOSIS — M4316 Spondylolisthesis, lumbar region: Secondary | ICD-10-CM | POA: Diagnosis not present

## 2023-12-09 NOTE — Telephone Encounter (Signed)
Preop clearance appt has now been scheduled.

## 2023-12-09 NOTE — Telephone Encounter (Signed)
   Name: Brenda Hudson  DOB: 1951/01/09  MRN: [id number]  Primary Cardiologist: Tobias Alexander, MD  Chart reviewed as part of pre-operative protocol coverage. Because of Brenda Hudson's past medical history and time since last visit, she will require a follow-up in-office visit in order to better assess preoperative cardiovascular risk.  Pre-op covering staff: - Please schedule appointment and call patient to inform them. If patient already had an upcoming appointment within acceptable timeframe, please add "pre-op clearance" to the appointment notes so provider is aware. - Please contact requesting surgeon's office via preferred method (i.e, phone, fax) to inform them of need for appointment prior to surgery.   Joylene Grapes, NP  12/09/2023, 11:43 AM

## 2023-12-09 NOTE — Telephone Encounter (Signed)
   Pre-operative Risk Assessment    Patient Name: RUTHELL FEIGENBAUM  DOB: 1951-04-15 MRN: [id number]   Date of last office visit: 11/19/22 Date of next office visit: n/a   Request for Surgical Clearance    Procedure:   PLIF L4-L5 Poterior lateral and  interbody fusion   Date of Surgery:  Clearance TBD                                 Surgeon:  Stefani Dama, MD  Surgeon's Group or Practice Name:  [facility]  Phone number:  [phone]  Fax number:  [phone]   Type of Clearance Requested:   - Medical    Type of Anesthesia:  General    Additional requests/questions:    Vance Peper   12/09/2023, 11:37 AM

## 2023-12-15 ENCOUNTER — Ambulatory Visit: Payer: Medicare HMO | Admitting: Psychology

## 2023-12-15 DIAGNOSIS — F4321 Adjustment disorder with depressed mood: Secondary | ICD-10-CM

## 2023-12-15 NOTE — Progress Notes (Signed)
Brenda Hudson is a 73 y.o. female patient    Date: 12/15/2023  Treatment Plan: Diagnosis V62.82 (Uncomplicated bereavement) [n/a]  F43.21 (Adjustment Disorder, With depressed mood) [n/a]  F41.1 (Generalized anxiety disorder) [n/a]  Symptoms Excessive and/or unrealistic worry that is difficult to control occurring more days than not for at least 6 months about a number of events or activities. (Status: maintained) -- No Description Entered  Strong emotional response of sadness exhibited when losses are discussed. (Status: maintained) -- No Description Entered  Thoughts dominated by loss coupled with poor concentration, tearful spells, and confusion about the future. (Status: maintained) -- No Description Entered  Medication Status na  Safety none  If Suicidal or Homicidal State Action Taken: unspecified  Current Risk: low Medications unspecified Objectives Related Problem: Complete the process of letting go of the lost significant other. Description: Verbalize resolution of feelings of guilt and regret associated with the loss. Target Date: 2024-10-23 Frequency: Daily Modality: individual Progress: 90%  Related Problem: Complete the process of letting go of the lost significant other. Description: Participate in a therapy that addresses issues beyond grief that have arisen as a result of the loss. Target Date: 2023-10-24 Frequency: Daily Modality: individual Progress:  100%  Related Problem: Learn and implement coping skills that result in a reduction of anxiety and worry, and improved daily functioning. Description: Learn and implement problem-solving strategies for realistically addressing worries. Target Date: 2024-10-23 Frequency: Daily Modality: individual Progress: 80%  Related Problem: Learn and implement coping skills that result in a reduction of anxiety and worry, and improved daily functioning. Description: Learn and implement calming skills to reduce overall anxiety and manage anxiety symptoms. Target Date: 2024-10-23 Frequency: Daily Modality: individual Progress: 80%  Related Problem: Learn and implement coping skills that result in a reduction of anxiety and worry, and improved daily functioning. Description: Describe situations, thoughts, feelings, and actions associated with anxieties and worries, their impact on functioning, and attempts to resolve them. Target Date: 2024-10-23 Frequency: Daily  Modality: individual Progress: 80%  Client Response full compliance  Service Location Location, [address] [place], [place] [address]  Service Code cpt [phone]  Lifestyle change (exercise, nutrition)  Self care activities  Provide education, information  Identify/label emotions  Facilitate problem solving  Normalize/Reframe  Validate/empathize  Related past to present  Comments  Adj. Disorder with Depression Meds.: none  Goals: Brenda Hudson lost her husband suddenly several years ago when he collapsed while jogging. She has struggled to adjust to this traumatic loss, which has left a devastating void in her life. She is hoping to use counseling as a way to re-define her life and find both purpose and happiness. She relied on husband for many things and she tends to suffer from a lack of confidence. She also relied on him to co-parent their daughter, who has some special needs and presents her with multiple challenges. She is also  seeking additional guidance on parenting this adult daughter. Needs to adjust to having her daughter live with her, likely for the long-term. Focus on treatment evolving to address the complicated and conflictual relationship with daughter Brenda Hudson. Brenda Hudson expresses many concerns/fears about this relationship, and is paralyzed by these fears and guilt in her efforts to effectively parent and set appropriate boundaries.  Goal date 12-25.   Patient agreed to a video Public affairs consultant) session and is aware of the limitations of the platform. She is at her home and I am at my home office.   Brenda Hudson says that Brenda Hudson will be hired full time next year. She is very excited that the insurance will start paying for Brenda Hudson's treatment. Brenda Hudson will have to have back surgery to address stenosis and get a fusion. Brenda Hudson says she still needs to be able to sit with Brenda Hudson and have budget finance discussions. Suggested she talk with her financial planner and she says she will in order to have a better understanding of what needs to happen.                                                                                                                                            [name], PhD Time:5:10p-6:00p 50 minutes

## 2023-12-15 NOTE — Progress Notes (Unsigned)
  Cardiology Office Note:  .   Date:  12/15/2023  ID:  Brenda, Hudson 1951/06/18, MRN [id number] PCP: Shelva Majestic, MD  [facility] Providers Cardiologist:  Tobias Alexander, MD Electrophysiologist:  Lewayne Bunting, MD {  History of Present Illness: .   Brenda Hudson is a 73 y.o. female w/PMHx of PAT, PVCs  She saw cards APP 08/18/21, she wasdoing well, palpitations well controlled, historically with some degree of SB on flecainide/BB without symptoms. Infrequent palpitations, triggered by stress/poor sleep, described as brief  She saw Dr. Ladona Ridgel 11/19/22, not sleeping well, personal worries/stressors, minimal if any symptoms of bradycardia, palpitations well controlled  Pending  PLIF L4-L5 Poterior lateral and interbody fusion not yet scheduled  Today's visit is scheduled as pre-op evaluation RCRI score is zero (0.4%)  ROS:   *** Symptoms, palps, brady *** capacity  Arrhythmia/AAD hx Flecainide started 2015  Studies Reviewed: [name]    EKG done today and reviewed by myself:  ***   Echo 06/2020 normal LVEF 60 to 65%, trivial to mild MR.  Holter monitor 06/19/2020 sinus bradycardia normal sinus rhythm and sinus tachycardia nonsustained atrial tachycardia 8 beats fastest 141 bpm    Risk Assessment/Calculations:    Physical Exam:   VS:  There were no vitals taken for this visit.   Wt Readings from Last 3 Encounters:  09/28/23 113 lb (51.3 kg)  08/16/23 115 lb (52.2 kg)  08/10/23 113 lb 8 oz (51.5 kg)    GEN: Well nourished, well developed in no acute distress NECK: No JVD; No carotid bruits CARDIAC: ***RRR, no murmurs, rubs, gallops RESPIRATORY:  *** CTA b/l without rales, wheezing or rhonchi  ABDOMEN: Soft, non-tender, non-distended EXTREMITIES:  No edema; No deformity   ASSESSMENT AND PLAN: .    PAT PVCs Longstanding flecainide w*** metoprolol *** intervals  Pre-op evaluation Low cardiac risk surgery *** low cardiac risk score ***      {Are you ordering a CV Procedure (e.g. stress test, cath, DCCV, TEE, etc)?   Press F2        :[id number]}     Dispo: ***  Signed, Sheilah Pigeon, PA-C

## 2023-12-16 ENCOUNTER — Ambulatory Visit: Payer: Medicare HMO | Admitting: Physician Assistant

## 2023-12-16 ENCOUNTER — Telehealth: Payer: Self-pay | Admitting: Internal Medicine

## 2023-12-16 DIAGNOSIS — I341 Nonrheumatic mitral (valve) prolapse: Secondary | ICD-10-CM

## 2023-12-16 DIAGNOSIS — R002 Palpitations: Secondary | ICD-10-CM

## 2023-12-16 MED ORDER — METOPROLOL SUCCINATE ER 25 MG PO TB24
25.0000 mg | ORAL_TABLET | Freq: Every day | ORAL | 0 refills | Status: DC
Start: 2023-12-16 — End: 2023-12-22

## 2023-12-16 NOTE — Telephone Encounter (Signed)
 Pt's medication was sent to pt's pharmacy as requested. Confirmation received.

## 2023-12-16 NOTE — Telephone Encounter (Signed)
*  STAT* If patient is at the pharmacy, call can be transferred to refill team.   1. Which medications need to be refilled? (please list name of each medication and dose if known)   metoprolol succinate (TOPROL-XL) 25 MG 24 hr tablet     4. Which pharmacy/location (including street and city if local pharmacy) is medication to be sent to? [id number] - [place], [place] - [address] AT [address] [facility] [address]     5. Do they need a 30 day or 90 day supply? 90

## 2023-12-21 NOTE — Progress Notes (Addendum)
  Cardiology Office Note:  .   Date:  12/21/2023  ID:  Brenda Hudson, DOB February 04, 1951, MRN [id number] PCP: Brenda Garnette KIDD, MD  [facility] Cardiologist:  Brenda Moose, MD Electrophysiologist:  Brenda Birmingham, MD {  History of Present Illness: .   Brenda Hudson is a 73 y.o. female w/PMHx of PAT, PVCs  She saw cards APP 08/18/21, she wasdoing well, palpitations well controlled, historically with some degree of SB on flecainide /BB without symptoms. Infrequent palpitations, triggered by stress/poor sleep, described as brief  She saw Dr. Birmingham 11/19/22, not sleeping well, personal worries/stressors, minimal if any symptoms of bradycardia, palpitations well controlled  Pending  PLIF L4-L5 Poterior lateral and interbody fusion not yet scheduled  Today's visit is scheduled as pre-op evaluation RCRI score is zero (0.4%)  ROS:   Doing well + palpitations, at her baseline they seem to wax and wane, no clear trigger for the days she feels more.  The behavior of them/how they feel have not changed for years, remains happy with her burden/control When asked about CP, she reports a random heaviness in her chest, can last for hours, does not radiate, change with exertion, no pattern/trigger, and goes back to her 20's unchanged. No near syncope or syncope, infrequently can feel a fleeting/transient lightheadedness No SOB  She has quite a bit of back pain though remains very active. Cares for her home, no difficulties with ADLS She has 3 dogs, a few cats, and other small pets, as well as  2 horses.  None of her physical activities are symptom provoking  She will infrequently note after being on her feet all day that her R lower leg is swollen, resolves with elevation, no pain, tenderness, no trauma This is also not new or changing Advised support stockings when she know she is going to have a long ady on her feet especially  She is out of her toprol   Arrhythmia/AAD  hx Flecainide  started 2015  Studies Reviewed: SABRA    EKG done today and reviewed by myself:  SR 64bpm, LAD, PAC PR , QRS , QTc (stable intervals), no acute/ischemic changes   Echo 06/2020 normal LVEF 60 to 65%, trivial to mild MR.  Holter monitor 06/19/2020 sinus bradycardia normal sinus rhythm and sinus tachycardia nonsustained atrial tachycardia 8 beats fastest 141 bpm    Risk Assessment/Calculations:    Physical Exam:   VS:  There were no vitals taken for this visit.   Wt Readings from Last 3 Encounters:  09/28/23 113 lb (51.3 kg)  08/16/23 115 lb (52.2 kg)  08/10/23 113 lb 8 oz (51.5 kg)    GEN: Well nourished, well developed in no acute distress NECK: No JVD; No carotid bruits CARDIAC: RRR, no murmurs, rubs, gallops RESPIRATORY:  CTA b/l without rales, wheezing or rhonchi  ABDOMEN: Soft, non-tender, non-distended EXTREMITIES:  No edema is noted today, no calf tenderness; No deformity or anormal findings  ASSESSMENT AND PLAN: .    PAT PVCs Longstanding flecainide  w/metoprolol  stable intervals She remains happy with her control  Urged to reach out of any changes in behavior, escalation  Pre-op evaluation Low cardiac risk surgery low cardiac risk score No cardiac contraindication to back surgery   Dispo: back in 70mo, sooner if needed  Signed, Brenda Hudson Arthur, PA-C

## 2023-12-22 ENCOUNTER — Encounter: Payer: Self-pay | Admitting: Physician Assistant

## 2023-12-22 ENCOUNTER — Ambulatory Visit: Payer: Medicare HMO | Attending: Physician Assistant | Admitting: Physician Assistant

## 2023-12-22 VITALS — BP 108/64 | HR 64 | Ht 62.5 in | Wt 114.4 lb

## 2023-12-22 DIAGNOSIS — I4719 Other supraventricular tachycardia: Secondary | ICD-10-CM | POA: Diagnosis not present

## 2023-12-22 DIAGNOSIS — Z5181 Encounter for therapeutic drug level monitoring: Secondary | ICD-10-CM | POA: Diagnosis not present

## 2023-12-22 DIAGNOSIS — R002 Palpitations: Secondary | ICD-10-CM

## 2023-12-22 DIAGNOSIS — Z79899 Other long term (current) drug therapy: Secondary | ICD-10-CM | POA: Diagnosis not present

## 2023-12-22 DIAGNOSIS — Z01818 Encounter for other preprocedural examination: Secondary | ICD-10-CM | POA: Diagnosis not present

## 2023-12-22 DIAGNOSIS — I493 Ventricular premature depolarization: Secondary | ICD-10-CM | POA: Diagnosis not present

## 2023-12-22 MED ORDER — METOPROLOL SUCCINATE ER 25 MG PO TB24: 25.0000 mg | ORAL_TABLET | Freq: Every day | ORAL | 3 refills | Status: DC

## 2023-12-22 NOTE — Patient Instructions (Signed)
 Medication Instructions:   Your physician recommends that you continue on your current medications as directed. Please refer to the Current Medication list given to you today.  *If you need a refill on your cardiac medications before your next appointment, please call your pharmacy*   Lab Work:  NONE ORDERED  TODAY    If you have labs (blood work) drawn today and your tests are completely normal, you will receive your results only by: MyChart Message (if you have MyChart) OR A paper copy in the mail If you have any lab test that is abnormal or we need to change your treatment, we will call you to review the results.   Testing/Procedures:  NONE ORDERED  TODAY     Follow-Up: At [facility], you and your health needs are our priority.  As part of our continuing mission to provide you with exceptional heart care, we have created designated Provider Care Teams.  These Care Teams include your primary Cardiologist (physician) and Advanced Practice Providers (APPs -  Physician Assistants and Nurse Practitioners) who all work together to provide you with the care you need, when you need it.  We recommend signing up for the patient portal called MyChart.  Sign up information is provided on this After Visit Summary.  MyChart is used to connect with patients for Virtual Visits (Telemedicine).  Patients are able to view lab/test results, encounter notes, upcoming appointments, etc.  Non-urgent messages can be sent to your provider as well.   To learn more about what you can do with MyChart, go to [email or web address].    Your next appointment:    6 month(s)  Provider:    You may see Danelle Birmingham, MD or one of the following Advanced Practice Providers on your designated Care Team:   Charlies Arthur, [place]  Other Instructions   r

## 2023-12-29 ENCOUNTER — Ambulatory Visit: Payer: Medicare HMO | Admitting: Psychology

## 2023-12-29 DIAGNOSIS — F4321 Adjustment disorder with depressed mood: Secondary | ICD-10-CM | POA: Diagnosis not present

## 2023-12-29 NOTE — Progress Notes (Signed)
Brenda Hudson is a 73 y.o. female patient    Date: 12/29/2023  Treatment Plan: Diagnosis V62.82 (Uncomplicated bereavement) [n/a]  F43.21 (Adjustment Disorder, With depressed mood) [n/a]  F41.1 (Generalized anxiety disorder) [n/a]  Symptoms Excessive and/or unrealistic worry that is difficult to control occurring more days than not for at least 6 months about a number of events or activities. (Status: maintained) -- No Description Entered  Strong emotional response of sadness exhibited when losses are discussed. (Status: maintained) -- No Description Entered  Thoughts dominated by loss coupled with poor concentration, tearful spells, and confusion about the future. (Status: maintained) -- No Description Entered  Medication Status na  Safety none  If Suicidal or Homicidal State Action Taken: unspecified  Current Risk: low Medications unspecified Objectives Related Problem: Complete the process of letting go of the lost significant other. Description: Verbalize resolution of feelings of guilt and regret associated with the loss. Target Date: 2024-10-23 Frequency: Daily Modality: individual Progress: 90%  Related Problem: Complete the process of letting go of the lost significant other. Description: Participate in a therapy that addresses issues beyond grief that have arisen as a result of the loss. Target Date: 2023-10-24 Frequency:  Daily Modality: individual Progress: 100%  Related Problem: Learn and implement coping skills that result in a reduction of anxiety and worry, and improved daily functioning. Description: Learn and implement problem-solving strategies for realistically addressing worries. Target Date: 2024-10-23 Frequency: Daily Modality: individual Progress: 80%  Related Problem: Learn and implement coping skills that result in a reduction of anxiety and worry, and improved daily functioning. Description: Learn and implement calming skills to reduce overall anxiety and manage anxiety symptoms. Target Date: 2024-10-23 Frequency: Daily Modality: individual Progress: 80%  Related Problem: Learn and implement coping skills that result in a reduction of anxiety and worry, and improved daily functioning. Description: Describe situations, thoughts, feelings, and actions associated with anxieties and  worries, their impact on functioning, and attempts to resolve them. Target Date: 2024-10-23 Frequency: Daily Modality: individual Progress: 80%  Client Response full compliance  Service Location Location, [address] [place], [place] [address]  Service Code cpt [phone]  Lifestyle change (exercise, nutrition)  Self care activities  Provide education, information  Identify/label emotions  Facilitate problem solving  Normalize/Reframe  Validate/empathize  Related past to present  Comments  Adj. Disorder with Depression Meds.: none  Goals: Brenda Hudson lost her husband suddenly several years ago when he collapsed while jogging. She has struggled to adjust to this traumatic loss, which has left a devastating void in her life. She is hoping to use counseling as a way to re-define her life and find both purpose and happiness. She relied on husband for many things and she tends to suffer from a lack of confidence. She also relied on him to co-parent their daughter, who has some special needs and presents her  with multiple challenges. She is also seeking additional guidance on parenting this adult daughter. Needs to adjust to having her daughter live with her, likely for the long-term. Focus on treatment evolving to address the complicated and conflictual relationship with daughter Brenda Hudson. Brenda Hudson expresses many concerns/fears about this relationship, and is paralyzed by these fears and guilt in her efforts to effectively parent and set appropriate boundaries.  Goal date 12-25.   Patient agreed to a video Public affairs consultant) session and is aware of the limitations of the platform. She is at her home and I am at my home office.   Brenda Hudson says they are starting to order baby furniture. She says that the pregnancy is coming along well. Baby will be delivered in late July. Brenda Hudson will take about 6 weeks with the baby and then Brenda Hudson will be the full time care-taker. She has a memory test with neurologist on Monday. Brenda Hudson will come for part of the appointment. Brenda Hudson is taking care of the house and all the animals and the finances. She says that 2/3 of her savings is gone and spending at current rate, she will be out of money long before she dies. She says that she is going to tell Brenda Hudson "it is either the horses or the house". She is making some appropriate decisions about talking with Brenda Hudson about their finances. Brenda Hudson is most concerned about Brenda Hudson's physical health. Brenda Hudson is abusing laxatives and Brenda Hudson is trying to get her to see a GI doctor.                                                                                                                                               Brenda Ridgel, PhD Time:5:10p-6:00p 50 minutes

## 2024-01-03 ENCOUNTER — Other Ambulatory Visit: Payer: Self-pay | Admitting: Internal Medicine

## 2024-01-03 DIAGNOSIS — I493 Ventricular premature depolarization: Secondary | ICD-10-CM

## 2024-01-04 ENCOUNTER — Ambulatory Visit: Payer: Medicare Other | Admitting: Neurology

## 2024-01-09 ENCOUNTER — Ambulatory Visit (HOSPITAL_COMMUNITY)
Admission: RE | Admit: 2024-01-09 | Discharge: 2024-01-09 | Disposition: A | Discharge status: Home / Self Care | Payer: Medicare HMO | Source: Ambulatory Visit | Attending: Neurology | Admitting: Neurology

## 2024-01-09 DIAGNOSIS — G441 Vascular headache, not elsewhere classified: Secondary | ICD-10-CM | POA: Insufficient documentation

## 2024-01-09 DIAGNOSIS — S15009A Unspecified injury of unspecified carotid artery, initial encounter: Secondary | ICD-10-CM | POA: Insufficient documentation

## 2024-01-09 NOTE — Telephone Encounter (Signed)
 Can you reschedule her appointment for June please?

## 2024-01-09 NOTE — Telephone Encounter (Signed)
 [facility] called in - patient cancelled her February testing appointment and has been rescheduled for the testing 4/21 and feedback appointment 5/7

## 2024-01-09 NOTE — Progress Notes (Signed)
 VASCULAR LAB    Carotid duplex has been performed.  See CV proc for preliminary results.   Starsky Nanna, RVT 01/09/2024, 9:34 AM

## 2024-01-09 NOTE — Telephone Encounter (Signed)
 Patient has been rescheduled with Dr. Lucia Gaskins for 04/23/24 at 11:30am

## 2024-01-11 DIAGNOSIS — S15009A Unspecified injury of unspecified carotid artery, initial encounter: Secondary | ICD-10-CM | POA: Insufficient documentation

## 2024-01-11 DIAGNOSIS — M4722 Other spondylosis with radiculopathy, cervical region: Secondary | ICD-10-CM | POA: Diagnosis not present

## 2024-01-12 ENCOUNTER — Ambulatory Visit: Payer: Medicare Other | Admitting: Psychology

## 2024-01-12 DIAGNOSIS — F4321 Adjustment disorder with depressed mood: Secondary | ICD-10-CM | POA: Diagnosis not present

## 2024-01-12 NOTE — Progress Notes (Signed)
 Brenda Hudson is a 73 y.o. female patient    Date: 01/12/2024  Treatment Plan: Diagnosis V62.82 (Uncomplicated bereavement) [n/a]  F43.21 (Adjustment Disorder, With depressed mood) [n/a]  F41.1 (Generalized anxiety disorder) [n/a]  Symptoms Excessive and/or unrealistic worry that is difficult to control occurring more days than not for at least 6 months about a number of events or activities. (Status: maintained) -- No Description Entered  Strong emotional response of sadness exhibited when losses are discussed. (Status: maintained) -- No Description Entered  Thoughts dominated by loss coupled with poor concentration, tearful spells, and confusion about the future. (Status: maintained) -- No Description Entered  Medication Status na  Safety none  If Suicidal or Homicidal State Action Taken: unspecified  Current Risk: low Medications unspecified Objectives Related Problem: Complete the process of letting go of the lost significant other. Description: Verbalize resolution of feelings of guilt and regret associated with the loss. Target Date: 2024-10-23 Frequency: Daily Modality: individual Progress: 90%  Related Problem: Complete the process of letting go of the lost significant other. Description: Participate in a therapy that addresses issues beyond grief that have arisen as a result of the loss. Target Date:  2023-10-24 Frequency: Daily Modality: individual Progress: 100%  Related Problem: Learn and implement coping skills that result in a reduction of anxiety and worry, and improved daily functioning. Description: Learn and implement problem-solving strategies for realistically addressing worries. Target Date: 2024-10-23 Frequency: Daily Modality: individual Progress: 80%  Related Problem: Learn and implement coping skills that result in a reduction of anxiety and worry, and improved daily functioning. Description: Learn and implement calming skills to reduce overall anxiety and manage anxiety symptoms. Target Date: 2024-10-23 Frequency: Daily Modality: individual Progress: 80%  Related Problem: Learn and implement coping skills that result in a reduction of anxiety and worry,  and improved daily functioning. Description: Describe situations, thoughts, feelings, and actions associated with anxieties and worries, their impact on functioning, and attempts to resolve them. Target Date: 2024-10-23 Frequency: Daily Modality: individual Progress: 80%  Client Response full compliance  Service Location Location, [address] [place], [place] [address]  Service Code cpt [phone]  Lifestyle change (exercise, nutrition)  Self care activities  Provide education, information  Identify/label emotions  Facilitate problem solving  Normalize/Reframe  Validate/empathize  Related past to present  Comments  Adj. Disorder with Depression Meds.: none  Goals: Brenda Hudson lost her husband suddenly several years ago when he collapsed while jogging. She has struggled to adjust to this traumatic loss, which has left a devastating void in her life. She is hoping to use counseling as a way to re-define her life and find both purpose and happiness. She relied on husband for many things and she tends to suffer from a lack of confidence. She also relied on him to co-parent their daughter, who has some special  needs and presents her with multiple challenges. She is also seeking additional guidance on parenting this adult daughter. Needs to adjust to having her daughter live with her, likely for the long-term. Focus on treatment evolving to address the complicated and conflictual relationship with daughter Brenda Hudson. Brenda Hudson expresses many concerns/fears about this relationship, and is paralyzed by these fears and guilt in her efforts to effectively parent and set appropriate boundaries.  Goal date 12-25.   Patient agreed to a video Public affairs consultant) session and is aware of the limitations of the platform. She is at her home and I am at my home office.   Brenda Hudson saw the neurosurgeon yesterday. She has stenosis and she is going to have a bone graph fusion with spacers. Normal activity after 1 weeks. She also has cervical issues and she needs an upper MI of cervical area. Baby due in July so she needs to have the surgery soon. She is not going to be able to care for her animals and the house. Has not told Brenda Hudson and is worried about how things will get done. She missed her cognitive test because she was not feeling well. Postponed until April 7th. Brenda Hudson says that she thinks "every day" about what Brenda Hudson went through and her lack of protecting her. Brenda Hudson says "we were always afraid" of Brenda Hudson and "she ruled the house". We talked about setting and maintaining appropriate limits and boundaries. Also, ways to stop enabling.                                                                                                                                                   [name], PhD Time:4:10p-5:00p 50 minutes

## 2024-01-19 DIAGNOSIS — M4802 Spinal stenosis, cervical region: Secondary | ICD-10-CM | POA: Diagnosis not present

## 2024-01-19 DIAGNOSIS — M47813 Spondylosis without myelopathy or radiculopathy, cervicothoracic region: Secondary | ICD-10-CM | POA: Diagnosis not present

## 2024-01-19 DIAGNOSIS — M4722 Other spondylosis with radiculopathy, cervical region: Secondary | ICD-10-CM | POA: Diagnosis not present

## 2024-01-19 DIAGNOSIS — M5030 Other cervical disc degeneration, unspecified cervical region: Secondary | ICD-10-CM | POA: Diagnosis not present

## 2024-01-23 ENCOUNTER — Ambulatory Visit: Payer: Medicare Other | Admitting: Neurology

## 2024-01-26 ENCOUNTER — Ambulatory Visit: Payer: Medicare Other | Admitting: Psychology

## 2024-02-09 ENCOUNTER — Ambulatory Visit: Payer: Medicare Other | Admitting: Psychology

## 2024-02-09 DIAGNOSIS — F4321 Adjustment disorder with depressed mood: Secondary | ICD-10-CM | POA: Diagnosis not present

## 2024-02-09 NOTE — Progress Notes (Signed)
 Brenda Hudson is a 73 y.o. female patient    Date: 02/09/2024  Treatment Plan: Diagnosis V62.82 (Uncomplicated bereavement) [n/a]  F43.21 (Adjustment Disorder, With depressed mood) [n/a]  F41.1 (Generalized anxiety disorder) [n/a]  Symptoms Excessive and/or unrealistic worry that is difficult to control occurring more days than not for at least 6 months about a number of events or activities. (Status: maintained) -- No Description Entered  Strong emotional response of sadness exhibited when losses are discussed. (Status: maintained) -- No Description Entered  Thoughts dominated by loss coupled with poor concentration, tearful spells, and confusion about the future. (Status: maintained) -- No Description Entered  Medication Status na  Safety none  If Suicidal or Homicidal State Action Taken: unspecified  Current Risk: low Medications unspecified Objectives Related Problem: Complete the process of letting go of the lost significant other. Description: Verbalize resolution of feelings of guilt and regret associated with the loss. Target Date: 2024-10-23 Frequency: Daily Modality: individual Progress: 90%  Related Problem: Complete the process of letting go of the lost significant other. Description: Participate in a therapy that addresses issues beyond grief that have arisen as a result  of the loss. Target Date: 2023-10-24 Frequency: Daily Modality: individual Progress: 100%  Related Problem: Learn and implement coping skills that result in a reduction of anxiety and worry, and improved daily functioning. Description: Learn and implement problem-solving strategies for realistically addressing worries. Target Date: 2024-10-23 Frequency: Daily Modality: individual Progress: 80%  Related Problem: Learn and implement coping skills that result in a reduction of anxiety and worry, and improved daily functioning. Description: Learn and implement calming skills to reduce overall anxiety and manage anxiety symptoms. Target Date: 2024-10-23 Frequency: Daily Modality: individual Progress: 80%  Related  Problem: Learn and implement coping skills that result in a reduction of anxiety and worry, and improved daily functioning. Description: Describe situations, thoughts, feelings, and actions associated with anxieties and worries, their impact on functioning, and attempts to resolve them. Target Date: 2024-10-23 Frequency: Daily Modality: individual Progress: 80%  Client Response full compliance  Service Location Location, [address] [place], [place] [address]  Service Code cpt [phone]  Lifestyle change (exercise, nutrition)  Self care activities  Provide education, information  Identify/label emotions  Facilitate problem solving  Normalize/Reframe  Validate/empathize  Related past to present  Comments  Adj. Disorder with Depression Meds.: none  Goals: Brenda Hudson lost her husband suddenly several years ago when he collapsed while jogging. She has struggled to adjust to this traumatic loss, which has left a devastating void in her life. She is hoping to use counseling as a way to re-define her life and find both purpose and happiness. She relied on husband for many things and she tends to suffer from a lack of confidence. She also relied on him to co-parent their  daughter, who has some special needs and presents her with multiple challenges. She is also seeking additional guidance on parenting this adult daughter. Needs to adjust to having her daughter live with her, likely for the long-term. Focus on treatment evolving to address the complicated and conflictual relationship with daughter Kit. Brenda Hudson expresses many concerns/fears about this relationship, and is paralyzed by these fears and guilt in her efforts to effectively parent and set appropriate boundaries.  Goal date 12-25.   Patient agreed to a video Public affairs consultant) session and is aware of the limitations of the platform. She is at her home and I am at my home office.   Brenda Hudson says that they are moving and Brenda Hudson feels it is a "good thing". It is much less expenvie, and it needs some work. Also, they were able to lease one of their horses today. Another horse is being sent to [place]. This will save 3,000 on horses/month and 1,000 on mortgage. In addition, Kit will have a full time job next year and will contribute to the mortgage. She had been more stressed than ever before and is now starting to feel some of the relief. The remaining issue is to reduce the amount she is spending with Kit's therapist. She says that with her move, "there is no way she can have her back surgery". Will opt for injections.                                                                                                                                                      [name], PhD Time:5:10p-6:00p 50 minutes

## 2024-02-23 ENCOUNTER — Ambulatory Visit: Payer: Medicare Other | Admitting: Psychology

## 2024-02-24 ENCOUNTER — Telehealth: Payer: Self-pay

## 2024-02-24 NOTE — Telephone Encounter (Signed)
   Pre-operative Risk Assessment    Patient Name: Brenda Hudson  DOB: 1951-01-19 MRN: [id number]   Date of last office visit: 12/22/23 Francis Dowse, PA-C Date of next office visit: NONE   Request for Surgical Clearance    Procedure:   LUMBAR FUSION  Date of Surgery:  Clearance TBD                                Surgeon:  DR Barnett Abu Surgeon's Group or Practice Name:  [facility] Phone number:  [phone]   EXT 8221 Fax number:  [phone] OR [phone] ATTEN: NIKKI   Type of Clearance Requested:   - Medical    Type of Anesthesia:  General    Additional requests/questions:    SignedMarlow Baars   02/24/2024, 9:31 AM

## 2024-03-01 ENCOUNTER — Telehealth: Payer: Self-pay | Admitting: Neurology

## 2024-03-01 NOTE — Telephone Encounter (Signed)
 Patient is scheduled with [facility]  Testing: 6/10 at 1:00 Feedback: 6/25 at 10:00  This was rescheduled from original appointment. Looks like her appt with dr. Tresia Fruit falls before this and she wanted to see her after-does this need to be r/s?

## 2024-03-01 NOTE — Telephone Encounter (Signed)
 I called pt and rescheduled her appt to later date 05-24-2024 at 1330 with Dr. Tresia Fruit   Dr Renfroe feedback is 05/09/2024.  Pt appreciated call back.

## 2024-03-08 ENCOUNTER — Ambulatory Visit: Payer: Medicare Other | Admitting: Psychology

## 2024-03-17 ENCOUNTER — Telehealth: Admitting: Family Medicine

## 2024-03-17 DIAGNOSIS — B029 Zoster without complications: Secondary | ICD-10-CM

## 2024-03-17 MED ORDER — VALACYCLOVIR HCL 1 G PO TABS: 1000.0000 mg | ORAL_TABLET | Freq: Three times a day (TID) | ORAL | 0 refills | Status: AC

## 2024-03-17 MED ORDER — GABAPENTIN 300 MG PO CAPS: 300.0000 mg | ORAL_CAPSULE | Freq: Two times a day (BID) | ORAL | 0 refills | Status: DC

## 2024-03-17 NOTE — Progress Notes (Signed)
 Virtual Visit Consent   Brenda Hudson, you are scheduled for a virtual visit with a [facility] provider today. Just as with appointments in the office, your consent must be obtained to participate. Your consent will be active for this visit and any virtual visit you may have with one of our providers in the next 365 days. If you have a MyChart account, a copy of this consent can be sent to you electronically.  As this is a virtual visit, video technology does not allow for your provider to perform a traditional examination. This may limit your provider's ability to fully assess your condition. If your provider identifies any concerns that need to be evaluated in person or the need to arrange testing (such as labs, EKG, etc.), we will make arrangements to do so. Although advances in technology are sophisticated, we cannot ensure that it will always work on either your end or our end. If the connection with a video visit is poor, the visit may have to be switched to a telephone visit. With either a video or telephone visit, we are not always able to ensure that we have a secure connection.  By engaging in this virtual visit, you consent to the provision of healthcare and authorize for your insurance to be billed (if applicable) for the services provided during this visit. Depending on your insurance coverage, you may receive a charge related to this service.  I need to obtain your verbal consent now. Are you willing to proceed with your visit today? Brenda Hudson has provided verbal consent on 03/17/2024 for a virtual visit (video or telephone). Brenda Hudson, [place]  Date: 03/17/2024 11:36 AM   Virtual Visit via Video Note   I, Brenda Hudson, connected with  Brenda Hudson  ([id number], 06/26/1951) on 03/17/24 at 11:30 AM EDT by a video-enabled telemedicine application and verified that I am speaking with the correct person using two identifiers.  Location: Patient: Virtual Visit Location  Patient: Home Provider: Virtual Visit Location Provider: Home Office   I discussed the limitations of evaluation and management by telemedicine and the availability of in person appointments. The patient expressed understanding and agreed to proceed.    History of Present Illness: Brenda Hudson is a 73 y.o. who identifies as a female who was assigned female at birth, and is being seen today for c/o I think I have shingles on her back.  Pt states she woke up with morning with a red strip on her back that is itchy and painful. Pt states this is the first time she had shingles before.     HPI: HPI  Problems:  Patient Active Problem List   Diagnosis Date Noted   Injury of carotid artery 01/11/2024   GAD (generalized anxiety disorder) 06/08/2022   Atrial tachycardia (HCC) 06/08/2022   Generalized osteoarthritis of multiple sites 09/30/2021   Migraine 03/13/2020   Osteopenia 01/02/2020   Headache, unspecified 08/21/2019   Spinal stenosis of lumbar region with neurogenic claudication 08/21/2019   Cervical spinal stenosis 08/21/2019   PVC's (premature ventricular contractions) 04/04/2018   Low back pain 09/30/2014   IBS (irritable bowel syndrome) 09/30/2014   Mitral valve prolapse 09/30/2014   Palpitations 09/10/2014   PAC (premature atrial contraction) 09/10/2014   History of polymyalgia rheumatica 03/21/2008    Allergies:  Allergies  Allergen Reactions   Cefaclor     REACTION: rash   Penicillins     REACTION: rash   Sulfonamide Derivatives  Severe Diffuse swelling including possibly tongue   Medications:  Current Outpatient Medications:    gabapentin  (NEURONTIN ) 300 MG capsule, Take 1 capsule (300 mg total) by mouth 2 (two) times daily for 7 days., Disp: 14 capsule, Rfl: 0   valACYclovir (VALTREX) 1000 MG tablet, Take 1 tablet (1,000 mg total) by mouth 3 (three) times daily for 7 days., Disp: 21 tablet, Rfl: 0   ACETAMINOPHEN PO, Take by mouth as needed., Disp: , Rfl:     Cholecalciferol (VITAMIN D3) 50 MCG (2000 UT) CAPS, Take by mouth., Disp: , Rfl:    flecainide  (TAMBOCOR ) 100 MG tablet, TAKE 1 TABLET BY MOUTH TWICE A DAY, Disp: 180 tablet, Rfl: 3   Ibuprofen 200 MG CAPS, Take 400 mg by mouth in the morning and at bedtime. , Disp: , Rfl:    loratadine (CLARITIN) 10 MG tablet, Take 10 mg by mouth daily as needed for allergies., Disp: , Rfl:    MELATONIN PO, Take by mouth., Disp: , Rfl:    metoprolol  succinate (TOPROL -XL) 25 MG 24 hr tablet, Take 1 tablet (25 mg total) by mouth daily., Disp: 90 tablet, Rfl: 3   sertraline  (ZOLOFT ) 25 MG tablet, TAKE 1 TABLET (25 MG TOTAL) BY MOUTH DAILY., Disp: 90 tablet, Rfl: 2  Observations/Objective: Patient is well-developed, well-nourished in no acute distress.  Resting comfortably at home.  Head is normocephalic, atraumatic.  No labored breathing.  Speech is clear and coherent with logical content.  Patient is alert and oriented at baseline.  Erythematous streak noted to left side of back in a dermatomal pattern   Assessment and Plan: 1. Herpes zoster without complication (Primary) - valACYclovir (VALTREX) 1000 MG tablet; Take 1 tablet (1,000 mg total) by mouth 3 (three) times daily for 7 days.  Dispense: 21 tablet; Refill: 0 - gabapentin  (NEURONTIN ) 300 MG capsule; Take 1 capsule (300 mg total) by mouth 2 (two) times daily for 7 days.  Dispense: 14 capsule; Refill: 0  -Start Medications as prescribed -Pt to follow up in person with PCP or urgent care for worsening symptoms  Follow Up Instructions: I discussed the assessment and treatment plan with the patient. The patient was provided an opportunity to ask questions and all were answered. The patient agreed with the plan and demonstrated an understanding of the instructions.  A copy of instructions were sent to the patient via MyChart unless otherwise noted below.    The patient was advised to call back or seek an in-person evaluation if the symptoms worsen or  if the condition fails to improve as anticipated.    Brenda Roys, PA-C

## 2024-03-17 NOTE — Patient Instructions (Signed)
 Brenda Hudson, thank you for joining Brenda Roys, PA-C for today's virtual visit.  While this provider is not your primary care provider (PCP), if your PCP is located in our provider database this encounter information will be shared with them immediately following your visit.   A [facility] MyChart account gives you access to today's visit and all your visits, tests, and labs performed at [facility] " click here if you don't have a [name] account or go to [email or web address]  Consent: (Patient) Brenda Hudson provided verbal consent for this virtual visit at the beginning of the encounter.  Current Medications:  Current Outpatient Medications:    gabapentin  (NEURONTIN ) 300 MG capsule, Take 1 capsule (300 mg total) by mouth 2 (two) times daily for 7 days., Disp: 14 capsule, Rfl: 0   valACYclovir (VALTREX) 1000 MG tablet, Take 1 tablet (1,000 mg total) by mouth 3 (three) times daily for 7 days., Disp: 21 tablet, Rfl: 0   ACETAMINOPHEN PO, Take by mouth as needed., Disp: , Rfl:    Cholecalciferol (VITAMIN D3) 50 MCG (2000 UT) CAPS, Take by mouth., Disp: , Rfl:    flecainide  (TAMBOCOR ) 100 MG tablet, TAKE 1 TABLET BY MOUTH TWICE A DAY, Disp: 180 tablet, Rfl: 3   Ibuprofen 200 MG CAPS, Take 400 mg by mouth in the morning and at bedtime. , Disp: , Rfl:    loratadine (CLARITIN) 10 MG tablet, Take 10 mg by mouth daily as needed for allergies., Disp: , Rfl:    MELATONIN PO, Take by mouth., Disp: , Rfl:    metoprolol  succinate (TOPROL -XL) 25 MG 24 hr tablet, Take 1 tablet (25 mg total) by mouth daily., Disp: 90 tablet, Rfl: 3   sertraline  (ZOLOFT ) 25 MG tablet, TAKE 1 TABLET (25 MG TOTAL) BY MOUTH DAILY., Disp: 90 tablet, Rfl: 2   Medications ordered in this encounter:  Meds ordered this encounter  Medications   valACYclovir (VALTREX) 1000 MG tablet    Sig: Take 1 tablet (1,000 mg total) by mouth 3 (three) times daily for 7 days.    Dispense:  21 tablet     Refill:  0   gabapentin  (NEURONTIN ) 300 MG capsule    Sig: Take 1 capsule (300 mg total) by mouth 2 (two) times daily for 7 days.    Dispense:  14 capsule    Refill:  0     *If you need refills on other medications prior to your next appointment, please contact your pharmacy*  Follow-Up: Call back or seek an in-person evaluation if the symptoms worsen or if the condition fails to improve as anticipated.   Virtual Care [phone]  Other Instructions Shingles  Shingles, or herpes zoster, is an infection. It gives you a skin rash and blisters. These infected areas may hurt a lot. Shingles only happens if: You've had chickenpox. You've been given a shot called a vaccine to protect you from getting chickenpox. Shingles is rare in this case. What are the causes? Shingles is caused by a germ called the varicella-zoster virus. This is the same germ that causes chickenpox. After you're exposed to the germ, it stays in your body but is dormant. This means it isn't active. Shingles happens if the germ becomes active again. This can happen years after you're first exposed to the germ. What increases the risk? You may be more likely to get shingles if: You're older than 71 years of age. You're under a lot of stress. You have  a weak immune system. The immune system is your body's defense system. It may be weak if: You have human immunodeficiency virus (HIV). You have acquired immunodeficiency syndrome (AIDS). You have cancer. You take medicines that weaken your immune system. These include organ transplant medicines. What are the signs or symptoms? The first symptoms of shingles may be itching, tingling, or pain. Your skin may feel like it's burning. A few days or weeks later, you'll get a rash. Here's what you can expect: The rash is likely to be on one side of your body. The rash may be shaped like a belt or a band. Over time, it will turn into blisters filled with  fluid. The blisters will break open and change into scabs. The scabs will dry up in about 2-3 weeks. You may also have: A fever. Chills. A headache. Nausea. How is this diagnosed? Shingles is diagnosed with a skin exam. A sample called a culture may be taken from one of your blisters and sent to a lab. This will show if you have shingles. How is this treated? The rash may last for several weeks. There's no cure for shingles, but your health care provider may give you medicines. These medicines may: Help with pain. Help with itching. Help with irritation and swelling. Help you get better sooner. Help to prevent long-term problems. If the rash is on your face, you may need to see an eye doctor or an ear, nose, and throat (ENT) doctor. Follow these instructions at home: Medicines Take your medicines only as told by your provider. Put an anti-itch cream or numbing cream on the rash or blisters as told by your provider. Relieving itching and discomfort  To help with itching: Put cold, wet cloths called cold compresses on the rash or blisters. Take a cool bath. Try adding baking soda or dry oatmeal to the water. Do not bathe in hot water. Use calamine lotion on the rash or blisters. You can get this type of lotion at the store. Blister and rash care Keep your rash covered with a loose bandage. Wear loose clothes that don't rub on your rash. Take care of your rash as told by your provider. Make sure you: Wash your hands with soap and water for at least 20 seconds before and after you change your bandage. If you can't use soap and water, use hand sanitizer. Keep your rash and blisters clean by washing them with mild soap and cool water. Change your bandage. Check your rash every day for signs of infection. Check for: More redness, swelling, or pain. Fluid or blood. Warmth. Pus or a bad smell. Do not scratch your rash. Do not pick at your blisters. To help you not scratch: Keep your  fingernails clean and cut short. Try to wear gloves or mittens when you sleep. General instructions Rest. Wash your hands often with soap and water for at least 20 seconds. If you can't use soap and water, use hand sanitizer. Washing your hands lowers your chance of getting a skin infection. Your infection can cause chickenpox in others. If you have blisters that aren't scabs yet, stay away from: Babies. Pregnant people. Children who have eczema. Older people who have organ transplants. People who have a long-term, or chronic, illness. Anyone who hasn't had chickenpox before. Anyone who hasn't gotten the chickenpox vaccine. How is this prevented? Vaccines are the best way to prevent you from getting chickenpox or shingles. Talk with your provider about getting these shots. Where to  find more information Centers for Disease Control and Prevention (CDC): TonerPromos.no Contact a health care provider if: Your pain doesn't get better with medicine. Your pain doesn't get better after the rash heals. You have any signs of infection around the rash. Your rash or blisters get worse. You have a fever or chills. Get help right away if: The rash is on your face or nose. You have pain in your face or by your eye. You lose feeling on one side of your face. You have trouble seeing. You have ear pain or ringing in your ear. This information is not intended to replace advice given to you by your health care provider. Make sure you discuss any questions you have with your health care provider. Document Revised: 08/04/2023 Document Reviewed: 12/17/2022 Elsevier Patient Education  2024 Elsevier Inc.   If you have been instructed to have an in-person evaluation today at a local Urgent Care facility, please use the link below. It will take you to a list of all of our available [facility], including address, phone number and hours of operation. Please do not delay care.  [facility]  If you or a family member do not have a primary care provider, use the link below to schedule a visit and establish care. When you choose a [facility] primary care physician or advanced practice provider, you gain a long-term partner in health. Find a Primary Care Provider  Learn more about [facility]'s in-office and virtual care options: [facility] - Get Care Now

## 2024-03-22 ENCOUNTER — Ambulatory Visit: Payer: Medicare Other | Admitting: Psychology

## 2024-03-28 ENCOUNTER — Ambulatory Visit: Admitting: Family Medicine

## 2024-03-29 ENCOUNTER — Ambulatory Visit (INDEPENDENT_AMBULATORY_CARE_PROVIDER_SITE_OTHER): Admitting: Family Medicine

## 2024-03-29 ENCOUNTER — Encounter: Payer: Self-pay | Admitting: Family Medicine

## 2024-03-29 VITALS — BP 100/80 | HR 50 | Temp 98.4°F | Ht 62.5 in | Wt 111.0 lb

## 2024-03-29 DIAGNOSIS — I491 Atrial premature depolarization: Secondary | ICD-10-CM

## 2024-03-29 DIAGNOSIS — E785 Hyperlipidemia, unspecified: Secondary | ICD-10-CM

## 2024-03-29 DIAGNOSIS — M4802 Spinal stenosis, cervical region: Secondary | ICD-10-CM

## 2024-03-29 DIAGNOSIS — F411 Generalized anxiety disorder: Secondary | ICD-10-CM

## 2024-03-29 DIAGNOSIS — M48062 Spinal stenosis, lumbar region with neurogenic claudication: Secondary | ICD-10-CM | POA: Diagnosis not present

## 2024-03-29 NOTE — Patient Instructions (Addendum)
 Shingrix maybe 1-2 years from now  Please stop by lab before you go If you have mychart- we will send your results within 3 business days of us  receiving them.  If you do not have mychart- we will call you about results within 5 business days of us  receiving them.  *please also note that you will see labs on mychart as soon as they post. I will later go in and write notes on them- will say "notes from Dr. Arlene Ben"   Recommended follow up: Return in about 9 months (around 12/30/2024) for physical or sooner if needed.Schedule b4 you leave.

## 2024-03-29 NOTE — Progress Notes (Signed)
 Phone [phone] In person visit   Subjective:   Brenda Hudson is a 73 y.o. year old very pleasant female patient who presents for/with See problem oriented charting Chief Complaint  Patient presents with   Herpes Zoster    Located on back, popped up about 10 days; went to Dr and got valtrex     Past Medical History-  Patient Active Problem List   Diagnosis Date Noted   Spinal stenosis of lumbar region with neurogenic claudication 08/21/2019    Priority: High   PAC (premature atrial contraction) 09/10/2014    Priority: High   GAD (generalized anxiety disorder) 06/08/2022    Priority: Medium    Migraine 03/13/2020    Priority: Medium    Osteopenia 01/02/2020    Priority: Medium    Cervical spinal stenosis 08/21/2019    Priority: Medium    Generalized osteoarthritis of multiple sites 09/30/2021    Priority: Low   Headache, unspecified 08/21/2019    Priority: Low   PVC's (premature ventricular contractions) 04/04/2018    Priority: Low   Low back pain 09/30/2014    Priority: Low   IBS (irritable bowel syndrome) 09/30/2014    Priority: Low   Mitral valve prolapse 09/30/2014    Priority: Low   Palpitations 09/10/2014    Priority: Low   History of polymyalgia rheumatica 03/21/2008    Priority: Low   Injury of carotid artery 01/11/2024   Atrial tachycardia (HCC) 06/08/2022    Medications- reviewed and updated Current Outpatient Medications  Medication Sig Dispense Refill   ACETAMINOPHEN PO Take by mouth as needed.     Cholecalciferol (VITAMIN D3) 50 MCG (2000 UT) CAPS Take by mouth.     flecainide  (TAMBOCOR ) 100 MG tablet TAKE 1 TABLET BY MOUTH TWICE A DAY 180 tablet 3   Ibuprofen 200 MG CAPS Take 400 mg by mouth in the morning and at bedtime.      loratadine (CLARITIN) 10 MG tablet Take 10 mg by mouth daily as needed for allergies.     MELATONIN PO Take by mouth.     metoprolol  succinate (TOPROL -XL) 25 MG 24 hr tablet Take 1 tablet (25 mg total) by mouth  daily. 90 tablet 3   sertraline  (ZOLOFT ) 25 MG tablet TAKE 1 TABLET (25 MG TOTAL) BY MOUTH DAILY. 90 tablet 2   No current facility-administered medications for this visit.     Objective:  BP 100/80   Pulse (!) 50   Temp 98.4 F (36.9 C)   Ht 5' 2.5" (1.588 m)   Wt 111 lb (50.3 kg)   SpO2 96%   BMI 19.98 kg/m  Gen: NAD, resting comfortably CV: Bradycardic but regular-no murmurs rubs or gallops Lungs: CTAB no crackles, wheeze, rhonchi Ext: Trace edema right greater than left Skin: warm, dry Hypertrophied DIP and PIP joints noted on visual inspection only    Assessment and Plan   # Social update--daughter pregnant through surrogacy and due in July!  Super excited -She is also moving soon-a lot on her plate right now  # Shingles S: Patient was evaluated on 03/17/2024 by video visit with [place]-at that time she reported concern for shingles-had a red strip on her back that was itchy and painful-on exam was noted to have erythematous streak noted to the left side of back in dermatomal pattern - She was started on Valtrex  for 7 days - For pain control was started on gabapentin  300 mg twice daily- she didn't take it long term- one day  and it caused swelling -sounds like was around l3-L4-   First week pain was pretty bad but improving- current level 4-5/10 down significantly.  A/P: Patient diagnosed with shingles 12 days ago our video visit-thankfully rash has resolved and pain has significantly improved with Valtrex  treatment.  Did not tolerate gabapentin .  Denies need for long-term pain management.  We discussed 7 days of treatment is adequate and does not need further  # Musculoskeletal concerns - Sees Dr. Ellery Guthrie tomorrow as she has both neck and low back issues (known lumbar stenosis with neurogenic claudication-worsening unfortunately as well as cervical spinal stenosis).  Reports her gait is off somewhat.  They have discussed possible surgery but she wants to hold off with  daughters pregnancy -Ongoing significant arthritis in the hands and knees-hands are particular bothersome.  Could refer to hand surgeon at later date.  She may try light Coban wraps to see if that is helpful for compression with activity  # Situational stress/GAD S: Medication: sertraline  25 mg A/P: Patient continues to feel sertraline  is beneficial at least taking the edge off-she plans to continue current medication.  Continues long-term visits with Dr. Gutterman  # Trigeminal neuralgia-saw Dr. Tresia Fruit in October 2024-gabapentin  was tried but she does not tolerate it due to edema at 100 or 300 milligrams-we will add to allergy list.  # Memory concerns-family history of dementia and neurology referred her for neuropsychological testing which is upcoming  #hyperlipidemia S: Medication: None Lab Results  Component Value Date   CHOL 213 (H) 01/28/2023   HDL 70 01/28/2023   LDLCALC 116 (H) 01/28/2023   LDLDIRECT 149.9 01/20/2011   TRIG 159 (H) 01/28/2023   CHOLHDL 3.0 01/28/2023   A/P: Mild elevations-update lipids and calculate ASCVD risk   % PACs/PVCs-managed by Dr. Julia Oats S:Patient is compliant with flecainide  100 mg twice daily and metoprolol  25 mg 24-hour tablet. A/P: Reports reasonable control-continue current medication   Recommended follow up: Return in about 9 months (around 12/30/2024) for physical or sooner if needed.Schedule b4 you leave. Future Appointments  Date Time Provider Department Center  04/05/2024  5:00 PM Audery Blazing, PhD [facility] None  04/19/2024  5:00 PM Audery Blazing, PhD [facility] None  05/03/2024  5:00 PM Audery Blazing, PhD [facility] None  05/24/2024  1:30 PM Glory Larsen, MD [facility] None  10/04/2024  1:40 PM [facility] ANNUAL WELLNESS VISIT 1 [facility]  01/04/2025 10:00 AM Almira Jaeger, MD [facility]    Lab/Order associations:   ICD-10-CM   1. Hyperlipidemia, unspecified hyperlipidemia type  E78.5 Comprehensive  metabolic panel with GFR    Lipid panel    CBC with Differential/Platelet    2. PAC (premature atrial contraction)  I49.1     3. Spinal stenosis of lumbar region with neurogenic claudication  M48.062     4. Cervical spinal stenosis  M48.02     5. GAD (generalized anxiety disorder)  F41.1       No orders of the defined types were placed in this encounter.   Return precautions advised.  Clarisa Crooked, MD

## 2024-03-30 ENCOUNTER — Ambulatory Visit: Payer: Self-pay | Admitting: Family Medicine

## 2024-03-30 DIAGNOSIS — M4316 Spondylolisthesis, lumbar region: Secondary | ICD-10-CM | POA: Diagnosis not present

## 2024-03-30 DIAGNOSIS — M4722 Other spondylosis with radiculopathy, cervical region: Secondary | ICD-10-CM | POA: Diagnosis not present

## 2024-03-30 LAB — CBC WITH DIFFERENTIAL/PLATELET
Basophils Absolute: 0 10*3/uL (ref 0.0–0.1)
Basophils Relative: 0.5 % (ref 0.0–3.0)
Eosinophils Absolute: 0.1 10*3/uL (ref 0.0–0.7)
Eosinophils Relative: 2 % (ref 0.0–5.0)
HCT: 38.5 % (ref 36.0–46.0)
Hemoglobin: 12.9 g/dL (ref 12.0–15.0)
Lymphocytes Relative: 24.9 % (ref 12.0–46.0)
Lymphs Abs: 1.3 10*3/uL (ref 0.7–4.0)
MCHC: 33.6 g/dL (ref 30.0–36.0)
MCV: 91.5 fl (ref 78.0–100.0)
Monocytes Absolute: 0.4 10*3/uL (ref 0.1–1.0)
Monocytes Relative: 7.2 % (ref 3.0–12.0)
Neutro Abs: 3.5 10*3/uL (ref 1.4–7.7)
Neutrophils Relative %: 65.4 % (ref 43.0–77.0)
Platelets: 257 10*3/uL (ref 150.0–400.0)
RBC: 4.2 Mil/uL (ref 3.87–5.11)
RDW: 14.2 % (ref 11.5–15.5)
WBC: 5.4 10*3/uL (ref 4.0–10.5)

## 2024-03-30 LAB — COMPREHENSIVE METABOLIC PANEL WITH GFR
ALT: 12 U/L (ref 0–35)
AST: 22 U/L (ref 0–37)
Albumin: 4.4 g/dL (ref 3.5–5.2)
Alkaline Phosphatase: 32 U/L — ABNORMAL LOW (ref 39–117)
BUN: 16 mg/dL (ref 6–23)
CO2: 32 meq/L (ref 19–32)
Calcium: 9.6 mg/dL (ref 8.4–10.5)
Chloride: 101 meq/L (ref 96–112)
Creatinine, Ser: 0.88 mg/dL (ref 0.40–1.20)
GFR: 66.51 mL/min (ref >60.00)
Glucose, Bld: 92 mg/dL (ref 70–99)
Potassium: 4.4 meq/L (ref 3.5–5.1)
Sodium: 140 meq/L (ref 135–145)
Total Bilirubin: 0.3 mg/dL (ref 0.2–1.2)
Total Protein: 7 g/dL (ref 6.0–8.3)

## 2024-03-30 LAB — LIPID PANEL
Cholesterol: 214 mg/dL — ABNORMAL HIGH (ref 0–200)
HDL: 72.1 mg/dL (ref >39.00)
LDL Cholesterol: 116 mg/dL — ABNORMAL HIGH (ref 0–99)
NonHDL: 141.98
Total CHOL/HDL Ratio: 3
Triglycerides: 130 mg/dL (ref 0.0–149.0)
VLDL: 26 mg/dL (ref 0.0–40.0)

## 2024-04-05 ENCOUNTER — Ambulatory Visit (INDEPENDENT_AMBULATORY_CARE_PROVIDER_SITE_OTHER): Payer: Medicare Other | Admitting: Psychology

## 2024-04-05 DIAGNOSIS — Z634 Disappearance and death of family member: Secondary | ICD-10-CM

## 2024-04-05 DIAGNOSIS — F4321 Adjustment disorder with depressed mood: Secondary | ICD-10-CM | POA: Diagnosis not present

## 2024-04-05 DIAGNOSIS — Z411 Encounter for cosmetic surgery: Secondary | ICD-10-CM | POA: Diagnosis not present

## 2024-04-05 NOTE — Progress Notes (Signed)
 Brenda Hudson is a 73 y.o. female patient    Date: 04/05/2024  Treatment Plan: Diagnosis V62.82 (Uncomplicated bereavement) [n/a]  F43.21 (Adjustment Disorder, With depressed mood) [n/a]  F41.1 (Generalized anxiety disorder) [n/a]  Symptoms Excessive and/or unrealistic worry that is difficult to control occurring more days than not for at least 6 months about a number of events or activities. (Status: maintained) -- No Description Entered  Strong emotional response of sadness exhibited when losses are discussed. (Status: maintained) -- No Description Entered  Thoughts dominated by loss coupled with poor concentration, tearful spells, and confusion about the future. (Status: maintained) -- No Description Entered  Medication Status na  Safety none  If Suicidal or Homicidal State Action Taken: unspecified  Current Risk: low Medications unspecified Objectives Related Problem: Complete the process of letting go of the lost significant other. Description: Verbalize resolution of feelings of guilt and regret associated with the loss. Target Date: 2024-10-23 Frequency: Daily Modality: individual Progress: 90%  Related Problem: Complete the process of letting go of the lost significant other. Description: Participate in a therapy that addresses issues beyond grief  that have arisen as a result of the loss. Target Date: 2023-10-24 Frequency: Daily Modality: individual Progress: 100%  Related Problem: Learn and implement coping skills that result in a reduction of anxiety and worry, and improved daily functioning. Description: Learn and implement problem-solving strategies for realistically addressing worries. Target Date: 2024-10-23 Frequency: Daily Modality: individual Progress: 80%  Related Problem: Learn and implement coping skills that result in a reduction of anxiety and worry, and improved daily functioning. Description: Learn and implement calming skills to reduce overall anxiety  and manage anxiety symptoms. Target Date: 2024-10-23 Frequency: Daily Modality: individual Progress: 80%  Related Problem: Learn and implement coping skills that result in a reduction of anxiety and worry, and improved daily functioning. Description: Describe situations, thoughts, feelings, and actions associated with anxieties and worries, their impact on functioning, and attempts to resolve them. Target Date: 2024-10-23 Frequency: Daily Modality: individual Progress: 80%  Client Response full compliance  Service Location Location, [address] [place], [place] [address]  Service Code cpt [phone]  Lifestyle change (exercise, nutrition)  Self care activities  Provide education, information  Identify/label emotions  Facilitate problem solving  Normalize/Reframe  Validate/empathize  Related past to present  Comments  Adj. Disorder with Depression Meds.: none  Goals: Brenda Hudson lost her husband suddenly several years ago when he collapsed while jogging. She has struggled to adjust to this traumatic loss, which has left a devastating void in her life. She is hoping to use counseling as a way to re-define her life and find both purpose and happiness. She relied on husband for many things and she tends to suffer from a lack of confidence. She also relied on  him to co-parent their daughter, who has some special needs and presents her with multiple challenges. She is also seeking additional guidance on parenting this adult daughter. Needs to adjust to having her daughter live with her, likely for the long-term. Focus on treatment evolving to address the complicated and conflictual relationship with daughter Brenda Hudson. Brenda Hudson expresses many concerns/fears about this relationship, and is paralyzed by these fears and guilt in her efforts to effectively parent and set appropriate boundaries.  Goal date 12-25.   Patient agreed to a video Public affairs consultant) session and is aware of the limitations of the platform. She is at her home and I am at my home office.   Ara says that she has elected to get injections in neck and back to address her pain. She needs surgery, but injections will help her put it off for a while. She is in process of moving, but has not sold her house yet. She is very anxious that she has two mortgages. She says the market has dried up and she has had to drop the price.  Brenda Hudson got very sick and has had to have 3 procedures. She is recovering well. 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 Her baby is due July 20th. Brenda Hudson says she thinks that she and Brenda Hudson are "getting along well".                                                                                                                                                        Brenda Nash, PhD Time:5:10p-6:00p 50 minutes

## 2024-04-19 ENCOUNTER — Ambulatory Visit: Payer: Medicare Other | Admitting: Psychology

## 2024-04-23 ENCOUNTER — Ambulatory Visit: Payer: Medicare Other | Admitting: Neurology

## 2024-04-24 DIAGNOSIS — R413 Other amnesia: Secondary | ICD-10-CM | POA: Diagnosis not present

## 2024-04-26 DIAGNOSIS — M5416 Radiculopathy, lumbar region: Secondary | ICD-10-CM | POA: Diagnosis not present

## 2024-04-26 DIAGNOSIS — M5116 Intervertebral disc disorders with radiculopathy, lumbar region: Secondary | ICD-10-CM | POA: Diagnosis not present

## 2024-05-03 ENCOUNTER — Ambulatory Visit: Admitting: Psychology

## 2024-05-09 DIAGNOSIS — R413 Other amnesia: Secondary | ICD-10-CM | POA: Diagnosis not present

## 2024-05-22 DIAGNOSIS — L820 Inflamed seborrheic keratosis: Secondary | ICD-10-CM | POA: Diagnosis not present

## 2024-05-22 DIAGNOSIS — L570 Actinic keratosis: Secondary | ICD-10-CM | POA: Diagnosis not present

## 2024-05-22 DIAGNOSIS — D492 Neoplasm of unspecified behavior of bone, soft tissue, and skin: Secondary | ICD-10-CM | POA: Diagnosis not present

## 2024-05-22 DIAGNOSIS — L538 Other specified erythematous conditions: Secondary | ICD-10-CM | POA: Diagnosis not present

## 2024-05-24 ENCOUNTER — Ambulatory Visit: Admitting: Neurology

## 2024-05-31 ENCOUNTER — Ambulatory Visit: Admitting: Psychology

## 2024-06-14 ENCOUNTER — Ambulatory Visit: Admitting: Psychology

## 2024-06-18 ENCOUNTER — Ambulatory Visit (INDEPENDENT_AMBULATORY_CARE_PROVIDER_SITE_OTHER): Admitting: Psychology

## 2024-06-18 DIAGNOSIS — F411 Generalized anxiety disorder: Secondary | ICD-10-CM

## 2024-06-18 DIAGNOSIS — F4321 Adjustment disorder with depressed mood: Secondary | ICD-10-CM

## 2024-06-18 NOTE — Progress Notes (Signed)
 Brenda Hudson is a 73 y.o. female patient    Date: 06/18/2024  Treatment Plan: Diagnosis V62.82 (Uncomplicated bereavement) [n/a]  F43.21 (Adjustment Disorder, With depressed mood) [n/a]  F41.1 (Generalized anxiety disorder) [n/a]  Symptoms Excessive and/or unrealistic worry that is difficult to control occurring more days than not for at least 6 months about a number of events or activities. (Status: maintained) -- No Description Entered  Strong emotional response of sadness exhibited when losses are discussed. (Status: maintained) -- No Description Entered  Thoughts dominated by loss coupled with poor concentration, tearful spells, and confusion about the future. (Status: maintained) -- No Description Entered  Medication Status na  Safety none  If Suicidal or Homicidal State Action Taken: unspecified  Current Risk: low Medications unspecified Objectives Related Problem: Complete the process of letting go of the lost significant other. Description: Verbalize resolution of feelings of guilt and regret associated with the loss. Target Date: 2024-10-23 Frequency: Daily Modality: individual Progress: 90%  Related Problem: Complete the process of letting go of the lost significant other. Description: Participate in a therapy that  addresses issues beyond grief that have arisen as a result of the loss. Target Date: 2023-10-24 Frequency: Daily Modality: individual Progress: 100%  Related Problem: Learn and implement coping skills that result in a reduction of anxiety and worry, and improved daily functioning. Description: Learn and implement problem-solving strategies for realistically addressing worries. Target Date: 2024-10-23 Frequency: Daily Modality: individual Progress: 80%  Related Problem: Learn and implement coping skills that result in a reduction of anxiety and  worry, and improved daily functioning. Description: Learn and implement calming skills to reduce overall anxiety and manage anxiety symptoms. Target Date: 2024-10-23 Frequency: Daily Modality: individual Progress: 80%  Related Problem: Learn and implement coping skills that result in a reduction of anxiety and worry, and improved daily functioning. Description: Describe situations, thoughts, feelings, and actions associated with anxieties and worries, their impact on functioning, and attempts to resolve them. Target Date: 2024-10-23 Frequency: Daily Modality: individual Progress: 80%  Client Response full compliance  Service Location Location, [address] [place], [place] [address]  Service Code cpt [phone]  Lifestyle change (exercise, nutrition)  Self care activities  Provide education, information  Identify/label emotions  Facilitate problem solving  Normalize/Reframe  Validate/empathize  Related past to present  Comments  Adj. Disorder with Depression Meds.: none  Goals: Brenda Hudson lost her husband suddenly several years ago when he collapsed while jogging. She has struggled to adjust to this traumatic loss, which has left a devastating void in her life. She is hoping to use counseling as a way to re-define her life and find both purpose and happiness. She relied on husband for many things and she tends to suffer from a lack of  confidence. She also relied on him to co-parent their daughter, who has some special needs and presents her with multiple challenges. She is also seeking additional guidance on parenting this adult daughter. Needs to adjust to having her daughter live with her, likely for the long-term. Focus on treatment evolving to address the complicated and conflictual relationship with daughter Brenda Hudson. Brenda Hudson expresses many concerns/fears about this relationship, and is paralyzed by these fears and guilt in her efforts to effectively parent and set appropriate boundaries.  Goal date 12-25.   Patient agreed to a video Public affairs consultant) session and is aware of the limitations of the platform. She is at her home and I am at my home office.   Brenda Hudson says that Brenda Hudson's professional relationship with her therapist is ending. She says that the therapist wants to have a more personal relationship and they are, therefore, ending the therapy. Brenda Hudson is very pleased. Brenda Hudson and Brenda Hudson went to [facility] for the birth of the baby. They had a 5 day vacation prior to the delivery of the baby. It was a c-section and it all went off well. Brenda Hudson also came for the delivery. It was good for Brenda Hudson and Brenda Hudson's relationship. They stayed another 5 days after the birth and then flew home. The baby [name] is very good. Only concern is that she appears a little bow-legged, but will see pediatrician soon to see if there needs to be intervention.  Brenda Hudson had cognitive testing and was told she was very smart and has a very mild cognitive impairment. Was told it could be nothing or a precursor to Alzheimer's. Will see neurologist this week. They did sell the house at a loss, but less stress of no longer having to pay mortgage. Discussed the importance of self-care.  CONI ALM KERNS,  PhD Time:11:40a-12:30p 50 minutes

## 2024-06-25 ENCOUNTER — Telehealth: Payer: Self-pay | Admitting: Neurology

## 2024-06-25 NOTE — Telephone Encounter (Signed)
 Request to reschedule appointment due to conflict

## 2024-06-26 ENCOUNTER — Ambulatory Visit (INDEPENDENT_AMBULATORY_CARE_PROVIDER_SITE_OTHER): Admitting: Psychology

## 2024-06-26 ENCOUNTER — Ambulatory Visit: Admitting: Neurology

## 2024-06-26 DIAGNOSIS — F4321 Adjustment disorder with depressed mood: Secondary | ICD-10-CM | POA: Diagnosis not present

## 2024-06-26 NOTE — Progress Notes (Signed)
 Brenda Hudson is a 73 y.o. female patient    Date: 06/26/2024  Treatment Plan: Diagnosis V62.82 (Uncomplicated bereavement) [n/a]  F43.21 (Adjustment Disorder, With depressed mood) [n/a]  F41.1 (Generalized anxiety disorder) [n/a]  Symptoms Excessive and/or unrealistic worry that is difficult to control occurring more days than not for at least 6 months about a number of events or activities. (Status: maintained) -- No Description Entered  Strong emotional response of sadness exhibited when losses are discussed. (Status: maintained) -- No Description Entered  Thoughts dominated by loss coupled with poor concentration, tearful spells, and confusion about the future. (Status: maintained) -- No Description Entered  Medication Status na  Safety none  If Suicidal or Homicidal State Action Taken: unspecified  Current Risk: low Medications unspecified Objectives Related Problem: Complete the process of letting go of the lost significant other. Description: Verbalize resolution of feelings of guilt and regret associated with the loss. Target Date: 2024-10-23 Frequency: Daily Modality: individual Progress: 90%  Related Problem: Complete the process of letting go of the lost significant other. Description:  Participate in a therapy that addresses issues beyond grief that have arisen as a result of the loss. Target Date: 2023-10-24 Frequency: Daily Modality: individual Progress: 100%  Related Problem: Learn and implement coping skills that result in a reduction of anxiety and worry, and improved daily functioning. Description: Learn and implement problem-solving strategies for realistically addressing worries. Target Date: 2024-10-23 Frequency: Daily Modality: individual Progress: 80%  Related Problem: Learn and implement coping skills that result in a reduction of anxiety and worry, and improved daily functioning. Description: Learn and implement calming skills to reduce overall anxiety and manage anxiety symptoms. Target Date: 2024-10-23 Frequency: Daily Modality: individual Progress: 80%  Related Problem: Learn and implement coping skills that result in a reduction of anxiety and worry, and improved daily functioning. Description: Describe situations, thoughts, feelings, and actions associated with anxieties and worries, their impact on functioning, and attempts to resolve them. Target Date: 2024-10-23 Frequency: Daily Modality: individual Progress: 80%  Client Response full compliance  Service Location Location, [address] [place], [place] [address]  Service Code cpt [phone]  Lifestyle change (exercise, nutrition)  Self care activities  Provide education, information  Identify/label emotions  Facilitate problem solving  Normalize/Reframe  Validate/empathize  Related past to present  Comments  Adj. Disorder with Depression Meds.: none  Goals: Brenda Hudson lost her husband suddenly several years ago when he collapsed while jogging. She has struggled to adjust to this traumatic loss, which has left a devastating void in her life. She is hoping to use counseling as a way to re-define her life and find both purpose and happiness. She relied on husband for many things and she  tends to suffer from a lack of confidence. She also relied on him to co-parent their daughter, who has some special needs and presents her with multiple challenges. She is also seeking additional guidance on parenting this adult daughter. Needs to adjust to having her daughter live with her, likely for the long-term. Focus on treatment evolving to address the complicated and conflictual relationship with daughter Brenda Hudson. Brenda Hudson expresses many concerns/fears about this relationship, and is paralyzed by these fears and guilt in her efforts to effectively parent and set appropriate boundaries.  Goal date 12-25.   Patient agreed to a video Public affairs consultant) session and is aware of the limitations of the platform. She is at her home and I am at my home office.   Brenda Hudson says that Brenda Hudson is back to work Tues, Wed, and Thursday. She and Brenda Hudson split the nights based on her schedule. Says the baby is very good and it is relatively easy so far. Also, Brenda Hudson has submitted her dissertation and will defend soon. Brenda Hudson is very proud of her and is feeling more optimistic than before. Brenda Hudson is worried about her cognitive ability and is worried about Brenda Hudson. Her kids often say, didn't we just discuss this?. She told them to stop saying this because it is distressing to her. Feels she is doing better than she thought she would with regard to the baby.  Brenda Hudson says that her sister in law has mental illness and is not able to refrain from saying inappropriate things. There is a lot of stress around how to manage her because she wants to come see the baby. Brenda Hudson feels she needs to protect Brenda Hudson from her Aunt. Talked about how Brenda Hudson can and should carve out time for herself.  Brenda ALM KERNS, PhD Time:4:15p-5:00p 45 minutes

## 2024-06-28 ENCOUNTER — Ambulatory Visit: Admitting: Psychology

## 2024-07-12 ENCOUNTER — Ambulatory Visit: Admitting: Psychology

## 2024-07-22 ENCOUNTER — Other Ambulatory Visit: Payer: Self-pay | Admitting: Family Medicine

## 2024-07-26 ENCOUNTER — Ambulatory Visit: Admitting: Psychology

## 2024-08-09 ENCOUNTER — Ambulatory Visit (INDEPENDENT_AMBULATORY_CARE_PROVIDER_SITE_OTHER): Admitting: Psychology

## 2024-08-09 DIAGNOSIS — F4321 Adjustment disorder with depressed mood: Secondary | ICD-10-CM | POA: Diagnosis not present

## 2024-08-09 DIAGNOSIS — F411 Generalized anxiety disorder: Secondary | ICD-10-CM | POA: Diagnosis not present

## 2024-08-09 NOTE — Progress Notes (Signed)
 Brenda Hudson is a 73 y.o. female patient    Date: 08/09/2024  Treatment Plan: Diagnosis V62.82 (Uncomplicated bereavement) [n/a]  F43.21 (Adjustment Disorder, With depressed mood) [n/a]  F41.1 (Generalized anxiety disorder) [n/a]  Symptoms Excessive and/or unrealistic worry that is difficult to control occurring more days than not for at least 6 months about a number of events or activities. (Status: maintained) -- No Description Entered  Strong emotional response of sadness exhibited when losses are discussed. (Status: maintained) -- No Description Entered  Thoughts dominated by loss coupled with poor concentration, tearful spells, and confusion about the future. (Status: maintained) -- No Description Entered  Medication Status na  Safety none  If Suicidal or Homicidal State Action Taken: unspecified  Current Risk: low Medications unspecified Objectives Related Problem: Complete the process of letting go of the lost significant other. Description: Verbalize resolution of feelings of guilt and regret associated with the loss. Target Date: 2024-10-23 Frequency: Daily Modality: individual Progress: 90%  Related Problem: Complete the process of letting go of the lost  significant other. Description: Participate in a therapy that addresses issues beyond grief that have arisen as a result of the loss. Target Date: 2023-10-24 Frequency: Daily Modality: individual Progress: 100%  Related Problem: Learn and implement coping skills that result in a reduction of anxiety and worry, and improved daily functioning. Description: Learn and implement problem-solving  strategies for realistically addressing worries. Target Date: 2024-10-23 Frequency: Daily Modality: individual Progress: 80%  Related Problem: Learn and implement coping skills that result in a reduction of anxiety and worry, and improved daily functioning. Description: Learn and implement calming skills to reduce overall anxiety and manage anxiety symptoms. Target Date: 2024-10-23 Frequency: Daily Modality: individual Progress: 80%  Related Problem: Learn and implement coping skills that result in a reduction of anxiety and worry, and improved daily functioning. Description: Describe situations, thoughts, feelings, and actions associated with anxieties and worries, their impact on functioning, and attempts to resolve them. Target Date: 2024-10-23 Frequency: Daily Modality: individual Progress: 80%  Client Response full compliance  Service Location Location, [address] [place], [place] [address]  Service Code cpt [phone]  Lifestyle change (exercise, nutrition)  Self care activities  Provide education, information  Identify/label emotions  Facilitate problem solving  Normalize/Reframe  Validate/empathize  Related past to present  Comments  Adj. Disorder with Depression Meds.: none  Goals: Myrah lost her husband suddenly several years ago when he collapsed while jogging. She has struggled to adjust to this traumatic loss, which has left a devastating void in her life. She is hoping to use counseling as a way to re-define her life and find both purpose and happiness. She relied on  husband for many things and she tends to suffer from a lack of confidence. She also relied on him to co-parent their daughter, who has some special needs and presents her with multiple challenges. She is also seeking additional guidance on parenting this adult daughter. Needs to adjust to having her daughter live with her, likely for the long-term. Focus on treatment evolving to address the complicated and conflictual relationship with daughter Kit. Rishita expresses many concerns/fears about this relationship, and is paralyzed by these fears and guilt in her efforts to effectively parent and set appropriate boundaries.  Goal date 12-25.   Patient agreed to a video Public affairs consultant) session and is aware of the limitations of the platform. She is at her home and I am at my home office.   Arnita says her granddaughter is 2 months old today. She says that she is a good baby and that she is totally exhausted. She is also in a lot of pain from her back. She is going to hold off surgery until June because of her daughter's job. Kit is not able to take care of the baby at night when she is teaching the next day, which means that Rhealyn is taking on more of the child care. Donny is doing too much of the care-taking, even when Kit is available. She agrees that she needs to start taking some time for herself. Will make this effort before next session. She is now down to owning 1 horse. Sold or donated the other horses. She and Kit have worked out a Physiological scientist and Kit is being supportive and understands the need. Donny says the main objective is for her to start taking more time for herself. She doesn't take ANY time for herself. Donny says that another issue is that her sister and brother in law are toxic. They even showed up at [facility] and were very inappropriate. We talked about how to have the very difficult conversation with them about why they are reluctant to be around them. She needs to tell them how hard  this is for her to say, but tell them it is because it is important for them to preserve the relationship.  CONI ALM KERNS, PhD Time:5:15p-6:00p 45 minutes

## 2024-08-22 ENCOUNTER — Telehealth: Payer: Self-pay | Admitting: Neurology

## 2024-08-22 NOTE — Telephone Encounter (Signed)
 Called and left voicemail for patient to reschedule appointment on 10/31/24 with Dr Ines.  If patient calls back, they can be rescheduled with Dr. Margaret

## 2024-08-23 ENCOUNTER — Ambulatory Visit: Admitting: Psychology

## 2024-09-06 ENCOUNTER — Ambulatory Visit (INDEPENDENT_AMBULATORY_CARE_PROVIDER_SITE_OTHER): Admitting: Psychology

## 2024-09-06 DIAGNOSIS — F411 Generalized anxiety disorder: Secondary | ICD-10-CM

## 2024-09-06 DIAGNOSIS — Z634 Disappearance and death of family member: Secondary | ICD-10-CM | POA: Diagnosis not present

## 2024-09-06 DIAGNOSIS — F4321 Adjustment disorder with depressed mood: Secondary | ICD-10-CM

## 2024-09-06 NOTE — Progress Notes (Signed)
 Brenda Hudson is a 73 y.o. female patient    Date: 09/06/2024  Treatment Plan: Diagnosis V62.82 (Uncomplicated bereavement) [n/a]  F43.21 (Adjustment Disorder, With depressed mood) [n/a]  F41.1 (Generalized anxiety disorder) [n/a]  Symptoms Excessive and/or unrealistic worry that is difficult to control occurring more days than not for at least 6 months about a number of events or activities. (Status: maintained) -- No Description Entered  Strong emotional response of sadness exhibited when losses are discussed. (Status: maintained) -- No Description Entered  Thoughts dominated by loss coupled with poor concentration, tearful spells, and confusion about the future. (Status: maintained) -- No Description Entered  Medication Status na  Safety none  If Suicidal or Homicidal State Action Taken: unspecified  Current Risk: low Medications unspecified Objectives Related Problem: Complete the process of letting go of the lost significant other. Description: Verbalize resolution of feelings of guilt and regret associated with the loss. Target Date: 2024-10-23 Frequency: Daily Modality: individual Progress: 90%  Related Problem: Complete the  process of letting go of the lost significant other. Description: Participate in a therapy that addresses issues beyond grief that have arisen as a result of the loss. Target Date: 2023-10-24 Frequency: Daily Modality: individual Progress: 100%  Related Problem: Learn and implement coping skills that result in  a reduction of anxiety and worry, and improved daily functioning. Description: Learn and implement problem-solving strategies for realistically addressing worries. Target Date: 2024-10-23 Frequency: Daily Modality: individual Progress: 80%  Related Problem: Learn and implement coping skills that result in a reduction of anxiety and worry, and improved daily functioning. Description: Learn and implement calming skills to reduce overall anxiety and manage anxiety symptoms. Target Date: 2024-10-23 Frequency: Daily Modality: individual Progress: 80%  Related Problem: Learn and implement coping skills that result in a reduction of anxiety and worry, and improved daily functioning. Description: Describe situations, thoughts, feelings, and actions associated with anxieties and worries, their impact on functioning, and attempts to resolve them. Target Date: 2024-10-23 Frequency: Daily Modality: individual Progress: 80%  Client Response full compliance  Service Location Location, [address] [place], [place] [address]  Service Code cpt [id number]  Lifestyle change (exercise, nutrition)  Self care activities  Provide education, information  Identify/label emotions  Facilitate problem solving  Normalize/Reframe  Validate/empathize  Related past to present  Comments  Adj. Disorder with Depression Meds.: none  Goals: Brenda Hudson lost her husband suddenly several years ago when he collapsed while jogging. She has struggled to adjust to this traumatic loss, which has left a devastating void in her life. She is hoping to use counseling as a way to re-define her life and find both  purpose and happiness. She relied on husband for many things and she tends to suffer from a lack of confidence. She also relied on him to co-parent their daughter, who has some special needs and presents her with multiple challenges. She is also seeking additional guidance on parenting this adult daughter. Needs to adjust to having her daughter live with her, likely for the long-term. Focus on treatment evolving to address the complicated and conflictual relationship with daughter Brenda Hudson. Brenda Hudson expresses many concerns/fears about this relationship, and is paralyzed by these fears and guilt in her efforts to effectively parent and set appropriate boundaries.  Goal date 12-25.   Patient agreed to a video Public affairs consultant) session and is aware of the limitations of the platform. She is at her home and I am at my home office.   Brenda Hudson had a conversation with her inlaws about their passive aggressive behavior. It was very difficult and they were very defensive at the start. Brenda Hudson had a difficult time introducing this topic with them, but she did it. Conversation was 1 1/2 hours. Ended with the in-laws saying they would think about what she said and Brenda Hudson setting boundary that she and her daughters will not be around them unless their inappropriate behavior stops. She did tell them she loves them and will be there for them, but this behavior has to stop. Brother in Estate manager/land agent spoke with them for three nights in a row to help them understand that their behavior had been inappropriate. We talked about her fatigue associated with child care. Told her that she cannot continue to take night duty with baby every night, Needs to pass some of the night duty to Brenda Hudson. She agrees, but is reluctant. Brenda Hudson's back is worse (increased pain).  CONI ALM KERNS, PhD Time:5:15p-6:00p 45 minutes

## 2024-09-27 ENCOUNTER — Ambulatory Visit (INDEPENDENT_AMBULATORY_CARE_PROVIDER_SITE_OTHER): Admitting: Family Medicine

## 2024-09-27 ENCOUNTER — Encounter: Payer: Self-pay | Admitting: Family Medicine

## 2024-09-27 VITALS — BP 112/62 | HR 68 | Temp 97.8°F | Ht 62.5 in | Wt 116.0 lb

## 2024-09-27 DIAGNOSIS — F411 Generalized anxiety disorder: Secondary | ICD-10-CM

## 2024-09-27 DIAGNOSIS — B9689 Other specified bacterial agents as the cause of diseases classified elsewhere: Secondary | ICD-10-CM | POA: Diagnosis not present

## 2024-09-27 DIAGNOSIS — J329 Chronic sinusitis, unspecified: Secondary | ICD-10-CM | POA: Diagnosis not present

## 2024-09-27 DIAGNOSIS — R0981 Nasal congestion: Secondary | ICD-10-CM

## 2024-09-27 LAB — POC COVID19 BINAXNOW: SARS Coronavirus 2 Ag: NEGATIVE

## 2024-09-27 MED ORDER — DOXYCYCLINE HYCLATE 100 MG PO TABS: 100.0000 mg | ORAL_TABLET | Freq: Two times a day (BID) | ORAL | 0 refills | Status: DC

## 2024-09-27 NOTE — Progress Notes (Signed)
 Phone [phone] In person visit   Subjective:   Brenda Hudson is a 73 y.o. year old very pleasant female patient who presents for/with See problem oriented charting Chief Complaint  Patient presents with   Sinus Problem    Sinus pressure, headache, yellow/green mucus, post nasal drip;     Past Medical History-  Patient Active Problem List   Diagnosis Date Noted   Spinal stenosis of lumbar region with neurogenic claudication 08/21/2019    Priority: High   PAC (premature atrial contraction) 09/10/2014    Priority: High   GAD (generalized anxiety disorder) 06/08/2022    Priority: Medium    Migraine 03/13/2020    Priority: Medium    Osteopenia 01/02/2020    Priority: Medium    Cervical spinal stenosis 08/21/2019    Priority: Medium    Generalized osteoarthritis of multiple sites 09/30/2021    Priority: Low   Headache, unspecified 08/21/2019    Priority: Low   PVC's (premature ventricular contractions) 04/04/2018    Priority: Low   Low back pain 09/30/2014    Priority: Low   IBS (irritable bowel syndrome) 09/30/2014    Priority: Low   Mitral valve prolapse 09/30/2014    Priority: Low   Palpitations 09/10/2014    Priority: Low   History of polymyalgia rheumatica 03/21/2008    Priority: Low   Injury of carotid artery 01/11/2024   Atrial tachycardia 06/08/2022    Medications- reviewed and updated Current Outpatient Medications  Medication Sig Dispense Refill   ACETAMINOPHEN PO Take by mouth as needed.     Cholecalciferol (VITAMIN D3) 50 MCG (2000 UT) CAPS Take by mouth.     doxycycline  (VIBRA -TABS) 100 MG tablet Take 1 tablet (100 mg total) by mouth 2 (two) times daily for 7 days. 14 tablet 0   flecainide  (TAMBOCOR ) 100 MG tablet TAKE 1 TABLET BY MOUTH TWICE A DAY 180 tablet 3   Ibuprofen 200 MG CAPS Take 400 mg by mouth in the morning and at bedtime.      loratadine (CLARITIN) 10 MG tablet Take 10 mg by mouth daily as needed for allergies.     MELATONIN PO  Take by mouth.     metoprolol  succinate (TOPROL -XL) 25 MG 24 hr tablet Take 1 tablet (25 mg total) by mouth daily. 90 tablet 3   sertraline  (ZOLOFT ) 25 MG tablet TAKE 1 TABLET (25 MG TOTAL) BY MOUTH DAILY. 90 tablet 2   No current facility-administered medications for this visit.     Objective:  BP 112/62 (BP Location: Left Arm, Patient Position: Sitting, Cuff Size: Normal)   Pulse 68   Temp 97.8 F (36.6 C) (Temporal)   Ht 5' 2.5 (1.588 m)   Wt 116 lb (52.6 kg)   SpO2 97%   BMI 20.88 kg/m  Gen: NAD, resting comfortably  HEENT: Turbinates erythematous with yellow drainage with nasal passageway blocked on left, TM normal, pharynx mildly erythematous with no tonsilar exudate or edema, mild frontal and moderate to severe maxillary sinus tenderness CV: RRR no murmurs rubs or gallops Lungs: CTAB no crackles, wheeze, rhonchi Ext: no edema Skin: warm, dry  Results for orders placed or performed in visit on 09/27/24 (from the past 24 hours)  POC COVID-19     Status: None   Collection Time: 09/27/24  9:17 AM  Result Value Ref Range   SARS Coronavirus 2 Ag Negative Negative      Assessment and Plan   #back pain- ongoing- considering surgery with Dr. Colon  but has to find the right time with the baby in the home  # Sinusitis concern S:patient reports symptom(s) started 6 days ago on saturday . She notes sinus pressure- mainly maxillary but some frontal, frontal headaches, green-yellow mucus as well as postnasal drip.trying mucinex -No shortness of breath or fever  -minimal cough  - after initial stabilization for a few days- worsened yesterday substantially  She has history of intermittent sinusitis-protracted course in early 2024-had about 50% improvement on Augmentin  7 days later tried 10 days of doxycycline - felt did better on this A/P: Sinus infection/Sinusitis Bacterial based on:  double sickening  Treatment: -symptomatic care with  Plain mucinex or mucinex- DM (if you  want to have something to help with cough as well) Sinus rinses like a Neti Pot or Neilmed sinus rinse (make sure to follow instructions for water preparation) Tylenol 650 mg every 6 hours to help with sinus pressure -Antibiotic indicated: yes: she did well with doxycycline  last year and we opted to retrial that this year  Finally, we reviewed reasons to return to care including if symptoms worsen or persist  (despite above treatments) or new concerns arise (particularly fever or shortness of breath) -Opting for COVID testing but declines flu testing- COVID negative   # Situational stress/GAD S: Medication: Sertraline  25 mg.  Continues work with Dr. Gutterman as well with last visit 09/06/2024 -Last visit she was in the process of moving - [place] went to [place] Granddaughter 4 months old    09/27/2024    9:09 AM 12/10/2022    4:09 PM 06/08/2022    4:26 PM 03/19/2022    2:11 PM  GAD 7 : Generalized Anxiety Score  Nervous, Anxious, on Edge 0 1 2 3   Control/stop worrying 0 0 2 2  Worry too much - different things 0 1 1 3   Trouble relaxing 0 0 0 1  Restless 0 0 0 0  Easily annoyed or irritable 0 0 0 0  Afraid - awful might happen 0 0  0  Total GAD 7 Score 0 2  9  Anxiety Difficulty Not difficult at all Not difficult at all Somewhat difficult Somewhat difficult   A/P: still with stress and now with lack of sleep with baby- but managing reasonably well- continue current medications    Recommended follow up: Return for as needed for new, worsening, persistent symptoms. Future Appointments  Date Time Provider Department Center  10/04/2024  1:40 PM [facility] ANNUAL WELLNESS VISIT 1 [facility]  10/18/2024  5:00 PM Coni Alm CROME, PhD [facility] None  11/01/2024  5:00 PM Coni Alm CROME, PhD [facility] None  11/29/2024  5:00 PM Coni Alm CROME, PhD [facility] None  12/13/2024  5:00 PM Coni Alm CROME, PhD [facility] None   01/04/2025 10:00 AM Katrinka Garnette KIDD, MD [facility]  01/10/2025  5:00 PM Coni Alm CROME, PhD [facility] None    Lab/Order associations:   ICD-10-CM   1. Bacterial sinusitis  J32.9    B96.89     2. Congestion of nasal sinus  R09.81 POC COVID-19    3. GAD (generalized anxiety disorder)  F41.1       Meds ordered this encounter  Medications   doxycycline  (VIBRA -TABS) 100 MG tablet    Sig: Take 1 tablet (100 mg total) by mouth 2 (two) times daily for 7 days.    Dispense:  14 tablet    Refill:  0  Return precautions advised.  Garnette Lukes, MD

## 2024-09-27 NOTE — Patient Instructions (Addendum)
 Sinus infection/Sinusitis Bacterial based on:  double sickening  Treatment: -symptomatic care with  Plain mucinex or mucinex- DM (if you want to have something to help with cough as well) Sinus rinses like a Neti Pot or Neilmed sinus rinse (make sure to follow instructions for water preparation) Tylenol 650 mg every 6 hours to help with sinus pressure -Antibiotic indicated: yes: she did well with doxycycline  last year and we opted to retrial that this year  Finally, we reviewed reasons to return to care including if symptoms worsen or persist  (despite above treatments) or new concerns arise (particularly fever or shortness of breath)  Recommended follow up: Return for as needed for new, worsening, persistent symptoms.

## 2024-10-05 ENCOUNTER — Other Ambulatory Visit: Payer: Self-pay | Admitting: Family Medicine

## 2024-10-18 ENCOUNTER — Ambulatory Visit: Admitting: Psychology

## 2024-10-23 ENCOUNTER — Ambulatory Visit

## 2024-10-31 ENCOUNTER — Ambulatory Visit: Admitting: Neurology

## 2024-11-01 ENCOUNTER — Ambulatory Visit: Admitting: Psychology

## 2024-11-20 ENCOUNTER — Ambulatory Visit

## 2024-11-22 ENCOUNTER — Telehealth: Payer: Self-pay

## 2024-11-22 NOTE — Telephone Encounter (Signed)
 Called and spoke with patient -patient advised of colon recall-patient agreeable to plan of care and has been scheduled for PV and colon; Patient advised to call back to the office at [phone] should questions/concerns arise;  Patient verbalized understanding of information/instructions;

## 2024-11-27 ENCOUNTER — Ambulatory Visit: Admitting: Psychology

## 2024-11-27 DIAGNOSIS — F4321 Adjustment disorder with depressed mood: Secondary | ICD-10-CM

## 2024-11-27 NOTE — Progress Notes (Signed)
 "                                                                                                                                                                                                                                                                                                                                                                                                           CLANCY LEINER is a 74 y.o. female patient    Date: 11/27/2024  Treatment Plan: Diagnosis V62.82 (Uncomplicated bereavement) [n/a]  F43.21 (Adjustment Disorder, With depressed mood) [n/a]  F41.1 (Generalized anxiety disorder) [n/a]  Symptoms Excessive and/or unrealistic worry that is difficult to control occurring more days than not for at least 6 months about a number of events or activities. (Status: maintained) -- No Description Entered  Strong emotional response of sadness exhibited when losses are discussed. (Status: maintained) -- No Description Entered  Thoughts dominated by loss coupled with poor concentration, tearful spells, and confusion about the future. (Status: maintained) -- No Description Entered  Medication Status na  Safety none  If Suicidal or Homicidal State Action Taken: unspecified  Current Risk: low Medications unspecified Objectives Related Problem: Complete the process of letting go of the lost significant other. Description: Verbalize resolution of feelings of guilt and regret associated with the loss. Target Date: 2025-10-23 Frequency: Daily Modality: individual Progress: 90%  Related  Problem: Complete the process of letting go of the lost significant other. Description: Participate in a therapy that addresses issues beyond grief that have arisen as a result of the loss. Target Date: 2023-10-24 Frequency:  Daily Modality: individual Progress: 100%  Related Problem: Learn and implement coping skills that result in a reduction of anxiety and worry, and improved daily functioning. Description: Learn and implement problem-solving strategies for realistically addressing worries. Target Date: 2025-10-23 Frequency: Daily Modality: individual Progress: 90%  Related Problem: Learn and implement coping skills that result in a reduction of anxiety and worry, and improved daily functioning. Description: Learn and implement calming skills to reduce overall anxiety and manage anxiety symptoms. Target Date: 2025-10-23 Frequency: Daily Modality: individual Progress: 90%  Related Problem: Learn and implement coping skills that result in a reduction of anxiety and worry, and improved daily functioning. Description: Describe situations, thoughts, feelings, and actions associated with anxieties and worries, their impact on functioning, and attempts to resolve them. Target Date: 2025-10-23 Frequency: Daily Modality: individual Progress: 90%  Client Response full compliance  Service Location Location, [address] [place], [place] [address]  Service Code cpt [id number]  Lifestyle change (exercise, nutrition)  Self care activities  Provide education, information  Identify/label emotions  Facilitate problem solving  Normalize/Reframe  Validate/empathize  Related past to present  Comments  Adj. Disorder with Depression Meds.: none  Goals: Raina lost her husband suddenly several years ago when he collapsed while jogging. She has struggled to adjust to this traumatic loss, which has left a devastating void in her life. She is hoping to use counseling as a way to re-define her  life and find both purpose and happiness. She relied on husband for many things and she tends to suffer from a lack of confidence. She also relied on him to co-parent their daughter, who has some special needs and presents her with multiple challenges. She is also seeking additional guidance on parenting this adult daughter. Needs to adjust to having her daughter live with her, likely for the long-term. Focus on treatment evolving to address the complicated and conflictual relationship with daughter Kit. Ether expresses many concerns/fears about this relationship, and is paralyzed by these fears and guilt in her efforts to effectively parent and set appropriate boundaries.  Goal date 12-26. Additional goal is to adjust to having a baby in the household and her ability to be the primary care-taker. Goal date 12-26.    Patient agreed to a video Public Affairs Consultant) session and is aware of the limitations of the platform. She is at her home and I am at my home office.   Rozelle says she is totally exhausted taking care of her granddaughter. Says that Kit doesn't have the stamina for full time parenting, which means Donny is doing all of the night care. Her back is still bad and lifting the baby is difficult. Donny says she allows Kit to rely on her too much, so even on the days when Kit works part time. Kit has never taken care of the baby all day by herself. One of the current issues in her life is that her sister in law is problematic, difficult to get along with and irrational. She tried to get together with them before Christmas because she did not plan to be with them for Christmas. She called Donny the week before Christmas and started yelling at her saying she had not done or said the sings they told her she had said. Donny says she basically hung up on her. She says that she and Kit's personal relationship has improved and conflict is less.  CONI ALM KERNS, PhD Time:5:15p-6:00p 45 minutes "

## 2024-11-29 ENCOUNTER — Ambulatory Visit: Admitting: Psychology

## 2024-12-04 ENCOUNTER — Telehealth: Payer: Self-pay

## 2024-12-04 NOTE — Telephone Encounter (Signed)
 Dr. Shila, Please review this patient's chart.  I called the patient and got her scheduled for a recall colon as it was in [facility] for 2025 but I see that she had one completed 11/24/2018 with dx of ACUTE NON-SPECIFIC COLITIS and it was noted a 10 year recall, previous colonoscopy was done 08/08/2014 with a 10 year recall; Please advise if the patient needs to stay as scheduled or if she can be placed on 2030 recall list?  Please/thank you Bre, PV RN

## 2024-12-06 NOTE — Telephone Encounter (Signed)
 Please schedule office visit with me to discuss. Thanks

## 2024-12-07 NOTE — Telephone Encounter (Signed)
 Attempted to reach patient concerning previous notes-unable to speak with patient-left message for patient to call back to the office for this information-needing to reschedule PV to an OV with Dr.Nandigam;

## 2024-12-11 NOTE — Telephone Encounter (Signed)
 Called and spoke with patient- patient is agreeable to come into the office and talk with provider concerning need for further colonoscopies; OV appt made with patient at time of call; Patient advised to call back to the office at [phone] should questions/concerns arise; Patient verbalized understanding of information/instructions;

## 2024-12-13 ENCOUNTER — Ambulatory Visit: Admitting: Psychology

## 2024-12-17 ENCOUNTER — Encounter

## 2024-12-19 ENCOUNTER — Other Ambulatory Visit: Payer: Self-pay | Admitting: Physician Assistant

## 2024-12-19 DIAGNOSIS — I4719 Other supraventricular tachycardia: Secondary | ICD-10-CM

## 2024-12-19 DIAGNOSIS — R002 Palpitations: Secondary | ICD-10-CM

## 2024-12-21 NOTE — Telephone Encounter (Signed)
 Patient overdue for an appt, LVMTCB to schedule. 30 day supply sent as 1st attempt. Please contact pt to schedule appt.

## 2024-12-27 ENCOUNTER — Ambulatory Visit: Admitting: Psychology

## 2025-01-04 ENCOUNTER — Encounter: Admitting: Family Medicine

## 2025-01-07 ENCOUNTER — Ambulatory Visit: Admitting: Nurse Practitioner

## 2025-01-10 ENCOUNTER — Ambulatory Visit: Admitting: Psychology

## 2025-01-24 ENCOUNTER — Ambulatory Visit: Admitting: Psychology

## 2025-02-04 ENCOUNTER — Encounter: Admitting: Gastroenterology

## 2025-02-21 ENCOUNTER — Ambulatory Visit: Admitting: Psychology

## 2025-03-07 ENCOUNTER — Ambulatory Visit: Admitting: Psychology
